# Patient Record
Sex: Male | Born: 1995 | Race: Black or African American | Hispanic: No | Marital: Single | Smoking: Current every day smoker
Health system: Southern US, Community
[De-identification: ages and names within clinical notes are randomized; demographics above are authoritative.]

## PROBLEM LIST (undated history)

## (undated) ENCOUNTER — Emergency Department (HOSPITAL_COMMUNITY): Admission: EM | Payer: Medicaid Other | Source: Home / Self Care

## (undated) DIAGNOSIS — F209 Schizophrenia, unspecified: Secondary | ICD-10-CM

## (undated) HISTORY — PX: WISDOM TOOTH EXTRACTION: SHX21

## (undated) HISTORY — PX: OTHER SURGICAL HISTORY: SHX169

---
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
## 2018-02-11 ENCOUNTER — Emergency Department (HOSPITAL_COMMUNITY): Admission: EM | Admit: 2018-02-11 | Discharge: 2018-02-11 | Discharge status: Against Medical Advice | Payer: Self-pay

## 2018-02-11 ENCOUNTER — Emergency Department (HOSPITAL_COMMUNITY)
Admission: EM | Admit: 2018-02-11 | Discharge: 2018-02-13 | Disposition: A | Discharge status: Other Acute Inpatient Hospital | Payer: Self-pay | Attending: Emergency Medicine | Admitting: Emergency Medicine

## 2018-02-11 ENCOUNTER — Other Ambulatory Visit: Payer: Self-pay

## 2018-02-11 ENCOUNTER — Encounter (HOSPITAL_COMMUNITY): Payer: Self-pay | Admitting: Emergency Medicine

## 2018-02-11 DIAGNOSIS — F29 Unspecified psychosis not due to a substance or known physiological condition: Secondary | ICD-10-CM

## 2018-02-11 LAB — CBC
HEMATOCRIT: 43.7 % (ref 39.0–52.0)
Hemoglobin: 14.3 g/dL (ref 13.0–17.0)
MCH: 26.8 pg (ref 26.0–34.0)
MCHC: 32.7 g/dL (ref 30.0–36.0)
MCV: 82 fL (ref 78.0–100.0)
Platelets: 274 10*3/uL (ref 150–400)
RBC: 5.33 MIL/uL (ref 4.22–5.81)
RDW: 13.2 % (ref 11.5–15.5)
WBC: 5.3 10*3/uL (ref 4.0–10.5)

## 2018-02-11 LAB — RAPID URINE DRUG SCREEN, HOSP PERFORMED
AMPHETAMINES: NOT DETECTED
BARBITURATES: NOT DETECTED
BENZODIAZEPINES: NOT DETECTED
Cocaine: NOT DETECTED
Opiates: NOT DETECTED
Tetrahydrocannabinol: POSITIVE — AB

## 2018-02-11 LAB — COMPREHENSIVE METABOLIC PANEL
ALBUMIN: 4.3 g/dL (ref 3.5–5.0)
ALT: 28 U/L (ref 17–63)
AST: 22 U/L (ref 15–41)
Alkaline Phosphatase: 63 U/L (ref 38–126)
Anion gap: 9 (ref 5–15)
BUN: 15 mg/dL (ref 6–20)
CHLORIDE: 107 mmol/L (ref 101–111)
CO2: 23 mmol/L (ref 22–32)
CREATININE: 2 mg/dL — AB (ref 0.61–1.24)
Calcium: 10 mg/dL (ref 8.9–10.3)
GFR calc non Af Amer: 47 mL/min — ABNORMAL LOW (ref >60)
GFR, EST AFRICAN AMERICAN: 54 mL/min — AB (ref >60)
GLUCOSE: 116 mg/dL — AB (ref 65–99)
Potassium: 3.7 mmol/L (ref 3.5–5.1)
SODIUM: 139 mmol/L (ref 135–145)
Total Bilirubin: 1.9 mg/dL — ABNORMAL HIGH (ref 0.3–1.2)
Total Protein: 8.1 g/dL (ref 6.5–8.1)

## 2018-02-11 LAB — ETHANOL: Alcohol, Ethyl (B): <10 mg/dL (ref <10)

## 2018-02-11 MED ORDER — ONDANSETRON HCL 4 MG PO TABS: 4.0000 mg | ORAL_TABLET | Freq: Three times a day (TID) | ORAL | Status: DC | PRN

## 2018-02-11 MED ORDER — LORAZEPAM 2 MG/ML IJ SOLN
1.0000 mg | INTRAMUSCULAR | Status: AC | PRN
  Administered 2018-02-12: 1 mg via INTRAMUSCULAR
  Filled 2018-02-11: qty 1

## 2018-02-11 MED ORDER — SODIUM CHLORIDE 0.9 % IV BOLUS
1000.0000 mL | Freq: Once | INTRAVENOUS | Status: AC
  Administered 2018-02-12: 1000 mL via INTRAVENOUS

## 2018-02-11 MED ORDER — ALUM & MAG HYDROXIDE-SIMETH 200-200-20 MG/5ML PO SUSP: 30.0000 mL | Freq: Four times a day (QID) | ORAL | Status: DC | PRN

## 2018-02-11 MED ORDER — ACETAMINOPHEN 325 MG PO TABS: 650.0000 mg | ORAL_TABLET | ORAL | Status: DC | PRN

## 2018-02-11 NOTE — ED Provider Notes (Signed)
[facility] EMERGENCY DEPARTMENT Provider Note   CSN: [id number] Arrival date & time: 02/11/18  2137     History   Chief Complaint Chief Complaint  Patient presents with  . Medical Clearance    IVC    HPI Gregory Saunders is a 22 y.o. male with no major medical history presents to the Emergency Department accompanied by his father.  Patient is able to tell me his name but is unable to oriented to time or place.  He is unable to give coherent answers.  When asked why he is here patient begins to ramble.  He states that he has "helped people" but is unable to elaborate.  He states he has been talking to God and feels as if he is being embraced.  He denies alcohol or cigarette usage.  He does report daily marijuana usage.  He denies a change in his drug dealer.  He denies suicidal ideation, homicidal ideation, auditory or visual hallucinations.  5 caveat for psychiatric.   Remainder of the history is provided by father.  Father reports that patient attends [employer].  Father states when he picked his son up from school 2-1/2 weeks ago he was acting normally.  There were no reports of difficulty with this semester at school.  Father reports the last week patient has become evasive, agitated and argumentative.  Father reports the patient is talking to himself and is not making any sense.  He reports that on Monday, the patient took his truck and disappeared for 3 days.  Father reports that patient has been at his girlfriends smoking marijuana.  Father reports that the girlfriend states patient has been talking to himself and acting strangely.  Father denies known psychiatric or medical history.   The history is provided by the patient, medical records and a parent. No language interpreter was used.    History reviewed. No pertinent past medical history.  There are no active problems to display for this patient.   History reviewed. No pertinent surgical  history.      Home Medications    Prior to Admission medications   Not on File    Family History No family history on file.  Social History Social History   Tobacco Use  . Smoking status: Never Smoker  . Smokeless tobacco: Never Used  Substance Use Topics  . Alcohol use: Never    Frequency: Never  . Drug use: Yes    Types: Marijuana     Allergies   Penicillins   Review of Systems Review of Systems  Unable to perform ROS: Psychiatric disorder     Physical Exam Updated Vital Signs BP (!) 152/101 (BP Location: Right Arm)   Pulse 76   Temp 99.1 F (37.3 C) (Oral)   Resp 16   SpO2 100%   Physical Exam  Constitutional: He appears well-developed and well-nourished. No distress.  HENT:  Head: Normocephalic.  Eyes: Conjunctivae are normal. No scleral icterus.  Neck: Normal range of motion.  Cardiovascular: Normal rate and intact distal pulses.  Pulmonary/Chest: Effort normal.  Clear and equal  Abdominal:  Soft and nontender  Musculoskeletal: Normal range of motion. He exhibits no edema.  Neurological: He is alert. GCS eye subscore is 4. GCS verbal subscore is 4. GCS motor subscore is 6.  Patient able to oriented to person alone.  Unable to orient to place, time or event.  Skin: Skin is warm and dry.  Psychiatric: His speech is normal and  behavior is normal. His affect is blunt. He is not actively hallucinating. Cognition and memory are impaired. He expresses no homicidal and no suicidal ideation. He expresses no suicidal plans and no homicidal plans.  Confused answers  Nursing note and vitals reviewed.    ED Treatments / Results  Labs (all labs ordered are listed, but only abnormal results are displayed) Labs Reviewed  COMPREHENSIVE METABOLIC PANEL - Abnormal; Notable for the following components:      Result Value   Glucose, Bld 116 (*)    Creatinine, Ser 2.00 (*)    Total Bilirubin 1.9 (*)    GFR calc non Af Amer 47 (*)    GFR calc Af Amer 54 (*)     All other components within normal limits  RAPID URINE DRUG SCREEN, HOSP PERFORMED - Abnormal; Notable for the following components:   Tetrahydrocannabinol POSITIVE (*)    All other components within normal limits  URINALYSIS, ROUTINE W REFLEX MICROSCOPIC - Abnormal; Notable for the following components:   Color, Urine AMBER (*)    Specific Gravity, Urine 1.036 (*)    Ketones, ur 5 (*)    Protein, ur 30 (*)    All other components within normal limits  ETHANOL  CBC     Procedures Procedures (including critical care time)  Medications Ordered in ED Medications  acetaminophen (TYLENOL) tablet 650 mg (has no administration in time range)  ondansetron (ZOFRAN) tablet 4 mg (has no administration in time range)  alum & mag hydroxide-simeth (MAALOX/MYLANTA) 200-200-20 MG/5ML suspension 30 mL (has no administration in time range)  LORazepam (ATIVAN) injection 1 mg (1 mg Intramuscular Given 02/12/18 0130)  sodium chloride 0.9 % bolus 1,000 mL (0 mLs Intravenous Stopped 02/12/18 0130)  ziprasidone (GEODON) injection 20 mg (20 mg Intramuscular Given 02/12/18 0155)  sterile water (preservative free) injection (1.2 mLs  Given 02/12/18 0155)     Initial Impression / Assessment and Plan / ED Course  I have reviewed the triage vital signs and the nursing notes.  Pertinent labs & imaging results that were available during my care of the patient were reviewed by me and considered in my medical decision making (see chart for details).  Clinical Course as of Feb 12 709  Fri Feb 11, 2018  2316 Elevated.  Unknown baseline  Creatinine(!): 2.00 [HM]  2316 Hypertensive.  No known diagnosis of same.  BP(!): 152/101 [HM]  2316 Elevated.  Abdomen soft and nontender.  No reports of emesis.  Total Bilirubin(!): 1.9 [HM]  2317 No leukocytosis.  WBC: 5.3 [HM]  2345 Discussed with Ala Dach of [facility].  Pt meets inpatient criteria.   [HM]  Sat Feb 12, 2018  0115 Improved  BP(!): 144/96 [HM]  0139 Called to  patient bedside after patient became agitated and aggressive.  He removed his own IV.  Pt given Ativan  without improvement.  Pt with soft restraints placed in my presence and Geodon ordered.     [HM]  0300 Patient is much calmer after Geodon.  Will consider removal of soft restraints.   [HM]    Clinical Course User Index [HM] Arijana Narayan, Boyd Kerbs      Presents with psychiatric complaints.  He is able to orient to person only.  Labs with elevated serum creatinine and increased total bilirubin.  Patient without abdominal pain, nausea or vomiting.  No elevation in AST/ALT.  Doubt cholecystitis, cholangitis.  Patient is hypertensive without history of same.  No elevation in BUN.  No chest pain or  shortness of breath.  Patient is afebrile without psychosis.  No evidence of sirs or sepsis.  Drug screen positive for marijuana.  No alcohol in the patient's system.  Will give fluids.  Patient does meet inpatient criteria for behavioral health.  At this time there are no emergent conditions which would prevent patient from obtaining psychiatric treatment.   Final Clinical Impressions(s) / ED Diagnoses   Final diagnoses:  Psychosis, unspecified psychosis type [facility])    ED Discharge Orders    None       Mardene Sayer Boyd Kerbs 02/12/18 1096    Pricilla Loveless, MD 02/12/18 [phone]

## 2018-02-11 NOTE — ED Triage Notes (Signed)
Brought to ed by parents and [employer].  Per IVC paperwork patient is talking to himself, when asked whey he didn't call his mother states I prayed to God that's how I called her.  Also combative with father trying to take his car keys.  Admits to smoking marijuana everyday last week and this week.  Per [employer] acting peculiar darting eyes back and forth from door as if he is going to run.  Family at the bedside.
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
## 2018-02-12 ENCOUNTER — Other Ambulatory Visit: Payer: Self-pay

## 2018-02-12 LAB — URINALYSIS, ROUTINE W REFLEX MICROSCOPIC
Bacteria, UA: NONE SEEN
Bilirubin Urine: NEGATIVE
Glucose, UA: NEGATIVE mg/dL
HGB URINE DIPSTICK: NEGATIVE
Ketones, ur: 5 mg/dL — AB
LEUKOCYTES UA: NEGATIVE
NITRITE: NEGATIVE
PH: 5 (ref 5.0–8.0)
Protein, ur: 30 mg/dL — AB
SPECIFIC GRAVITY, URINE: 1.036 — AB (ref 1.005–1.030)

## 2018-02-12 MED ORDER — HALOPERIDOL 5 MG PO TABS
10.0000 mg | ORAL_TABLET | Freq: Four times a day (QID) | ORAL | Status: DC | PRN
  Administered 2018-02-12: 10 mg via ORAL
  Administered 2018-02-13: 5 mg via ORAL
  Filled 2018-02-12 (×2): qty 2

## 2018-02-12 MED ORDER — STERILE WATER FOR INJECTION IJ SOLN
INTRAMUSCULAR | Status: AC
  Administered 2018-02-12: 1.2 mL
  Filled 2018-02-12: qty 10

## 2018-02-12 MED ORDER — DIPHENHYDRAMINE HCL 25 MG PO CAPS
25.0000 mg | ORAL_CAPSULE | Freq: Four times a day (QID) | ORAL | Status: DC | PRN
  Administered 2018-02-12 – 2018-02-13 (×3): 25 mg via ORAL
  Filled 2018-02-12 (×3): qty 1

## 2018-02-12 MED ORDER — HALOPERIDOL LACTATE 5 MG/ML IJ SOLN
5.0000 mg | Freq: Once | INTRAMUSCULAR | Status: AC
  Administered 2018-02-12: 5 mg via INTRAMUSCULAR
  Filled 2018-02-12: qty 1

## 2018-02-12 MED ORDER — LORAZEPAM 2 MG/ML IJ SOLN
2.0000 mg | Freq: Four times a day (QID) | INTRAMUSCULAR | Status: DC | PRN
  Administered 2018-02-13: 2 mg via INTRAMUSCULAR
  Filled 2018-02-12: qty 1

## 2018-02-12 MED ORDER — OLANZAPINE 5 MG PO TABS
5.0000 mg | ORAL_TABLET | Freq: Every day | ORAL | Status: DC
  Administered 2018-02-13: 5 mg via ORAL
  Filled 2018-02-12: qty 1

## 2018-02-12 MED ORDER — LORAZEPAM 1 MG PO TABS
2.0000 mg | ORAL_TABLET | Freq: Four times a day (QID) | ORAL | Status: DC | PRN
  Administered 2018-02-12: 2 mg via ORAL
  Administered 2018-02-13: 1 mg via ORAL
  Filled 2018-02-12 (×2): qty 2

## 2018-02-12 MED ORDER — ZIPRASIDONE MESYLATE 20 MG IM SOLR
20.0000 mg | Freq: Once | INTRAMUSCULAR | Status: AC
  Administered 2018-02-12: 20 mg via INTRAMUSCULAR
  Filled 2018-02-12: qty 20

## 2018-02-12 MED ORDER — LORAZEPAM 2 MG/ML IJ SOLN
2.0000 mg | Freq: Once | INTRAMUSCULAR | Status: AC
  Administered 2018-02-12: 2 mg via INTRAMUSCULAR
  Filled 2018-02-12: qty 1

## 2018-02-12 MED ORDER — HALOPERIDOL LACTATE 5 MG/ML IJ SOLN
10.0000 mg | Freq: Four times a day (QID) | INTRAMUSCULAR | Status: DC | PRN
  Administered 2018-02-13: 10 mg via INTRAMUSCULAR
  Filled 2018-02-12: qty 2

## 2018-02-12 MED ORDER — DIPHENHYDRAMINE HCL 50 MG/ML IJ SOLN: 25.0000 mg | Freq: Four times a day (QID) | INTRAMUSCULAR | Status: DC | PRN

## 2018-02-12 NOTE — ED Notes (Signed)
Gregory Saunders (Mother) [phone]

## 2018-02-12 NOTE — ED Notes (Signed)
Pt mother came to visit. Mother informed pt is sleeping at the time and has been sedated. She is allowed to go into the room however is not allowed to wake pt. Mother verbalized understanding. Mother explained the pt is to only call her or the father and staff should dial the number.  In regards to placement, mother endorses placement anywhere in the state. She says " I want my son to be in the right place getting the right treatment. "   Mother cell: [phone] Father cell: [phone]  The mother requests to please keep her informed.

## 2018-02-12 NOTE — ED Notes (Signed)
Called dinner order in to [facility].

## 2018-02-12 NOTE — BH Assessment (Addendum)
Writer reassessed patient this am. He was pleasant but not oriented. He knew his birthday but did not know month or date. He says he slept well and ate breakfast. Pt says, "I got my energy back." He shakes his head no when asked if he is suicidal. He answers " no" when asked if he is homicidal. Pt's replies don't match the writer's questions.  He asks Clinical research associate when can he leave and when will his clothes be returned.  Per Assunta Found, NP pt continues to meet inpatient MH treatment. Rankin NP recommends starting pt asap on Zyprexa Zydis 5 mg qhs. He will also need a EKG to determine baseline.  Writer notified pt's Child psychotherapist. TTS seeking placement.  Evette Cristal, [place] Therapeutic Triage Specialist

## 2018-02-12 NOTE — ED Notes (Signed)
Pt attempting to leave ED - Dr Charm Barges aware - pt refusing to return to room even after much encouragement from Security and Off-Duty [employer]. Orders received for Haldol  IM and Ativan  IM. When pt did return to room, tolerated injections well. Refused for them to be placed anywhere except deltoids. Pt continues to state he wants to leave and attempting to leave room.
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
## 2018-02-12 NOTE — ED Provider Notes (Signed)
Received a call about Gregory Saunders patient in pod F who is on an IVC with psychiatric bed search for new onset delusional behavior.  He had received Geodon at around 9 this morning but he again is becoming agitated and restless.  I have ordered some Haldol and Ativan IM.    Terrilee Files, MD 02/14/18 1044

## 2018-02-12 NOTE — ED Notes (Signed)
Pt continued to attempt to leave. In addition to security and Off-Duty [employer], 2 [employer] Officers assisting w/de-escalating pt. Pt returned to room and talked w/Officer. Pt now lying on bed - resting quietly - eyes closed. Respirations even, unlabored.

## 2018-02-12 NOTE — ED Notes (Signed)
Tamika, NP, [facility] - called w/med recommendations - Haldol  po or IM, Ativan  po or IM, and Benadryl  po or IM every 6 hours prn - agitation. Dr Charm Barges aware and entered orders.

## 2018-02-12 NOTE — ED Notes (Signed)
Pt now awake - continues to state he wants to leave. Encouraged pt to be calm, cooperative. Warm blankets, juice, and sandwich given.
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
## 2018-02-12 NOTE — ED Notes (Signed)
Pt awake - ate few bites of lunch and drank Sprite. Encouraged pt to eat more - declined. Pt refusing to stay in room.

## 2018-02-12 NOTE — ED Notes (Signed)
Pt's mother called inquiring about pt. Voiced understanding of visitation times.

## 2018-02-12 NOTE — ED Notes (Signed)
RN spoke with patient mother to inform of patient placed in restraints and meds given to help control patient's violent behavior; Mother states that patient has had some aggressive behavior at home and feels he can do what he wants; mother states patient behavior is not normal for him and feels he is confused; Mother states patient admits to taking CBC oil and smoking weed this week-Monique,RN

## 2018-02-12 NOTE — ED Notes (Signed)
Patient's family wanted it noted in patient chart that he also admitted to taking CB4 (spice)-Monique,RN

## 2018-02-12 NOTE — ED Notes (Signed)
Re-TTS being performed.  

## 2018-02-12 NOTE — ED Notes (Addendum)
Pt attempted to leave ED - was escorted back to room w/much encouragement from staff, Security, and Off-Duty [employer]. Dr Freida Busman aware - order received for Geodon  IM. Pt sat on bed - tolerated well.

## 2018-02-12 NOTE — Progress Notes (Signed)
Patient meets criteria for inpatient treatment. There are currently no appropriate beds available at [facility] per [place], [facility]. CSW faxed referrals to the following facilities for review.  [place], [address], [place], [address], [address], [place], [address] [place], [place], [place], [place], [address], [place], [place], [place], [place], [place], and [place].   TTS will continue to seek bed placement.     Moss Mc, MSW, LCSW-A, LCAS-A 02/12/2018 12:15 PM

## 2018-02-12 NOTE — ED Notes (Signed)
ED Provider at bedside to assess patient combativness-Monique,RN

## 2018-02-12 NOTE — ED Notes (Signed)
Harle Battiest, pt's mother sts she wanted to check on pt. Informed her pt is asleep.

## 2018-02-12 NOTE — ED Notes (Signed)
Pt ambulated to nurses' desk and looking out door to Pod A. Requested for pt to return to room. Pt continued to stand at desk. Pt given magazines. Pt took them to his room then returned them to nurse.

## 2018-02-12 NOTE — ED Notes (Addendum)
Pt awake now - pt noted to be standing in hallway attempting to tie his torn shirt. Sitter and RN offered pt new shirt. Pt changed. Pt states he remembers everything that occurred last pm - states "I know they were trying to hold me down and I wasn't going to let them". Pt noted to be laughing. 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 States he was brought to ED d/t "they said I was having delusions". Denies experiencing any this am nor AH/VH. Pt noted to be alert, oriented x 4. Pt attempts to stand in hallway - redirected to room. Pt noted to be compliant. Pt sitting on chair in room talking w/Sitter. Pt noted w/excessive speech and appears to be slow to respond.

## 2018-02-13 MED ORDER — OLANZAPINE 5 MG PO TABS: 5.0000 mg | ORAL_TABLET | Freq: Every day | ORAL | Status: DC

## 2018-02-13 MED ORDER — OLANZAPINE 5 MG PO TABS: 7.5000 mg | ORAL_TABLET | Freq: Every day | ORAL | Status: DC

## 2018-02-13 MED ORDER — OLANZAPINE 5 MG PO TABS
5.0000 mg | ORAL_TABLET | Freq: Every day | ORAL | Status: DC
  Administered 2018-02-13: 5 mg via ORAL
  Filled 2018-02-13: qty 1
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
## 2018-02-13 NOTE — ED Notes (Signed)
NO belongings in ED for pt - verified w/mother she took them upon arrival to ED.

## 2018-02-13 NOTE — ED Notes (Addendum)
Pt returned to room after much encouragement. Pt ate breakfast and is now eating sandwich. Pt had hugged this RN. Stating "I just want to give you a hug". Advised pt RN and other staff prefer "bump fists" - pt then bumped fists w/RN. Pt had then approached Sitter in a quicker manner attempting to hug her. Sitter backed up and advised pt to please not touch her. RN asked pt to "bump fists". Pt then did this w/Sitter. Pt then sat in Sitter's chair outside of his room and attempted to type on computer. RN gave pt sandwich - pt then got up and sat on his bed and began eating.

## 2018-02-13 NOTE — ED Notes (Signed)
[employer] present to take pt to [facility] - Pt attempting to take soiled linen bag w/him. Security assisting w/removing from pt.
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
## 2018-02-13 NOTE — ED Notes (Signed)
Attempted to call report to [facility] - [phone] - pt accepted by Dr Otelia Santee. Requested for this RN to call back in 10 minutes.

## 2018-02-13 NOTE — Consult Note (Signed)
  Spoke with Kriste Basque, RN who reported that patient continues to be combative and no improvement in psychosis.  Current medications: Haldol 10 mg (PO/IM) with Ativan 2 mg (PO/IM) and Benadryl 25 mg (PO/IM) Q 6 hr prn agitation Zyprexa 5 mg Q hs Changes made to Zyprexa:  Zyprexa 5 mg Q morning and 7.5 mg Q hs.   Shuvon B. Rankin, NP

## 2018-02-13 NOTE — ED Notes (Signed)
Male Engineer, materials @ bedside, pt constantly attempting to engage her in conversation, she did not speak to pt. Pt decided to give her a hug and officer seemed to be in distress but did not make any attempts to tell him to not touch her.

## 2018-02-13 NOTE — ED Notes (Signed)
Pt Lunch order placed. 

## 2018-02-13 NOTE — Progress Notes (Signed)
Patient has been accepted to [facility]. The reporting number is [id number]. The accepting doctor is Dr. Malvin Johns. They can accept him before 2 PM or after 3:30 PM. CSW has notified patient's nurse Becky. CSW has faxed the IVC paperwork and facesheet to [facility].  Moss Mc MSW, LCSW-A, LCAS-A Clinical Social Worker 02/13/2018 12:36 PM

## 2018-02-13 NOTE — ED Notes (Signed)
Pt in shower. Pt had asked Sitter to remain in shower w/him. Advised he may shower and Sitter will stand by door.

## 2018-02-13 NOTE — Progress Notes (Signed)
Patient meets criteria for inpatient treatment. There are currently no appropriate beds available at [facility] per [place]. CSW faxed referrals to the following facilities for review.  [place], [address], [place], [address], [address], [place], [address], [place], [place], [place], [address], [place], [place], [place], [place], [place], and [place].   TTS will continue to seek bed placement.   Moss Mc MSW, LCSW-A, LCAS-A Clinical Social Worker 02/13/2018 8:02 AM

## 2018-02-13 NOTE — ED Notes (Signed)
Pt awake - ambulated to nurses' desk - refusing to return to room. Offered for pt to eat his breakfast. States he will but refuses to go back to room. Shuvon, NP, [facility], aware - order received for Zyprexa  daily and Zyprexa 7.5mg  qhs. Pt called his mother - stating he wants to leave now. Security and [employer] present. Pt talking w/Security. Pt took Zyprexa po w/o difficulty.

## 2018-02-13 NOTE — ED Notes (Signed)
RN called pt's mother, Martie Lee - [phone] - and advised pt has been accepted to [facility].

## 2018-02-13 NOTE — ED Notes (Signed)
Pt given snack. 

## 2018-08-24 ENCOUNTER — Emergency Department
Admission: EM | Admit: 2018-08-24 | Discharge: 2018-08-24 | Disposition: A | Discharge status: Home / Self Care | Payer: Self-pay | Attending: Emergency Medicine | Admitting: Emergency Medicine

## 2018-08-24 ENCOUNTER — Other Ambulatory Visit: Payer: Self-pay

## 2018-08-24 DIAGNOSIS — Z7689 Persons encountering health services in other specified circumstances: Secondary | ICD-10-CM | POA: Insufficient documentation

## 2018-08-24 MED ORDER — PALIPERIDONE PALMITATE ER 156 MG/ML IM SUSY
156.0000 mg | PREFILLED_SYRINGE | Freq: Once | INTRAMUSCULAR | Status: AC
  Administered 2018-08-24: 156 mg via INTRAMUSCULAR
  Filled 2018-08-24: qty 1

## 2018-08-24 NOTE — ED Provider Notes (Signed)
[facility] Emergency Department Provider Note  Time seen: 10:09 PM  I have reviewed the triage vital signs and the nursing notes.   HISTORY  Chief Complaint Psychiatric Evaluation    HPI Gregory Saunders is a 22 y.o. male with a past medical history of possible schizophrenia presents to the emergency department for psychiatric evaluation.  I spoke to mom first who states that the patient has been living in [place] going to school, has been calling and texting people estranged hours of the night and has not been sleeping very much.  Mom confronted the patient about this and the patient admitted to stopping his medication several weeks ago because he did not like how it made him feel.  Mom picked the patient from [place] and brought him to the emergency department after speaking [facility].  Patient is followed [facility] but they will not be reopened until Monday, and mom felt that he needed his medication quicker than that.  Mom does admit that the patient is very compliant at this time with her request, states in May the patient became extremely erratic had to be IVC.  Currently mom states if the patient could just get his medication she would be happy and will take him home and have him follow-up with [facility] on Monday.  I discussed with the patient he has no SI, no HI, denies any recreational drugs or alcohol use.  Has no medical concerns.  Patient admits to not taking his medication.  States he only takes fish oil because it is natural.  Denies any auditory visual hallucinations.   No past medical history on file.  There are no active problems to display for this patient.   No past surgical history on file.  Prior to Admission medications   Not on File    Allergies  Allergen Reactions  . Penicillins Other (See Comments)    Unknown     No family history on file.  Social History Social History   Tobacco Use  . Smoking status: Never Smoker  . Smokeless  tobacco: Never Used  Substance Use Topics  . Alcohol use: Never    Frequency: Never  . Drug use: Yes    Types: Marijuana    Review of Systems Constitutional: Negative for fever. Cardiovascular: Negative for chest pain. Respiratory: Negative for shortness of breath. Gastrointestinal: Negative for abdominal pain Genitourinary: Negative for urinary compaints Musculoskeletal: Negative for musculoskeletal complaints Skin: Negative for skin complaints  Neurological: Negative for headache All other ROS negative  ____________________________________________   PHYSICAL EXAM:  VITAL SIGNS: ED Triage Vitals  Enc Vitals Group     BP 08/24/18 2132 (!) 160/106     Pulse Rate 08/24/18 2132 (!) 105     Resp 08/24/18 2132 20     Temp 08/24/18 2132 98.6 F (37 C)     Temp Source 08/24/18 2132 Oral     SpO2 08/24/18 2132 100 %     Weight 08/24/18 2133 250 lb (113.4 kg)     Height --      Head Circumference --      Peak Flow --      Pain Score 08/24/18 2132 0     Pain Loc --      Pain Edu? --      Excl. in GC? --     Constitutional: Alert and oriented. Well appearing and in no distress. Eyes: Normal exam ENT   Head: Normocephalic and atraumatic.   Mouth/Throat:  Mucous membranes are moist. Cardiovascular: Normal rate, regular rhythm. No murmur Respiratory: Normal respiratory effort without tachypnea nor retractions. Breath sounds are clear  Gastrointestinal: Soft and nontender. No distention Musculoskeletal: Nontender with normal range of motion in all extremities.  Neurologic:  Normal speech and language. No gross focal neurologic deficits Skin:  Skin is warm, dry and intact.  Psychiatric: Mood and affect are normal.   ____________________________________________  INITIAL IMPRESSION / ASSESSMENT AND PLAN / ED COURSE  Pertinent labs & imaging results that were available during my care of the patient were reviewed by me and considered in my medical decision making (see  chart for details).  Patient presents to the emergency department after not taking medication.  Mom states in May he was diagnosed with a psychotic disorder possible schizophrenia.  Was doing well on in [place] but switched to a oral form of the medication and has since stopped taking the medicine.  Mom is concerned that his symptoms could be returning as he has been off his medication.  Currently he denies any SI or HI, denies hallucinations is calm and cooperative does not appear to meet IVC criteria.  I did discuss with the patient taking his medication following up with [facility].  Patient is agreeable to take the injection of the medicine and will follow up with [facility] on Monday per mom.  Mom is agreeable to take the patient home after he receives his medication.  They are to follow-up with [facility] do not believe further psychiatric evaluation is required at this time.  Mom is agreeable.  Patient willingly took his injection we will discharge from the emergency department in care of his parents.  ____________________________________________   FINAL CLINICAL IMPRESSION(S) / ED DIAGNOSES  Psychiatric evaluation    Minna AntisPaduchowski, Beatrix Breece, MD 08/24/18 2242

## 2018-08-24 NOTE — ED Triage Notes (Signed)
Pt brought in for psychotic behavior states "my mother is a vampire", mother brought him for invega shot.

## 2018-08-24 NOTE — ED Notes (Signed)
Patient discharge home with parents. Patient alert and oriented x 4. Patient denies Si/HI/AVH. Patient signed discharge paperwork and voiced understanding.

## 2018-08-24 NOTE — ED Notes (Signed)
Patient in room 22. Patient denies SI/HI. Patient when first asked about hearing voices patient states no just good voices and then denies hearing them when asked to elaborate. Patient states he knows mom is a vampire and states because she birthed him. Patient states he has not been taking his medication stating it made him to sleepy.

## 2018-10-22 ENCOUNTER — Encounter (HOSPITAL_COMMUNITY): Payer: Self-pay | Admitting: Emergency Medicine

## 2018-10-22 ENCOUNTER — Other Ambulatory Visit: Payer: Self-pay

## 2018-10-22 ENCOUNTER — Emergency Department (HOSPITAL_COMMUNITY)
Admission: EM | Admit: 2018-10-22 | Discharge: 2018-10-27 | Disposition: A | Discharge status: Other Acute Inpatient Hospital | Payer: BC Managed Care – PPO | Attending: Emergency Medicine | Admitting: Emergency Medicine

## 2018-10-22 DIAGNOSIS — F203 Undifferentiated schizophrenia: Secondary | ICD-10-CM | POA: Diagnosis not present

## 2018-10-22 DIAGNOSIS — F17210 Nicotine dependence, cigarettes, uncomplicated: Secondary | ICD-10-CM | POA: Diagnosis not present

## 2018-10-22 DIAGNOSIS — Z046 Encounter for general psychiatric examination, requested by authority: Secondary | ICD-10-CM | POA: Insufficient documentation

## 2018-10-22 DIAGNOSIS — R440 Auditory hallucinations: Secondary | ICD-10-CM | POA: Diagnosis present

## 2018-10-22 DIAGNOSIS — F29 Unspecified psychosis not due to a substance or known physiological condition: Secondary | ICD-10-CM

## 2018-10-22 HISTORY — DX: Schizophrenia, unspecified: F20.9

## 2018-10-22 LAB — RAPID URINE DRUG SCREEN, HOSP PERFORMED
Amphetamines: NOT DETECTED
BENZODIAZEPINES: NOT DETECTED
Barbiturates: NOT DETECTED
Cocaine: NOT DETECTED
Opiates: NOT DETECTED
TETRAHYDROCANNABINOL: POSITIVE — AB

## 2018-10-22 LAB — COMPREHENSIVE METABOLIC PANEL
ALT: 25 U/L (ref 0–44)
AST: 19 U/L (ref 15–41)
Albumin: 4.1 g/dL (ref 3.5–5.0)
Alkaline Phosphatase: 56 U/L (ref 38–126)
Anion gap: 9 (ref 5–15)
BUN: 16 mg/dL (ref 6–20)
CO2: 23 mmol/L (ref 22–32)
Calcium: 9.3 mg/dL (ref 8.9–10.3)
Chloride: 104 mmol/L (ref 98–111)
Creatinine, Ser: 1.21 mg/dL (ref 0.61–1.24)
GFR calc Af Amer: >60 mL/min (ref >60)
GFR calc non Af Amer: >60 mL/min (ref >60)
Glucose, Bld: 102 mg/dL — ABNORMAL HIGH (ref 70–99)
Potassium: 3.4 mmol/L — ABNORMAL LOW (ref 3.5–5.1)
SODIUM: 136 mmol/L (ref 135–145)
Total Bilirubin: 0.9 mg/dL (ref 0.3–1.2)
Total Protein: 8.1 g/dL (ref 6.5–8.1)

## 2018-10-22 LAB — SALICYLATE LEVEL

## 2018-10-22 LAB — CBC
HCT: 43.3 % (ref 39.0–52.0)
Hemoglobin: 13.9 g/dL (ref 13.0–17.0)
MCH: 27.6 pg (ref 26.0–34.0)
MCHC: 32.1 g/dL (ref 30.0–36.0)
MCV: 86.1 fL (ref 80.0–100.0)
Platelets: 239 10*3/uL (ref 150–400)
RBC: 5.03 MIL/uL (ref 4.22–5.81)
RDW: 13.4 % (ref 11.5–15.5)
WBC: 5.4 10*3/uL (ref 4.0–10.5)
nRBC: 0 % (ref 0.0–0.2)

## 2018-10-22 LAB — ETHANOL: Alcohol, Ethyl (B): <10 mg/dL (ref <10)

## 2018-10-22 LAB — ACETAMINOPHEN LEVEL: Acetaminophen (Tylenol), Serum: <10 ug/mL — ABNORMAL LOW (ref 10–30)

## 2018-10-22 MED ORDER — ZIPRASIDONE HCL 20 MG PO CAPS
ORAL_CAPSULE | ORAL | Status: AC
  Administered 2018-10-22: 20 mg
  Filled 2018-10-22: qty 1

## 2018-10-22 MED ORDER — STERILE WATER FOR INJECTION IJ SOLN
INTRAMUSCULAR | Status: AC
  Filled 2018-10-22: qty 10

## 2018-10-22 MED ORDER — ZIPRASIDONE MESYLATE 20 MG IM SOLR
20.0000 mg | Freq: Once | INTRAMUSCULAR | Status: DC
  Filled 2018-10-22: qty 20

## 2018-10-22 NOTE — ED Provider Notes (Signed)
[facility] EMERGENCY DEPARTMENT Provider Note   CSN: [id number] Arrival date & time: 10/22/18  1528     History   Chief Complaint Chief Complaint  Patient presents with  . V70.1  . Psychiatric Evaluation  . Hallucinations    HPI Gregory Saunders is a 23 y.o. male.  He is brought in by please officer on an IVC.  Per the IVC the patient is hearing voices and has threatened to harm his mother and his girlfriend.  The patient himself denies any medical complaints and denies any suicidal or homicidal ideations.  He says he does hear spiritual direction but it does not tell him to do anything harmful.  He states he is a schizophrenic and gets his monthly injections.  He denies any medical complaints.  The history is provided by the patient.  Mental Health Problem  Presenting symptoms: hallucinations   Presenting symptoms: no aggressive behavior, no disorganized speech, no disorganized thought process, no homicidal ideas, no suicidal thoughts, no suicidal threats and no suicide attempt   Patient accompanied by:  Law enforcement Degree of incapacity (severity):  Mild Onset quality:  Unable to specify Timing:  Intermittent Progression:  Unchanged Chronicity:  Chronic Context: medication   Context: not alcohol use, not drug abuse, not noncompliant and not recent medication change   Treatment compliance:  Unable to specify Associated symptoms: no abdominal pain, no chest pain, no headaches and no poor judgment   Risk factors: hx of mental illness     Past Medical History:  Diagnosis Date  . Schizophrenia (HCC)     There are no active problems to display for this patient.   Past Surgical History:  Procedure Laterality Date  . stye removal    . WISDOM TOOTH EXTRACTION          Home Medications    Prior to Admission medications   Not on File    Family History History reviewed. No pertinent family history.  Social History Social History   Tobacco Use  . Smoking  status: Current Every Day Smoker    Packs/day: 0.50  . Smokeless tobacco: Never Used  Substance Use Topics  . Alcohol use: Never    Frequency: Never  . Drug use: Not Currently    Types: Marijuana     Allergies   Amoxicillin and Penicillins   Review of Systems Review of Systems  Constitutional: Negative for fever.  HENT: Negative for sore throat.   Eyes: Negative for visual disturbance.  Respiratory: Negative for shortness of breath.   Cardiovascular: Negative for chest pain.  Gastrointestinal: Negative for abdominal pain.  Genitourinary: Negative for dysuria.  Musculoskeletal: Negative for neck pain.  Skin: Negative for rash.  Neurological: Negative for headaches.  Psychiatric/Behavioral: Positive for hallucinations. Negative for homicidal ideas and suicidal ideas.     Physical Exam Updated Vital Signs BP (!) 162/109 (BP Location: Right Arm)   Pulse 87   Temp 98.2 F (36.8 C) (Temporal)   Resp 20   Ht 6\' 1"  (1.854 m)   Wt 124.7 kg   SpO2 100%   BMI 36.28 kg/m   Physical Exam Vitals signs and nursing note reviewed.  Constitutional:      Appearance: He is well-developed.  HENT:     Head: Normocephalic and atraumatic.  Eyes:     Conjunctiva/sclera: Conjunctivae normal.  Neck:     Musculoskeletal: Neck supple.  Cardiovascular:     Rate and Rhythm: Normal rate and regular rhythm.  Heart sounds: No murmur.  Pulmonary:     Effort: Pulmonary effort is normal. No respiratory distress.     Breath sounds: Normal breath sounds.  Abdominal:     Palpations: Abdomen is soft.     Tenderness: There is no abdominal tenderness.  Musculoskeletal:        General: No tenderness or signs of injury.  Skin:    General: Skin is warm and dry.  Neurological:     General: No focal deficit present.     Mental Status: He is alert and oriented to person, place, and time.     Sensory: No sensory deficit.     Motor: No weakness.  Psychiatric:        Mood and Affect: Mood  normal.        Behavior: Behavior normal.        Judgment: Judgment normal.      ED Treatments / Results  Labs (all labs ordered are listed, but only abnormal results are displayed) Labs Reviewed  COMPREHENSIVE METABOLIC PANEL - Abnormal; Notable for the following components:      Result Value   Potassium 3.4 (*)    Glucose, Bld 102 (*)    All other components within normal limits  ACETAMINOPHEN LEVEL - Abnormal; Notable for the following components:   Acetaminophen (Tylenol), Serum <10 (*)    All other components within normal limits  RAPID URINE DRUG SCREEN, HOSP PERFORMED - Abnormal; Notable for the following components:   Tetrahydrocannabinol POSITIVE (*)    All other components within normal limits  ETHANOL  SALICYLATE LEVEL  CBC    EKG None  Radiology No results found.  Procedures Procedures (including critical care time)  Medications Ordered in ED Medications  ziprasidone (GEODON) injection 20 mg (20 mg Intramuscular Not Given 10/22/18 1847)  sterile water (preservative free) injection (  Not Given 10/22/18 1901)  ziprasidone (GEODON) 20 MG capsule (20 mg  Given 10/22/18 1846)     Initial Impression / Assessment and Plan / ED Course  I have reviewed the triage vital signs and the nursing notes.  Pertinent labs & imaging results that were available during my care of the patient were reviewed by me and considered in my medical decision making (see chart for details).  Clinical Course as of Oct 22 2049  Sat Oct 22, 2018  1700 Have evaluated the patient and I do not find him aggressive or particularly incapacitated by his auditory hallucinations.  I am holding off on signing the first examination until behavioral health does their evaluation.   [MB]  1819 Patient has been evaluated by behavioral health.  They feel he should be kept here until placed.  The patient is not very happy to be here in this and is becoming more agitated.  I finished the first examination  and if ordered some IM Geodon, as he is refusing any oral medication.   [MB]    Clinical Course User Index [MB] Terrilee Files, MD     Final Clinical Impressions(s) / ED Diagnoses   Final diagnoses:  Psychosis, unspecified psychosis type [facility])    ED Discharge Orders    None       Terrilee Files, MD 10/22/18 2051

## 2018-10-22 NOTE — ED Notes (Signed)
Pt still pacing around in room, [employer] in hallway.

## 2018-10-22 NOTE — ED Notes (Signed)
Pt ambulated to restroom with sitter and [employer] at side.

## 2018-10-22 NOTE — ED Notes (Signed)
Pt Is pacing in room and coming out into hall. Redirected to room

## 2018-10-22 NOTE — ED Triage Notes (Signed)
Pt is brought in by [employer] for IVC, per pt denies SI/HI, states his mother is "setting him up". Per IVC papers pt's mother is stating that he is homicidal and having delusions. Pt is calm and cooperative at this time.

## 2018-10-22 NOTE — BH Assessment (Signed)
Tele Assessment Note   Patient Name: Gregory Saunders MRN: [id number] Referring Physician: Meridee Score Location of Patient: [facility] Location of Provider: Behavioral Health TTS Department  AVANISH WIEMKEN is an 23 y.o. male who was brought to [facility] on IVC petitioned by his mother for threatening behavior, altered mental status with psychosis.  Patient states that "there is nothing worng with me."  IVC paperwork states that patient threatened his girlfriend and his mother, but patient denies this.  Patient states that he is hearing spiritual voices and prayers, but they are not hallucinations.  Patient denies any history of SI or substance abuse.  Patient states that he goes to [facility] in [place] for outpatient services and that he has been compliant with his injection and states that he had his last injection 4 weeks ago. Patient states, "You all are trying to kill me by injecting poison in my veins." Patient states that he is currently sleeping 4-6 hours per night and his appetite is good.  Patient denies any access to weapons and he denies any history of self-mutilation or abuse.  TTS spoke to patient's mother, Fremon Ohlman ([phone]) Mother states that patient does well until he smokes marijuana and when he does, he becomes increasingly psychotic  Mother states that patient also missed his Invega Injection on Thursdsay.  She states that patient has been on social media a lot and has been calling for witches and vampires and he states that people are talking to him from [employer] asking for his prayer to get them out.  Mother states that patient had to withdraw from college due to his mental illness.  She states that he was also arrested on a marijuana charge while in school.  Mother tried to address patient yesterday with his need to go to the hospital and patient threatened her for the fist time and told her that if she had him committed that he would kill her.  Patient for some reason also threatened  to go to [place] to kill his girlfriend and mother states that his girlfriend stayed up all night and had a butcher knife because she fears him.  Patient also stated that he would choke someone out if they tried to put him on a psych unit.  Mother states that patient has been chatting with strangers on the internet and bringing them to her house, he has been staying up all night.  Mother states that patient currently has no judgment or insight.  She states that the family is trying to get guardianship over him.  Patient presented as alert and oriented x 3.  Patient was somewhat agitated with potential for labile behavior.  Patient had little insight or judgment when he was assessed.  His psycho-motor activity was normal during the assessment, but his RN called and reported that he was "amping" up and was going to have to be chemically sedated.  Patient's speech was clear and his eye contact was good.  Patient's thoughts were mostly organized, but he was a bit delusional.  His memory was intact.  Patient admitted to hearing voices.  Patient was dressed neatly and appeared to be very clean.    Diagnosis:  F20.3 Schizophrenia Undifferientiated  Past Medical History:  Past Medical History:  Diagnosis Date  . Schizophrenia [facility])     Past Surgical History:  Procedure Laterality Date  . stye removal    . WISDOM TOOTH EXTRACTION      Family History: History reviewed. No pertinent family history.  Social History:  reports that he has been smoking. He has been smoking about 0.50 packs per day. He has never used smokeless tobacco. He reports previous drug use. Drug: Marijuana. He reports that he does not drink alcohol.  Additional Social History:  Alcohol / Drug Use Pain Medications: see MAR Prescriptions: see MAR Over the Counter: see MAR History of alcohol / drug use?: Yes Longest period of sobriety (when/how long): Patient has no significant period of sobriety Negative Consequences of  Use: Financial, Legal, Personal relationships, Work / School Substance #1 Name of Substance 1: marijuana 1 - Age of First Use: not assessed 1 - Amount (size/oz): 1 blunt daily 1 - Frequency: daily 1 - Duration: since onset 1 - Last Use / Amount: patient denies use, but mother states that he has continued to use marijuana  CIWA: CIWA-Ar BP: (!) 162/109 Pulse Rate: 87 COWS:    Allergies:  Allergies  Allergen Reactions  . Amoxicillin     Childhood, possible rash   . Penicillins Other (See Comments)    Unknown     Home Medications: (Not in a hospital admission)   OB/GYN Status:  No LMP for male patient.  General Assessment Data Location of Assessment: [facility] TTS Assessment: In system Is this a Tele or Face-to-Face Assessment?: Tele Assessment Is this an Initial Assessment or a Re-assessment for this encounter?: Initial Assessment Patient Accompanied by:: Other(police, patient is on IVC) Language Other than English: No Living Arrangements: Other (Comment)(lives with parents) What gender do you identify as?: Male Marital status: Single Living Arrangements: Parent Can pt return to current living arrangement?: Yes Admission Status: Involuntary Petitioner: Family member(mother) Is patient capable of signing voluntary admission?: No(patient is psychotic) Referral Source: Self/Family/Friend Insurance type: (self-pay, pending Medicaid)     Crisis Care Plan Living Arrangements: Parent Legal Guardian: Other:(self, but family seeking guardianship) Name of Psychiatrist: [facility] Name of Therapist: [facility]  Education Status Is patient currently in school?: No Is the patient employed, unemployed or receiving disability?: (family attempting to get disability)  Risk to self with the past 6 months Suicidal Ideation: No Has patient been a risk to self within the past 6 months prior to admission? : No Suicidal Intent: No Has patient had any suicidal intent within  the past 6 months prior to admission? : No Is patient at risk for suicide?: No Suicidal Plan?: No Has patient had any suicidal plan within the past 6 months prior to admission? : No Access to Means: No What has been your use of drugs/alcohol within the last 12 months?: daily THC use Previous Attempts/Gestures: No How many times?: 0 Other Self Harm Risks: (none) Triggers for Past Attempts: None known Intentional Self Injurious Behavior: None Family Suicide History: No Recent stressful life event(s): Other (Comment)(had to quit school due to mental illness) Persecutory voices/beliefs?: Yes Depression: No Substance abuse history and/or treatment for substance abuse?: Yes Suicide prevention information given to non-admitted patients: Not applicable  Risk to Others within the past 6 months Homicidal Ideation: Yes-Currently Present Does patient have any lifetime risk of violence toward others beyond the six months prior to admission? : No Thoughts of Harm to Others: Yes-Currently Present Comment - Thoughts of Harm to Others: (patient threatened to kill his mother and girlfriend) Current Homicidal Intent: No Current Homicidal Plan: No Describe Current Homicidal Plan: (none reported) Access to Homicidal Means: No Identified Victim: (mother and girlfriend) History of harm to others?: No Assessment of Violence: None Noted Violent Behavior Description: (  none) Does patient have access to weapons?: No Criminal Charges Pending?: No Does patient have a court date: No Is patient on probation?: No  Psychosis Hallucinations: Auditory Delusions: (religious delusions)  Mental Status Report Appearance/Hygiene: Unremarkable Eye Contact: Good Motor Activity: Unremarkable Speech: Unremarkable Level of Consciousness: Alert Mood: (agitated) Affect: Appropriate to circumstance Anxiety Level: None Thought Processes: Coherent, Relevant Judgement: Impaired Orientation: Person, Place,  Time Obsessive Compulsive Thoughts/Behaviors: Moderate(related to his drug use)  Cognitive Functioning Concentration: Decreased(cannot multi-task) Memory: Recent Intact, Remote Intact Is patient IDD: No Insight: Poor Impulse Control: Poor Appetite: Good Have you had any weight changes? : No Change Sleep: No Change Total Hours of Sleep: 6 Vegetative Symptoms: None  ADLScreening [facility] Assessment Services) Patient's cognitive ability adequate to safely complete daily activities?: Yes Patient able to express need for assistance with ADLs?: Yes Independently performs ADLs?: Yes (appropriate for developmental age)  Prior Inpatient Therapy Prior Inpatient Therapy: Yes Prior Therapy Dates: 01/2018 Prior Therapy Facilty/Provider(s): [facility] Reason for Treatment: psychosis  Prior Outpatient Therapy Prior Outpatient Therapy: Yes Prior Therapy Dates: active Prior Therapy Facilty/Provider(s): [facility] Reason for Treatment: medication management Does patient have an ACCT team?: No Does patient have Intensive In-House Services?  : No Does patient have [facility] services? : No Does patient have P4CC services?: No  ADL Screening (condition at time of admission) Patient's cognitive ability adequate to safely complete daily activities?: Yes Is the patient deaf or have difficulty hearing?: No Does the patient have difficulty seeing, even when wearing glasses/contacts?: No Does the patient have difficulty concentrating, remembering, or making decisions?: No Patient able to express need for assistance with ADLs?: Yes Does the patient have difficulty dressing or bathing?: No Independently performs ADLs?: Yes (appropriate for developmental age) Does the patient have difficulty walking or climbing stairs?: No Weakness of Legs: None Weakness of Arms/Hands: None  Home Assistive Devices/Equipment Home Assistive Devices/Equipment: None  Therapy Consults (therapy consults require a physician  order) PT Evaluation Needed: No OT Evalulation Needed: No SLP Evaluation Needed: No Abuse/Neglect Assessment (Assessment to be complete while patient is alone) Abuse/Neglect Assessment Can Be Completed: Yes Physical Abuse: Denies Verbal Abuse: Denies Sexual Abuse: Denies Exploitation of patient/patient's resources: Denies Self-Neglect: Denies Values / Beliefs Cultural Requests During Hospitalization: None Spiritual Requests During Hospitalization: None Consults Spiritual Care Consult Needed: No Social Work Consult Needed: No Merchant navy officer (For Healthcare) Does Patient Have a Medical Advance Directive?: No Would patient like information on creating a medical advance directive?: No - Patient declined Nutrition Screen- [facility] Adult/[facility] Has the patient recently lost weight without trying?: No Has the patient been eating poorly because of a decreased appetite?: No Malnutrition Screening Tool Score: 0        Disposition: Per Hillery Jacks, NP, patient meets inpatient admission criteria Disposition Initial Assessment Completed for this Encounter: Yes  This service was provided via telemedicine using a 2-way, interactive audio and video technology.  Names of all persons participating in this telemedicine service and their role in this encounter. Name: Gregory Saunders Role: patient  Name: Hillery Jacks Role: TTS  Name:  Role:   Name:  Role:     Daphene Calamity 10/22/2018 6:16 PM

## 2018-10-23 DIAGNOSIS — F203 Undifferentiated schizophrenia: Secondary | ICD-10-CM | POA: Diagnosis not present

## 2018-10-23 MED ORDER — NICOTINE 21 MG/24HR TD PT24
21.0000 mg | MEDICATED_PATCH | Freq: Once | TRANSDERMAL | Status: AC
  Administered 2018-10-23: 21 mg via TRANSDERMAL
  Filled 2018-10-23: qty 1

## 2018-10-23 MED ORDER — POTASSIUM CHLORIDE CRYS ER 20 MEQ PO TBCR
40.0000 meq | EXTENDED_RELEASE_TABLET | Freq: Once | ORAL | Status: AC
  Administered 2018-10-23: 40 meq via ORAL
  Filled 2018-10-23: qty 2

## 2018-10-23 NOTE — BHH Counselor (Signed)
Reassessment- Pt denies current AVH. Pt admits to hearing demons yesterday.  Pt denies SI/HI. Pt denies threatening others.  Pt states he is compliant with medication.  Pt denies using marijuana.  Pt states he does not remember what occurred yesterday.  TTS will continue to seek placement.   Wolfgang Phoenix, [facility] Triage Specialist

## 2018-10-23 NOTE — ED Provider Notes (Signed)
Resting in bed comfortably.  Denies complaint.   Doug Sou, MD 10/23/18 [phone]

## 2018-10-23 NOTE — Progress Notes (Signed)
Patient meets criteria for inpatient treatment. No appropriate or available beds at [facility]. CSW faxed referrals to the following facilities for review:  [facility]  [facility]  [facility]  [facility]  [facility]  [facility]  [facility]  [facility]  [facility]  [facility]  [facility]  [facility]  [facility]  [facility]  [facility]  [facility]  [facility]  [facility]  [facility]  [facility]  [facility]  [facility]  [facility]  [facility]   TTS will continue to seek bed placement.  Vilma Meckel. Algis Greenhouse, MSW, LCSW Clinical Social Work/Disposition Phone: [phone] Fax: [phone]

## 2018-10-23 NOTE — ED Notes (Signed)
Pt pacing in and out of room.  States he wants to leave.

## 2018-10-23 NOTE — ED Notes (Signed)
Pt becoming more agitated he is making threats that he is going to leave at seven. He states that he will be leaving even if he has to kill to leave

## 2018-10-24 DIAGNOSIS — F203 Undifferentiated schizophrenia: Secondary | ICD-10-CM | POA: Diagnosis not present

## 2018-10-24 MED ORDER — STERILE WATER FOR INJECTION IJ SOLN
INTRAMUSCULAR | Status: AC
  Filled 2018-10-24: qty 10

## 2018-10-24 MED ORDER — LORAZEPAM 2 MG/ML IJ SOLN
1.0000 mg | Freq: Once | INTRAMUSCULAR | Status: AC
  Administered 2018-10-24: 1 mg via INTRAMUSCULAR
  Filled 2018-10-24: qty 1

## 2018-10-24 MED ORDER — ZIPRASIDONE MESYLATE 20 MG IM SOLR
20.0000 mg | Freq: Once | INTRAMUSCULAR | Status: AC
  Administered 2018-10-24: 20 mg via INTRAMUSCULAR
  Filled 2018-10-24: qty 20

## 2018-10-24 MED ORDER — DIPHENHYDRAMINE HCL 50 MG/ML IJ SOLN
50.0000 mg | Freq: Once | INTRAMUSCULAR | Status: AC
  Administered 2018-10-24: 50 mg via INTRAMUSCULAR

## 2018-10-24 NOTE — ED Notes (Signed)
Pt breakfast tray placed on bedside table. Pt resting at this time. 

## 2018-10-24 NOTE — ED Notes (Signed)
When deputy attempted to handcuff patient to bed, pt became very violent. Two officers and 1 Engineer, materials were unable to contain patient. Reinforcements called by officers. EDP notified.

## 2018-10-24 NOTE — ED Provider Notes (Signed)
Blood pressure (!) 148/96, pulse (!) 105, temperature 99 F (37.2 C), temperature source Oral, resp. rate 16, height 6\' 1"  (1.854 m), weight 124.7 kg, SpO2 100 %.  In short, Gregory Saunders is a 23 y.o. male with a chief complaint of V70.1; Psychiatric Evaluation; and Hallucinations .  Refer to the original H&P for additional details.   12:20 PM She became acutely agitated and was threatening staff.  He had to be restrained by police.  Will administer Geodon, Ativan, Benadryl and maintain close monitoring.   CRITICAL CARE Performed by: Maia Plan Total critical care time: 35 minutes Critical care time was exclusive of separately billable procedures and treating other patients. Critical care was necessary to treat or prevent imminent or life-threatening deterioration. Critical care was time spent personally by me on the following activities: development of treatment plan with patient and/or surrogate as well as nursing, discussions with consultants, evaluation of patient's response to treatment, examination of patient, obtaining history from patient or surrogate, ordering and performing treatments and interventions, ordering and review of laboratory studies, ordering and review of radiographic studies, pulse oximetry and re-evaluation of patient's condition.  Alona Bene, MD Emergency Medicine    Long, Arlyss Repress, MD 10/24/18 1520

## 2018-10-24 NOTE — ED Notes (Signed)
[employer] at bedside to sit with patient.

## 2018-10-24 NOTE — ED Notes (Signed)
Pt given lunch meal tray. Pt calm and cooperative at this time.

## 2018-10-24 NOTE — ED Notes (Signed)
Pt given dinner meal tray

## 2018-10-24 NOTE — ED Notes (Addendum)
EKG completed per [facility] request. Given to EDP.

## 2018-10-24 NOTE — ED Notes (Signed)
Pt requesting to speak with MD. Dr. Jacqulyn Bath notified and will be in to speak with patient shortly.

## 2018-10-24 NOTE — ED Notes (Signed)
Pt becoming increasingly agitated. Pt will stop anyone walking by asking when a MD will come see him. Explained to patient multiple time that EDP will be around when he is available. Pt states, "I want to be serviced." States that he is not IVC and will leave if and when he wants to. Explained to pt that he cannot leave because he is under IVC. Pt then threatened this RN stating, "if anyone tries to sedate me I will kill them." [employer] deputy and security at bedside. Pt stating to deputy that if anyone touches him he will hurt them.

## 2018-10-25 NOTE — BH Assessment (Signed)
10/25/2018 Patient continues to meet in patient criteria. Pt information sent to the following facilities for review:   [place], [facility], [facility], [facility], [facility], [facility], [facility], [facility], [facility], [facility], [facility], [facility], [facility], [facility], [facility], [facility], [facility], [facility], [facility], [facility], [facility], [facility], [address] [facility].

## 2018-10-25 NOTE — ED Notes (Signed)
Tele-psych at bedside.

## 2018-10-25 NOTE — BHH Counselor (Signed)
TTS Re-assessment: Patient was alert and oriented x 4. Patient continues to experience delusional thought content stating "I'm a werewolf, I'm a vampire, you cannot kill me. I am Jesus Christ in the flesh." Patient reports seeing his previous basketball coaches dog shaking his head in disappointment. Patient repeatedly states he does not want to go to the hospital and wants to go home. Patient is tearful during periods of assessment. Patient continues to meet in patient treatment.

## 2018-10-26 ENCOUNTER — Encounter (HOSPITAL_COMMUNITY): Payer: Self-pay | Admitting: Registered Nurse

## 2018-10-26 DIAGNOSIS — F203 Undifferentiated schizophrenia: Secondary | ICD-10-CM | POA: Diagnosis not present

## 2018-10-26 MED ORDER — OLANZAPINE 5 MG PO TABS
5.0000 mg | ORAL_TABLET | Freq: Every day | ORAL | Status: DC
  Administered 2018-10-26: 5 mg via ORAL
  Filled 2018-10-26: qty 1

## 2018-10-26 NOTE — Consult Note (Signed)
Medication Recommendation:     Gregory Saunders, 23 y.o., male patient presented to [facility] under IVC petition by his mother with complaints of threatening behavior, altered mental status and psychosis.  Patient endorsed to hearing spiritual voices and prayers.  Spoke with Neysa Bonito, RN who informs that patient continues to be psychotic but has not antipsychotic medication.  Patient missed getting his Invega injection last week but unsure of the dosage.          Recommendations: Check to see what dosage of Gean Birchwood patient getting.  Can Start Zyprexa 5 mg Q hs   Disposition: Recommend psychiatric Inpatient admission when medically cleared.   Spoke with Neysa Bonito, RN will inform EDP (provider) of above recommendation and disposition   Shuvon Rankin, NP

## 2018-10-26 NOTE — Clinical Social Work Note (Signed)
Spoke with Gregory Saunders in TTS, who reported patient will be assessed again today to see if he continues to meet criteria for in-patient referral.  I reviewed the chart and interviewed patient.  Chart indicates that patient was [facility]'d to [facility] in 11/19 for much the same presentation.  At that time, he was given [place] sustenna injection and sent home.  Notes from this contact indicate that mother is working on getting guardianship of patient, and that he is still open for services at [facility], where he continues to be scheduled to get monthly Invega injections.  He missed his last scheduled injection last week. Pt was asleep when I went to see him, but was easily awakened and willingly engaged.  Gregory Saunders is shackled to the bed via right leg.  He states that he has been her for 4 days, and also that he is diagnosed with schizophrenia, "which means I have a strong belief in God."  Furthermore, when asked why he is here, he states that he is a witch, a vampire and a werewolf, and he thinks when he was talking to his mother about this, it made her nervous.  He also states his mother is "the supreme witch," and his father is a vampire.  Gregory Saunders admits to smoking cannabis, "but it helps me with anxiety." He states he is OK with getting his Invega injections because "they don't do anything anyway," and states that he only missed his latest scheduled injection "because I am here."  He states that he stays with his aunt, and has the support of a god mother. Gregory Saunders is calm and his mood is neutral.   He has active delusions, and hears voices in the context of his delusions.  He lacks insight and is likely not medication compliant.

## 2018-10-27 MED ORDER — NICOTINE 14 MG/24HR TD PT24
14.0000 mg | MEDICATED_PATCH | Freq: Once | TRANSDERMAL | Status: DC
  Filled 2018-10-27: qty 1

## 2018-10-27 MED ORDER — PALIPERIDONE ER 3 MG PO TB24
3.0000 mg | ORAL_TABLET | Freq: Every day | ORAL | Status: DC
  Filled 2018-10-27 (×2): qty 1

## 2018-10-27 MED ORDER — PALIPERIDONE PALMITATE ER 234 MG/1.5ML IM SUSY: 234.0000 mg | PREFILLED_SYRINGE | Freq: Once | INTRAMUSCULAR | Status: DC

## 2018-10-27 NOTE — ED Notes (Signed)
Pt given lunch tray.

## 2018-10-27 NOTE — ED Notes (Signed)
Pt breakfast tray placed on bedside table. Pt resting at this time. 

## 2018-10-27 NOTE — ED Provider Notes (Signed)
  Physical Exam  BP 137/84 (BP Location: Left Arm)   Pulse 91   Temp 98.6 F (37 C) (Oral)   Resp 16   Ht 6\' 1"  (1.854 m)   Wt 124.7 kg   SpO2 100%   BMI 36.28 kg/m   Physical Exam  ED Course/Procedures   Clinical Course as of Oct 27 1325  Sat Oct 22, 2018  1700 Have evaluated the patient and I do not find him aggressive or particularly incapacitated by his auditory hallucinations.  I am holding off on signing the first examination until behavioral health does their evaluation.   [MB]  1819 Patient has been evaluated by behavioral health.  They feel he should be kept here until placed.  The patient is not very happy to be here in this and is becoming more agitated.  I finished the first examination and if ordered some IM Geodon, as he is refusing any oral medication.   [MB]    Clinical Course User Index [MB] Terrilee Files, MD    Procedures  MDM  Discussed with Shuvon Rankin.  Patient needs Gean Birchwood 234 mg IM since he has missed the last dose and Invega 3 mg daily.  Still pending placement.      Benjiman Core, MD 10/27/18 1327

## 2018-10-27 NOTE — BH Assessment (Signed)
BHH Assessment Progress Note    Patient was seen for re-assessment and continues to state that he was given the special gift by God that he receive requests from spirits for prayer.  He said this occurred because he was born on a Sabbath day.  He states that because he was born in June and he is a Gemini that he was born a IT consultant and states that he can even howl like one.  Patient states: "My family cannot accept that I am a vampire/werewolf.  I never threatened to hurt anyone."  Patient states, "I am a schizophrenic, I don't need to be confined to this room."  Patient denies any current SI/HI.  Continued Inpatient is recommended.

## 2018-10-27 NOTE — ED Notes (Signed)
Pt left with sheriffs dept

## 2018-10-27 NOTE — ED Notes (Signed)
TTS in progress 

## 2018-10-27 NOTE — Progress Notes (Signed)
Pt accepted to [facility] [address] MD is the accepting/attending provider.  Call report to [phone]  [email or web address] notified.   Pt is IVC PLEASE FAX IVC TO [phone] Pt may be transported by Law Enforcement Pt scheduled  to arrive at [facility] as soon as transport can be arranged.  Timmothy Euler. Kaylyn Lim, MSW, LCSWA Disposition Clinical Social Work [phone] (cell) [phone] (office)

## 2018-10-27 NOTE — Progress Notes (Signed)
CSW received call from [facility] indicating that they had called report and were told that the [address] was unaware that the patient was coming.  CSW called [facility] and spoke with Tobi Bastos in Intake and Assessment.  Tobi Bastos changed patient's treatment to [address] from [address].  Carney Bern T. Kaylyn Lim, MSW, LCSWA Disposition Clinical Social Work [phone] (cell) [phone] (office)

## 2018-10-27 NOTE — ED Notes (Signed)
This NT offered this pt to wash off considering he has been for a few days. Pt stated he has been washing up himself & is ok for now.

## 2018-10-27 NOTE — Progress Notes (Signed)
Patient continues to meet inpatient criteria per Assunta Found, NP  Pt. Referral information resent to the following :  [facility]  [facility]  [facility]  [facility]  [facility]  [facility]  [facility]  [facility]  [facility]  [facility]  [facility]  [facility]  [facility]  [facility]     Disposition CSWs will continue to follow for placement.  Timmothy Euler. Kaylyn Lim, MSW, LCSWA Disposition Clinical Social Work [phone] (cell) [phone] (office)

## 2018-10-27 NOTE — ED Notes (Signed)
Report given to [facility] west, [facility] 910 [phone]

## 2018-10-27 NOTE — Consult Note (Signed)
  Medication recommendation:    Patient mother informed that patient was getting Invega Sustenna 234 mg monthly.  Patient missed last dose.   Recommendation:   Give Invega Sustenna 234 mg IM; Start Invega 3 mg daily.  Stop Zyprexa 5 mg Q hs   Disposition:  Continue to seek inpatient psychiatric treatment  Spoke with Dr. Rubin Payor; informed of above medication recommendation and disposition   Shuvon B. Rankin, NP

## 2018-10-27 NOTE — ED Notes (Signed)
TTS consult in process. 

## 2019-04-07 DIAGNOSIS — S86011A Strain of right Achilles tendon, initial encounter: Secondary | ICD-10-CM

## 2019-04-07 HISTORY — DX: Strain of right Achilles tendon, initial encounter: S86.011A

## 2019-04-11 ENCOUNTER — Other Ambulatory Visit: Payer: Self-pay | Admitting: Surgery

## 2019-04-11 DIAGNOSIS — S86011A Strain of right Achilles tendon, initial encounter: Secondary | ICD-10-CM

## 2019-04-11 DIAGNOSIS — M25571 Pain in right ankle and joints of right foot: Secondary | ICD-10-CM

## 2019-04-21 ENCOUNTER — Other Ambulatory Visit: Payer: Self-pay

## 2019-04-21 ENCOUNTER — Ambulatory Visit
Admission: RE | Admit: 2019-04-21 | Discharge: 2019-04-21 | Disposition: A | Discharge status: Home / Self Care | Payer: Medicaid Other | Source: Ambulatory Visit | Attending: Surgery | Admitting: Surgery

## 2019-04-21 DIAGNOSIS — M25571 Pain in right ankle and joints of right foot: Secondary | ICD-10-CM | POA: Insufficient documentation

## 2019-04-21 DIAGNOSIS — S86011A Strain of right Achilles tendon, initial encounter: Secondary | ICD-10-CM | POA: Diagnosis present

## 2020-08-19 IMAGING — MR MRI OF THE RIGHT ANKLE WITHOUT CONTRAST
5 series · 40 of 40 positions shown · non-contrast
Comparison: None.

CLINICAL DATA: Right ankle pain. Swelling at the Achilles tendon.
Injury playing basketball 3 months ago.

EXAM:
MRI OF THE RIGHT ANKLE WITHOUT CONTRAST
TECHNIQUE: Multiplanar, multisequence MR imaging of the ankle was performed. No
intravenous contrast was administered.

[Series 2: PD fat-sat · axial · right · 3.0mm · 0.50mm/px · z∈[-59,+81]mm · 10 of 36 slices shown]
[im 1/36]
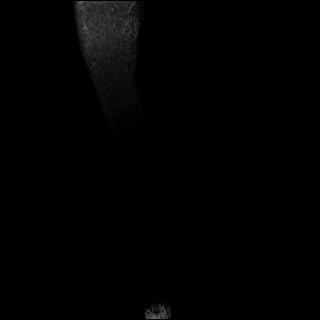
[im 4/36]
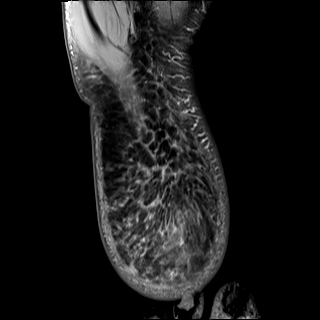
[im 8/36]
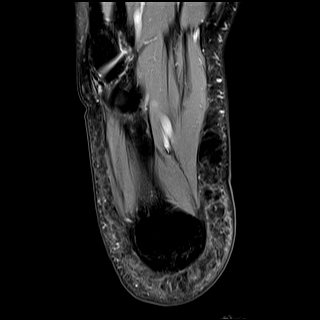
[im 12/36]
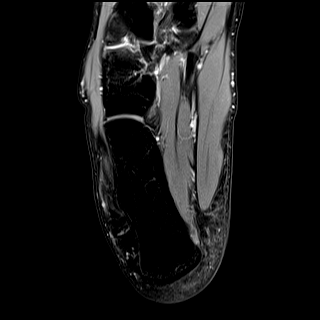
[im 16/36]
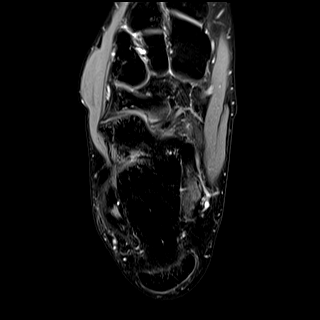
[im 20/36]
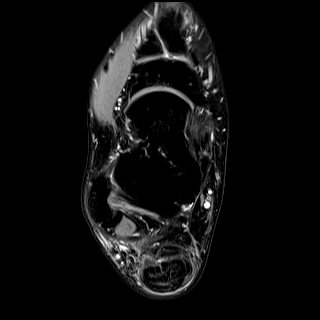
[im 24/36]
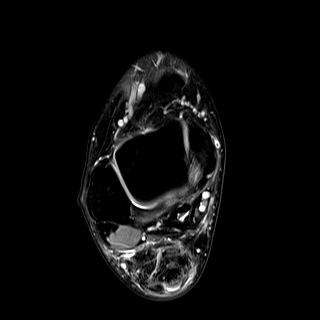
[im 28/36]
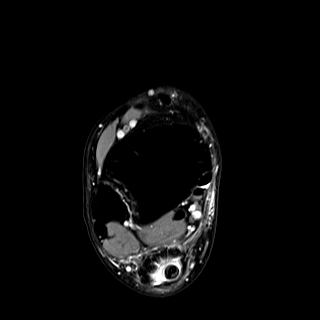
[im 32/36]
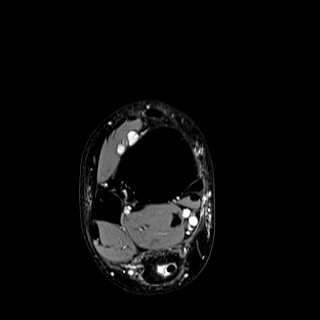
[im 36/36]
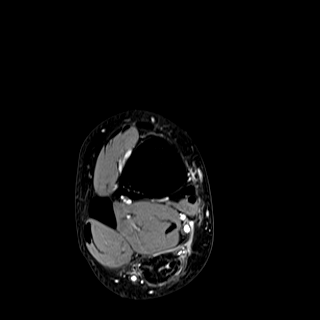

[Series 3: T2 fat-sat · axial · right · 3.0mm · 0.50mm/px · z∈[-59,+81]mm · 10 of 36 slices shown (1 of 2)]
[im 1/36]
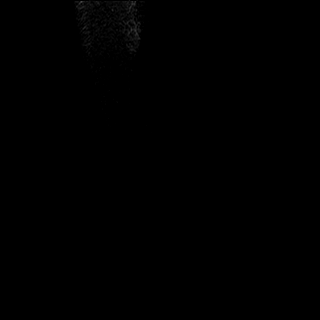
[im 4/36]
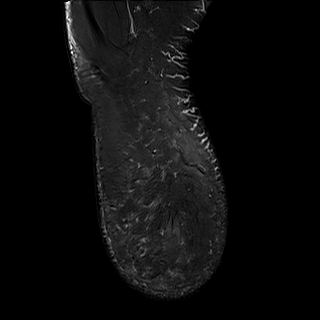
[im 8/36]
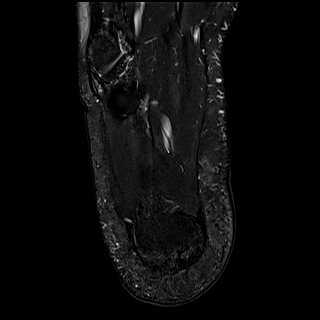
[im 12/36]
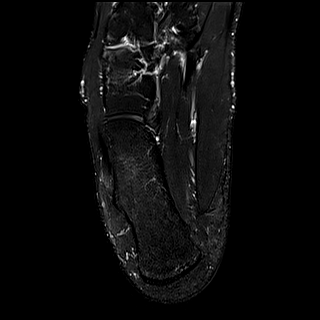
[im 16/36]
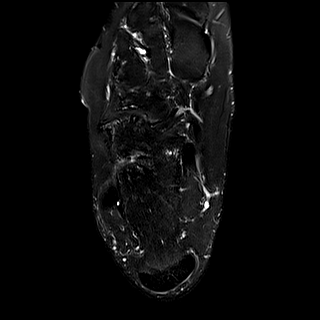
[im 20/36]
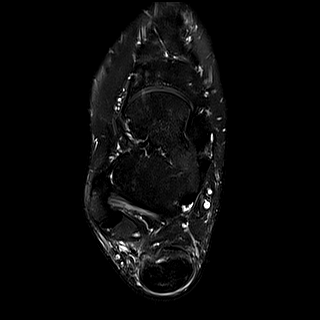
[im 24/36]
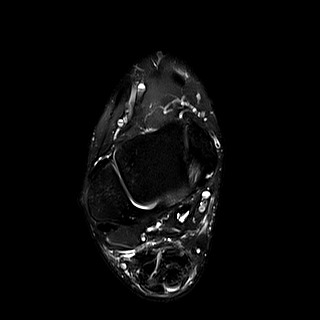
[im 28/36]
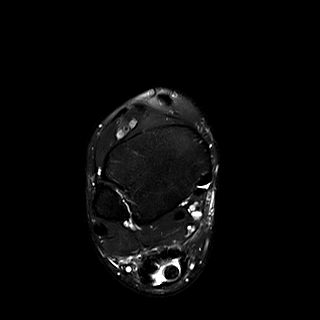
[im 32/36]
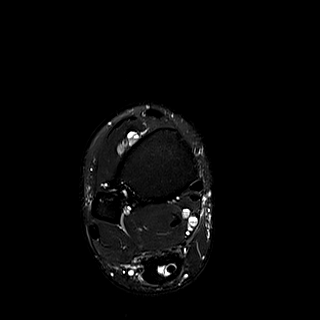
[im 36/36]
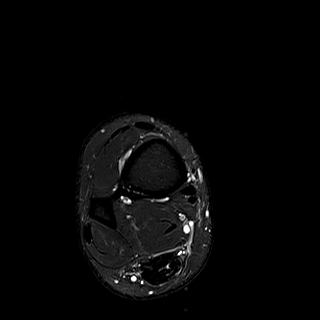

[Series 4: T2 fat-sat · coronal · right · 3.0mm · 0.62mm/px · 10 of 40 slices shown (2 of 2)]
[im 1/40]
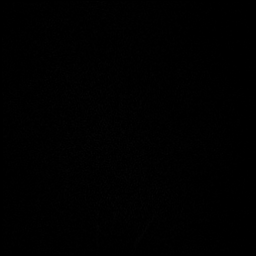
[im 5/40]
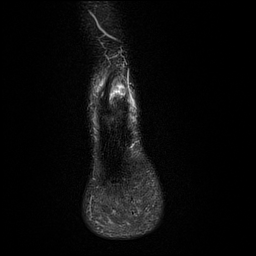
[im 9/40]
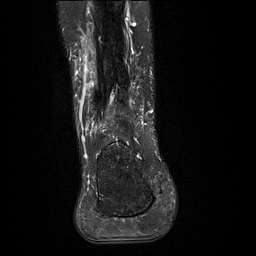
[im 14/40]
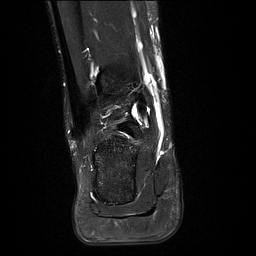
[im 18/40]
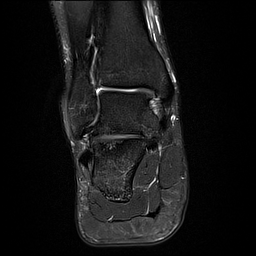
[im 22/40]
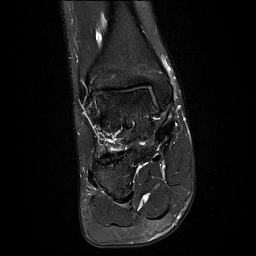
[im 27/40]
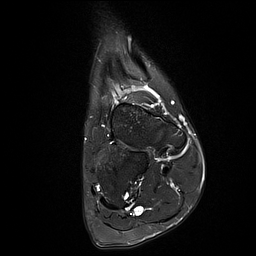
[im 31/40]
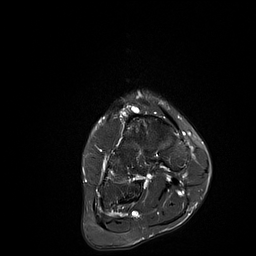
[im 35/40]
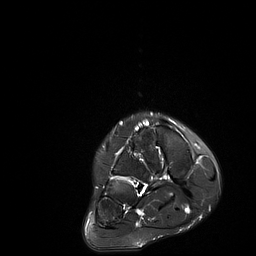
[im 40/40]
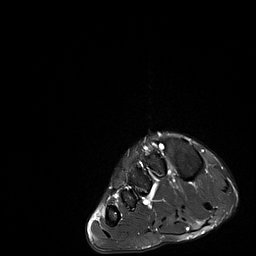

[Series 5: T1 · sagittal · right · 4.0mm · 0.70mm/px · 5 of 21 slices shown]
[im 1/21]
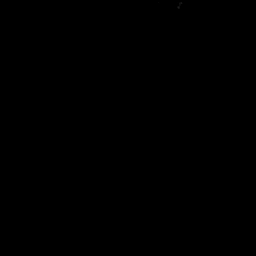
[im 6/21]
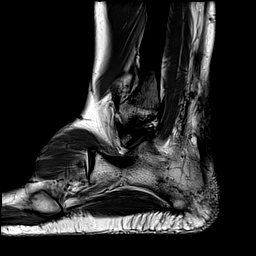
[im 11/21]
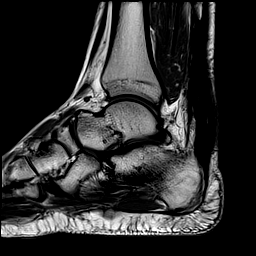
[im 16/21]
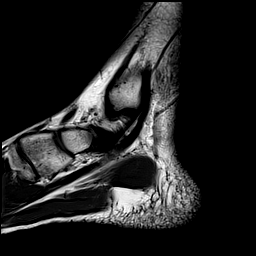
[im 21/21]
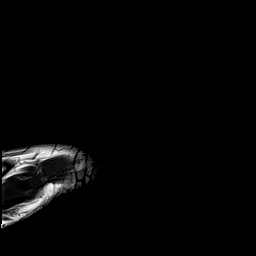

[Series 6: STIR · sagittal · right · 4.0mm · 0.35mm/px · 5 of 21 slices shown]
[im 1/21]
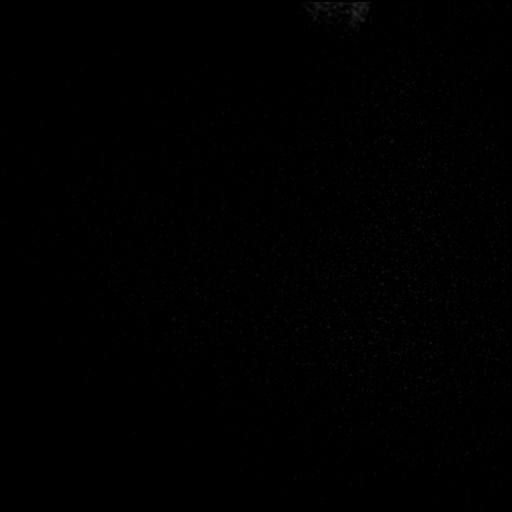
[im 6/21]
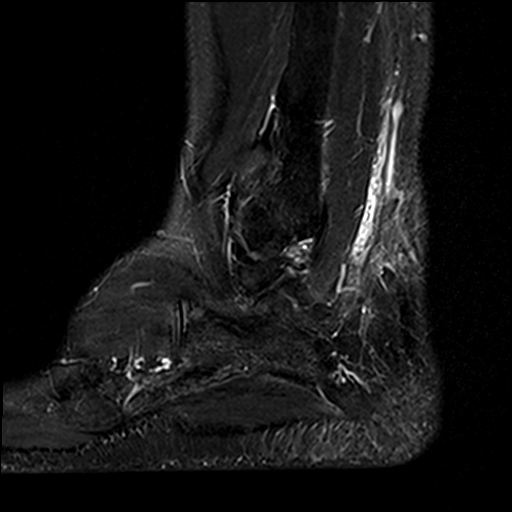
[im 11/21]
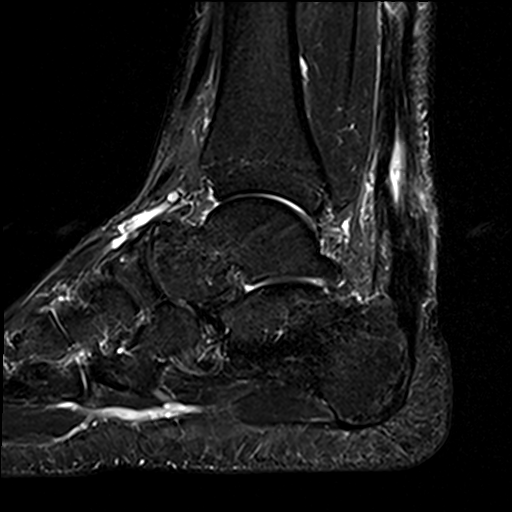
[im 16/21]
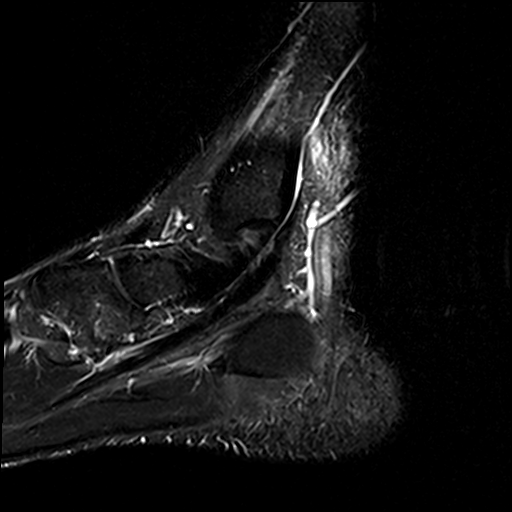
[im 21/21]
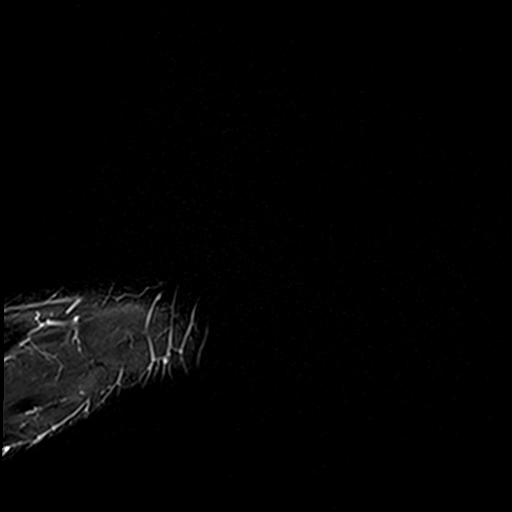

[40 of 40 positions shown; findings below may reference images not displayed]

FINDINGS: TENDONS

Peroneal: Normal.

Posteromedial: Tiny amount of fluid in the tendon sheath of the
posterior tibialis tendon at the level of the medial malleolus.
Small amount of fluid in the flexor hallucis longus tendon posterior
to the ankle. The tendons are intact.

Anterior: Normal.

Achilles: Severe chronic Achilles tendinopathy with an extensive
incomplete intrasubstance tear of the tendon centered 5 cm above the
calcaneal insertion.

Plantar Fascia: Normal.

LIGAMENTS

Lateral: Normal.

Medial: Normal.

CARTILAGE

Ankle Joint: No joint effusion or chondral defect.

Subtalar Joints/Sinus Tarsi: No joint effusion or chondral defect.

Bones: Normal.

Other: None
IMPRESSION: Extensive intrasubstance tear of the severely degenerated and
hypertrophied distal Achilles tendon.

## 2020-10-16 ENCOUNTER — Encounter: Payer: Self-pay | Admitting: Emergency Medicine

## 2020-10-16 ENCOUNTER — Other Ambulatory Visit: Payer: Self-pay

## 2020-10-16 ENCOUNTER — Emergency Department
Admission: EM | Admit: 2020-10-16 | Discharge: 2020-10-17 | Disposition: A | Discharge status: Home / Self Care | Payer: Medicaid Other | Attending: Emergency Medicine | Admitting: Emergency Medicine

## 2020-10-16 DIAGNOSIS — F1290 Cannabis use, unspecified, uncomplicated: Secondary | ICD-10-CM

## 2020-10-16 DIAGNOSIS — F989 Unspecified behavioral and emotional disorders with onset usually occurring in childhood and adolescence: Secondary | ICD-10-CM | POA: Diagnosis not present

## 2020-10-16 DIAGNOSIS — Z20822 Contact with and (suspected) exposure to covid-19: Secondary | ICD-10-CM | POA: Diagnosis not present

## 2020-10-16 DIAGNOSIS — E876 Hypokalemia: Secondary | ICD-10-CM | POA: Insufficient documentation

## 2020-10-16 DIAGNOSIS — F17210 Nicotine dependence, cigarettes, uncomplicated: Secondary | ICD-10-CM | POA: Diagnosis not present

## 2020-10-16 DIAGNOSIS — F250 Schizoaffective disorder, bipolar type: Secondary | ICD-10-CM | POA: Diagnosis not present

## 2020-10-16 DIAGNOSIS — R4689 Other symptoms and signs involving appearance and behavior: Secondary | ICD-10-CM

## 2020-10-16 DIAGNOSIS — R259 Unspecified abnormal involuntary movements: Secondary | ICD-10-CM | POA: Diagnosis present

## 2020-10-16 DIAGNOSIS — F1210 Cannabis abuse, uncomplicated: Secondary | ICD-10-CM | POA: Insufficient documentation

## 2020-10-16 HISTORY — DX: Cannabis use, unspecified, uncomplicated: F12.90

## 2020-10-16 LAB — COMPREHENSIVE METABOLIC PANEL
ALT: 47 U/L — ABNORMAL HIGH (ref 0–44)
AST: 25 U/L (ref 15–41)
Albumin: 4.2 g/dL (ref 3.5–5.0)
Alkaline Phosphatase: 49 U/L (ref 38–126)
Anion gap: 10 (ref 5–15)
BUN: 8 mg/dL (ref 6–20)
CO2: 28 mmol/L (ref 22–32)
Calcium: 9.6 mg/dL (ref 8.9–10.3)
Chloride: 101 mmol/L (ref 98–111)
Creatinine, Ser: 1.35 mg/dL — ABNORMAL HIGH (ref 0.61–1.24)
GFR, Estimated: >60 mL/min (ref >60)
Glucose, Bld: 83 mg/dL (ref 70–99)
Potassium: 3.3 mmol/L — ABNORMAL LOW (ref 3.5–5.1)
Sodium: 139 mmol/L (ref 135–145)
Total Bilirubin: 1.8 mg/dL — ABNORMAL HIGH (ref 0.3–1.2)
Total Protein: 7.9 g/dL (ref 6.5–8.1)

## 2020-10-16 LAB — CBC
HCT: 40.4 % (ref 39.0–52.0)
Hemoglobin: 13.3 g/dL (ref 13.0–17.0)
MCH: 27.1 pg (ref 26.0–34.0)
MCHC: 32.9 g/dL (ref 30.0–36.0)
MCV: 82.4 fL (ref 80.0–100.0)
Platelets: 250 10*3/uL (ref 150–400)
RBC: 4.9 MIL/uL (ref 4.22–5.81)
RDW: 13.6 % (ref 11.5–15.5)
WBC: 5 10*3/uL (ref 4.0–10.5)
nRBC: 0 % (ref 0.0–0.2)

## 2020-10-16 LAB — RESP PANEL BY RT-PCR (FLU A&B, COVID) ARPGX2
Influenza A by PCR: NEGATIVE
Influenza B by PCR: NEGATIVE
SARS Coronavirus 2 by RT PCR: NEGATIVE

## 2020-10-16 LAB — ACETAMINOPHEN LEVEL: Acetaminophen (Tylenol), Serum: <10 ug/mL — ABNORMAL LOW (ref 10–30)

## 2020-10-16 LAB — ETHANOL: Alcohol, Ethyl (B): <10 mg/dL (ref <10)

## 2020-10-16 LAB — SALICYLATE LEVEL: Salicylate Lvl: <7 mg/dL — ABNORMAL LOW (ref 7.0–30.0)

## 2020-10-16 MED ORDER — OLANZAPINE 5 MG PO TBDP: 15.0000 mg | ORAL_TABLET | Freq: Every day | ORAL | Status: DC

## 2020-10-16 MED ORDER — HALOPERIDOL LACTATE 5 MG/ML IJ SOLN
INTRAMUSCULAR | Status: AC
  Filled 2020-10-16: qty 1

## 2020-10-16 MED ORDER — LORAZEPAM 2 MG/ML IJ SOLN
INTRAMUSCULAR | Status: AC
  Filled 2020-10-16: qty 1

## 2020-10-16 MED ORDER — POTASSIUM CHLORIDE CRYS ER 20 MEQ PO TBCR: 40.0000 meq | EXTENDED_RELEASE_TABLET | Freq: Once | ORAL | Status: DC

## 2020-10-16 MED ORDER — DIPHENHYDRAMINE HCL 50 MG/ML IJ SOLN
50.0000 mg | Freq: Once | INTRAMUSCULAR | Status: AC
  Administered 2020-10-16: 50 mg via INTRAMUSCULAR

## 2020-10-16 MED ORDER — LORAZEPAM 2 MG/ML IJ SOLN
2.0000 mg | Freq: Once | INTRAMUSCULAR | Status: AC
  Administered 2020-10-16: 2 mg via INTRAMUSCULAR

## 2020-10-16 MED ORDER — DIPHENHYDRAMINE HCL 50 MG/ML IJ SOLN
INTRAMUSCULAR | Status: AC
  Filled 2020-10-16: qty 1

## 2020-10-16 MED ORDER — HALOPERIDOL LACTATE 5 MG/ML IJ SOLN
5.0000 mg | Freq: Once | INTRAMUSCULAR | Status: AC
  Administered 2020-10-16: 5 mg via INTRAMUSCULAR

## 2020-10-16 NOTE — ED Triage Notes (Signed)
Pt comes into the ED via [facility] department under IVC.  Pt states his mother had him IVC's. Per the mother the patient is not taking his medication.  Pt denies any SI or HI but admits to hearing and seeing hallucinations.  Pt states it as "It is an ability I was born with and there is nothing wrong with it".  Pt able to answer all questions at this time and is sitting calmly with RN and officer.

## 2020-10-16 NOTE — ED Notes (Signed)
Pt declines offered meal tray. Pt still in own clothes, has phone and wallet on him.

## 2020-10-16 NOTE — Consult Note (Signed)
[facility] Face-to-Face Psychiatry Consult   Reason for Consult: Consult for this 25 year old man with a history of mental health problems sent in under IVC filed by family Referring Physician: Katrinka Blazing Patient Identification: Gregory Saunders MRN:  [id number] Principal Diagnosis: Schizoaffective disorder, bipolar type (HCC) Diagnosis:  Principal Problem:   Schizoaffective disorder, bipolar type (HCC) Active Problems:   Cannabis abuse   Total Time spent with patient: 1 hour  Subjective:   Gregory Saunders is a 25 y.o. male patient admitted with "they tricked me".  HPI: Patient seen and chart reviewed.  Family helpfully sent a lot of paperwork with him.  I also spoke with his mother on the telephone.  11 year old man brought in under IVC filed by his family.  Paperwork alleges that the patient is making psychotic delusional statements about being God and the devil.  It also alleges he has been threatening to his family and acting bizarrely.  He is noncompliant with recommended medication.  On interview the patient is irritable but cooperative with the conversation.  He tells me that this is all the fault of his mother who tricked him into being brought to the hospital.  He insists that he is free to get up and walk out anytime.  He admits that he can see and hear spirits and that he has the gift of prophecy but says that he thinks there is nothing wrong with that.  Currently living in his car because he cannot get along with his family.  Family reports that when he is on his medication he is doing much much better and can live with his family and be lucid and get along well but that off medicine he decompensates and becomes hostile angry and threatening.  Mother reports he has made phone calls recently in which she has threatened to kill her for various perceived slights.  She blames a lot of this on using marijuana.  Patient admits that he uses marijuana but he is a little evasive about how much of it.  He  denies any other drug use.  He is not currently getting any psychiatric treatment and it sounds like he has not been compliant with medicines for many months at least.  Past Psychiatric History: Mother reports that he had his first psychotic episode a couple years ago.  He may have been in college at the time.  He has had a few hospitalizations since then for psychosis.  Various medications have been tried including olanzapine and [place].  Mother reports that when on antipsychotics he definitely gets better although sometimes the doses seem over sedating for him.  No history of suicide attempts.  Risk to Self:   Risk to Others:   Prior Inpatient Therapy:   Prior Outpatient Therapy:    Past Medical History:  Past Medical History:  Diagnosis Date  . Schizophrenia [facility])     Past Surgical History:  Procedure Laterality Date  . stye removal    . WISDOM TOOTH EXTRACTION     Family History: History reviewed. No pertinent family history. Family Psychiatric  History: Reportedly there is a history of bipolar disorder in the family Social History:  Social History   Substance and Sexual Activity  Alcohol Use Never     Social History   Substance and Sexual Activity  Drug Use Not Currently  . Types: Marijuana    Social History   Socioeconomic History  . Marital status: Single    Spouse name: Not on file  .  Number of children: Not on file  . Years of education: Not on file  . Highest education level: Not on file  Occupational History  . Not on file  Tobacco Use  . Smoking status: Current Every Day Smoker    Packs/day: 0.50  . Smokeless tobacco: Never Used  Vaping Use  . Vaping Use: Former  Substance and Sexual Activity  . Alcohol use: Never  . Drug use: Not Currently    Types: Marijuana  . Sexual activity: Not on file  Other Topics Concern  . Not on file  Social History Narrative  . Not on file   Social Determinants of Health   Financial Resource Strain: Not on  file  Food Insecurity: Not on file  Transportation Needs: Not on file  Physical Activity: Not on file  Stress: Not on file  Social Connections: Not on file   Additional Social History:    Allergies:   Allergies  Allergen Reactions  . Amoxicillin     Childhood, possible rash   . Penicillins Other (See Comments)    Unknown     Labs:  Results for orders placed or performed during the hospital encounter of 10/16/20 (from the past 48 hour(s))  Comprehensive metabolic panel     Status: Abnormal   Collection Time: 10/16/20  2:52 PM  Result Value Ref Range   Sodium 139 135 - 145 mmol/L   Potassium 3.3 (L) 3.5 - 5.1 mmol/L   Chloride 101 98 - 111 mmol/L   CO2 28 22 - 32 mmol/L   Glucose, Bld 83 70 - 99 mg/dL    Comment: Glucose reference range applies only to samples taken after fasting for at least 8 hours.   BUN 8 6 - 20 mg/dL   Creatinine, Ser 3.71 (H) 0.61 - 1.24 mg/dL   Calcium 9.6 8.9 - 69.6 mg/dL   Total Protein 7.9 6.5 - 8.1 g/dL   Albumin 4.2 3.5 - 5.0 g/dL   AST 25 15 - 41 U/L   ALT 47 (H) 0 - 44 U/L   Alkaline Phosphatase 49 38 - 126 U/L   Total Bilirubin 1.8 (H) 0.3 - 1.2 mg/dL   GFR, Estimated >78 >93 mL/min    Comment: (NOTE) Calculated using the CKD-EPI Creatinine Equation (2021)    Anion gap 10 5 - 15    Comment: Performed at [facility], [address] [place], [place] [address]  Ethanol     Status: None   Collection Time: 10/16/20  2:52 PM  Result Value Ref Range   Alcohol, Ethyl (B) <10 <10 mg/dL    Comment: (NOTE) Lowest detectable limit for serum alcohol is 10 mg/dL.  For medical purposes only. Performed at [facility], [address] [place], [place] [address]   Salicylate level     Status: Abnormal   Collection Time: 10/16/20  2:52 PM  Result Value Ref Range   Salicylate Lvl <7.0 (L) 7.0 - 30.0 mg/dL    Comment: Performed at [facility], [address] [place], [place] [address]  Acetaminophen level      Status: Abnormal   Collection Time: 10/16/20  2:52 PM  Result Value Ref Range   Acetaminophen (Tylenol), Serum <10 (L) 10 - 30 ug/mL    Comment: (NOTE) Therapeutic concentrations vary significantly. A range of 10-30 ug/mL  may be an effective concentration for many patients. However, some  are best treated at concentrations outside of this range. Acetaminophen concentrations >150 ug/mL at 4  hours after ingestion  and >50 ug/mL at 12 hours after ingestion are often associated with  toxic reactions.  Performed at [facility], [address] [place], [place] [address]   cbc     Status: None   Collection Time: 10/16/20  2:52 PM  Result Value Ref Range   WBC 5.0 4.0 - 10.5 K/uL   RBC 4.90 4.22 - 5.81 MIL/uL   Hemoglobin 13.3 13.0 - 17.0 g/dL   HCT 80.9 98.3 - 38.2 %   MCV 82.4 80.0 - 100.0 fL   MCH 27.1 26.0 - 34.0 pg   MCHC 32.9 30.0 - 36.0 g/dL   RDW 50.5 39.7 - 67.3 %   Platelets 250 150 - 400 K/uL   nRBC 0.0 0.0 - 0.2 %    Comment: Performed at [facility], [address] [place], [place] [address]    Current Facility-Administered Medications  Medication Dose Route Frequency Provider Last Rate Last Admin  . OLANZapine zydis (ZYPREXA) disintegrating tablet 15 mg  15 mg Oral QHS Mervin Ramires T, MD      . potassium chloride SA (KLOR-CON) CR tablet 40 mEq  40 mEq Oral Once Gilles Chiquito, MD       Current Outpatient Medications  Medication Sig Dispense Refill  . Omega-3 Fatty Acids (OMEGA-3 FISH OIL PO) Take 1 capsule by mouth daily.    Marland Kitchen OVER THE COUNTER MEDICATION Take 1 tablet by mouth daily. En-Lite (iron supplement)    . Paliperidone Palmitate (INVEGA SUSTENNA IM) Inject into the muscle every 30 (thirty) days.      Musculoskeletal: Strength & Muscle Tone: within normal limits Gait & Station: normal Patient leans: N/A  Psychiatric Specialty Exam: Physical Exam Vitals and nursing note reviewed.  Constitutional:      Appearance: He is  well-developed and well-nourished.  HENT:     Head: Normocephalic and atraumatic.  Eyes:     Conjunctiva/sclera: Conjunctivae normal.     Pupils: Pupils are equal, round, and reactive to light.  Cardiovascular:     Heart sounds: Normal heart sounds.  Pulmonary:     Effort: Pulmonary effort is normal.  Abdominal:     Palpations: Abdomen is soft.  Musculoskeletal:        General: Normal range of motion.     Cervical back: Normal range of motion.  Skin:    General: Skin is warm and dry.  Neurological:     General: No focal deficit present.     Mental Status: He is alert.  Psychiatric:        Attention and Perception: He is inattentive.        Mood and Affect: Affect is angry and inappropriate.        Speech: Speech is rapid and pressured.        Behavior: Behavior is agitated. Behavior is not aggressive.        Thought Content: Thought content is paranoid and delusional. Thought content does not include homicidal or suicidal ideation.        Cognition and Memory: Memory is impaired.        Judgment: Judgment is inappropriate.     Review of Systems  Constitutional: Negative.   HENT: Negative.   Eyes: Negative.   Respiratory: Negative.   Cardiovascular: Negative.   Gastrointestinal: Negative.   Musculoskeletal: Negative.   Skin: Negative.   Neurological: Negative.   Psychiatric/Behavioral: Positive for confusion, dysphoric mood, hallucinations and sleep disturbance. Negative for suicidal ideas.  Blood pressure (!) 148/86, pulse 75, temperature 98.6 F (37 C), temperature source Oral, resp. rate 18, height 6\' 1"  (1.854 m), weight 99.8 kg, SpO2 99 %.Body mass index is 29.03 kg/m.  General Appearance: Casual  Eye Contact:  Good  Speech:  Pressured  Volume:  Increased  Mood:  Angry and Irritable  Affect:  Inappropriate and Labile  Thought Process:  Disorganized  Orientation:  Full (Time, Place, and Person)  Thought Content:  Illogical, Delusions, Hallucinations:  Auditory, Paranoid Ideation, Rumination and Tangential  Suicidal Thoughts:  No  Homicidal Thoughts:  Patient denies it but family reports that he has been threatening to them  Memory:  Immediate;   Fair Recent;   Fair Remote;   Poor  Judgement:  Impaired  Insight:  Shallow  Psychomotor Activity:  Restlessness  Concentration:  Concentration: Poor  Recall:  Poor  Fund of Knowledge:  Fair  Language:  Fair  Akathisia:  No  Handed:  Right  AIMS (if indicated):     Assets:  Desire for Improvement Physical Health Resilience Social Support  ADL's:  Impaired  Cognition:  Impaired,  Mild  Sleep:        Treatment Plan Summary: Medication management and Plan 25 year old man who presents as being hyperactive delusional paranoid with racing thoughts and confusion.  He did not make any specific threats in conversation with me but clearly is paranoid about his family and angry with him.  Family reports that he has been threatening verbally to them.  Patient clearly has a ongoing psychotic disorder which seems like it may fit a schizoaffective pattern.  Would certainly function better on medication although he has poor insight about it.  We will continue to uphold the IVC and recommend inpatient hospitalization.  We have no beds available currently and TTS can work on referring him out to other facilities.  I am ordering olanzapine to start at night in the hopes that he may agree to take some medication.  Full labs still pending.  Disposition: Recommend psychiatric Inpatient admission when medically cleared. Supportive therapy provided about ongoing stressors.  Mordecai RasmussenJohn Judia Arnott, MD 10/16/2020 5:19 PM

## 2020-10-16 NOTE — ED Notes (Signed)
Pt refusing to dress out in scrubs at this time.  Beh quad RN notified.  Pt to be dressed out once he is roomed.

## 2020-10-16 NOTE — ED Notes (Signed)
TTS tele-system removed from room

## 2020-10-16 NOTE — ED Notes (Signed)
TTS tele-system brought into room for consult

## 2020-10-16 NOTE — BH Assessment (Signed)
Comprehensive Clinical Assessment (CCA) Screening, Triage and Referral Note  10/16/2020 DONZEL ROMACK [id number]   Gregory Saunders is an 25 y.o male who presents to [facility] ED involuntarily for treatment. Per triage note, Pt comes into the ED via [facility] department under IVC.  Pt states his mother had him IVC's. Per the mother the patient is not taking his medication.  Pt denies any SI or HI but admits to hearing and seeing hallucinations.  Pt states it as "It is an ability I was born with and there is nothing wrong with it".  Pt able to answer all questions at this time and is sitting calmly with RN and officer.   During TTS assessment pt presents alert, hypomanic, tangential, and oriented x 4, irritable, argumentative but cooperative, and mood-congruent with affect. The pt does not appear to be responding to internal or external stimuli and presents with some delusional thinking around being a prophet and receiving messages about his mother being the devil. Pt verified the information provided to triage RN and angrily states "she does this to me every year around this time and I'm tired of being manipulated by all of them". Pt denies any complaints stating, "I was chilling minding my own business, they tricked me and lied to me now I'm here". Pt reports a MH hx of schizophrenia and denies compliance with medications for over a year. Pt reports feeling nothing is wrong with him and went on a tangent regarding his dislike for his family, mom being the devil, sleeping in his car, feeling manipulated and people trying to kill him but being unable to because he can't die. Pt denies feeling depressed or anxious. Pt anxiously expressed the need to leave and appeared suspicious of staff stating, "I do not trust anyone here, you are being manipulated by them and I do not want to be manipulated".  Pt denies any current SA, OPT or family hx of MH/SA. Pt reports an INPT hx with [facility], [facility], [facility] and [facility] but  expressed treatment to be unhelpful. Pt reports to currently live in his car due to lack of supports and trust in people. Pt denies any current SI/HI/AH/VH but states "I mean I can see and hear things other people can't because it's a gift I was born with and nothing is wrong with it". Pt is currently adamant nothing is wrong and continues to request discharge.   Per Dr. Toni Amend pt meets criteria for INPT  Chief Complaint:  Chief Complaint  Patient presents with  . Mental Health Problem   Visit Diagnosis: Per hx schizoaffective disorder, Bipolar type   Patient Reported Information How did you hear about [place]? Other (Comment)   Referral name: IVC   Referral phone number: No data recorded Whom do you see for routine medical problems? I don't have a doctor   Practice/Facility Name: No data recorded  Practice/Facility Phone Number: No data recorded  Name of Contact: No data recorded  Contact Number: No data recorded  Contact Fax Number: No data recorded  Prescriber Name: No data recorded  Prescriber Address (if known): No data recorded What Is the Reason for Your Visit/Call Today? Pt under IVC  How Long Has This Been Causing You Problems? > than 6 months  Have You Recently Been in Any Inpatient Treatment (Hospital/Detox/Crisis Center/28-Day Program)? Yes   Name/Location of Program/Hospital:[facility], [facility], [facility], [facility]   How Long Were You There? unknown   When Were You Discharged?  (unknown)  Have  You Ever Received Services From [facility] Before? Yes   Who Do You See at [facility]? ED  Have You Recently Had Any Thoughts About Hurting Yourself? No   Are You Planning to Commit Suicide/Harm Yourself At This time?  No  Have you Recently Had Thoughts About Hurting Someone [name]? No   Explanation: No data recorded Have You Used Any Alcohol or Drugs in the Past 24 Hours? No   How Long Ago Did You Use Drugs or Alcohol?  No data recorded  What Did You Use and How Much? No data  recorded What Do You Feel Would Help You the Most Today? Therapy; Medication  Do You Currently Have a Therapist/Psychiatrist? No   Name of Therapist/Psychiatrist: No data recorded  Have You Been Recently Discharged From Any Office Practice or Programs? No   Explanation of Discharge From Practice/Program:  No data recorded    CCA Screening Triage Referral Assessment Type of Contact: Tele-Assessment   Is this Initial or Reassessment? Initial Assessment   Date Telepsych consult ordered in [facility]:  10/16/2020   Time Telepsych consult ordered in [facility]:  1430  Patient Reported Information Reviewed? Yes   Patient Left Without Being Seen? No data recorded  Reason for Not Completing Assessment: No data recorded Collateral Involvement: None provided  Does Patient Have a Court Appointed Legal Guardian? No data recorded  Name and Contact of Legal Guardian:  No data recorded If Minor and Not Living with Parent(s), Who has Custody? n/a  Is CPS involved or ever been involved? Never  Is APS involved or ever been involved? Never  Patient Determined To Be At Risk for Harm To Self or Others Based on Review of Patient Reported Information or Presenting Complaint? No   Method: No data recorded  Availability of Means: No data recorded  Intent: No data recorded  Notification Required: No data recorded  Additional Information for Danger to Others Potential:  No data recorded  Additional Comments for Danger to Others Potential:  No data recorded  Are There Guns or Other Weapons in Your Home?  No data recorded   Types of Guns/Weapons: No data recorded   Are These Weapons Safely Secured?                              No data recorded   Who Could Verify You Are Able To Have These Secured:    No data recorded Do You Have any Outstanding Charges, Pending Court Dates, Parole/Probation? No data recorded Contacted To Inform of Risk of Harm To Self or Others: No data recorded Location of Assessment: [facility]  ED  Does Patient Present under Involuntary Commitment? Yes   IVC Papers Initial File Date: 10/16/2020   [place] of Residence: [place]  Patient Currently Receiving the Following Services: Not Receiving Services   Determination of Need: Emergent (2 hours)   Options For Referral: Inpatient Hospitalization; Medication Management; Outpatient Therapy   Opal Sidles, LCSWA

## 2020-10-16 NOTE — ED Notes (Signed)
IVC  CONSULT  DONE  PENDING  PLACEMENT 
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
## 2020-10-16 NOTE — BH Assessment (Signed)
Referral information for Psychiatric Hospitalization faxed to:   [facility] [phone]- [address])   [address] [phone]),   [facility] [phone] -or- [phone]),   [facility] [phone]),   [facility] [phone] or [phone])   [facility] [phone] or [phone]),   [facility] [phone])
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
## 2020-10-16 NOTE — ED Notes (Signed)
Report received from [place], [place].  Patient reported to be medicated around 630pm this evening. Patient noted to be sleeping, snoring heard from hallway. Rise and fall of chest noted.

## 2020-10-16 NOTE — BH Assessment (Signed)
PATIENT BED AVAILABLE AFTER 9AM on 10/17/20 and PENDING NEGATIVE COVID RESULTS  Patient has been accepted to [facility].  Patient assigned to [facility] Accepting physician is Dr. Estill Cotta.  Call report to [phone].  Representative was Rowena.   ER Staff is aware of it:  [facility] ER Secretary  Dr. Antoine Primas, ER MD  [facility] Patient's Nurse     Address: [address], [place]

## 2020-10-16 NOTE — ED Notes (Addendum)
Pt dressed out into blue scrubs. Personal belongings in two bags, sent to [facility]. Belongings inclue phone, wallet with cards, $20 bill, 10 $1 bills, pants, underwear, socks, boots, belt, shirt, car key.   Money witnessed/counted by Kristian Covey., RN

## 2020-10-17 NOTE — ED Notes (Signed)
Pt given breakfast tray

## 2020-10-17 NOTE — ED Provider Notes (Signed)
Emergency Medicine Observation Re-evaluation Note  Gregory Saunders is a 25 y.o. male, seen on rounds today.  Pt initially presented to the ED for complaints of Mental Health Problem Currently, the patient is calmly resting.  Physical Exam  BP 131/83 (BP Location: Left Arm)   Pulse 81   Temp 97.9 F (36.6 C) (Oral)   Resp 18   Ht 6\' 1"  (1.854 m)   Wt 99.8 kg   SpO2 100%   BMI 29.03 kg/m  Physical Exam General: resting calmly Cardiac: regular rate Lungs: no increased work of breathing Psych: calm  ED Course / MDM  I have reviewed the labs performed to date as well as medications administered while in observation.    Plan  Current plan is for psychiatric disposition. Patient is under full IVC at this time.   , MD 10/17/20 [phone]

## 2020-10-17 NOTE — ED Notes (Signed)
IVC/pending transport to [facility] after 9 AM.

## 2021-06-20 ENCOUNTER — Other Ambulatory Visit: Payer: Self-pay

## 2021-06-20 ENCOUNTER — Emergency Department (HOSPITAL_COMMUNITY)
Admission: EM | Admit: 2021-06-20 | Discharge: 2021-06-21 | Disposition: A | Discharge status: Home / Self Care | Payer: No Typology Code available for payment source | Attending: Emergency Medicine | Admitting: Emergency Medicine

## 2021-06-20 ENCOUNTER — Encounter (HOSPITAL_COMMUNITY): Payer: Self-pay

## 2021-06-20 DIAGNOSIS — F17210 Nicotine dependence, cigarettes, uncomplicated: Secondary | ICD-10-CM | POA: Diagnosis not present

## 2021-06-20 DIAGNOSIS — Y900 Blood alcohol level of less than 20 mg/100 ml: Secondary | ICD-10-CM | POA: Insufficient documentation

## 2021-06-20 DIAGNOSIS — F209 Schizophrenia, unspecified: Secondary | ICD-10-CM | POA: Insufficient documentation

## 2021-06-20 DIAGNOSIS — Z9114 Patient's other noncompliance with medication regimen: Secondary | ICD-10-CM

## 2021-06-20 LAB — COMPREHENSIVE METABOLIC PANEL
ALT: 57 U/L — ABNORMAL HIGH (ref 0–44)
AST: 46 U/L — ABNORMAL HIGH (ref 15–41)
Albumin: 4.3 g/dL (ref 3.5–5.0)
Alkaline Phosphatase: 63 U/L (ref 38–126)
Anion gap: 8 (ref 5–15)
BUN: 18 mg/dL (ref 6–20)
CO2: 26 mmol/L (ref 22–32)
Calcium: 9 mg/dL (ref 8.9–10.3)
Chloride: 105 mmol/L (ref 98–111)
Creatinine, Ser: 1.46 mg/dL — ABNORMAL HIGH (ref 0.61–1.24)
GFR, Estimated: >60 mL/min (ref >60)
Glucose, Bld: 81 mg/dL (ref 70–99)
Potassium: 3.8 mmol/L (ref 3.5–5.1)
Sodium: 139 mmol/L (ref 135–145)
Total Bilirubin: 1 mg/dL (ref 0.3–1.2)
Total Protein: 7.5 g/dL (ref 6.5–8.1)

## 2021-06-20 LAB — CBC
HCT: 42 % (ref 39.0–52.0)
Hemoglobin: 13.9 g/dL (ref 13.0–17.0)
MCH: 28.2 pg (ref 26.0–34.0)
MCHC: 33.1 g/dL (ref 30.0–36.0)
MCV: 85.2 fL (ref 80.0–100.0)
Platelets: 231 10*3/uL (ref 150–400)
RBC: 4.93 MIL/uL (ref 4.22–5.81)
RDW: 13 % (ref 11.5–15.5)
WBC: 4.1 10*3/uL (ref 4.0–10.5)
nRBC: 0 % (ref 0.0–0.2)

## 2021-06-20 LAB — ETHANOL: Alcohol, Ethyl (B): <10 mg/dL (ref <10)

## 2021-06-20 MED ORDER — OLANZAPINE 5 MG PO TABS
10.0000 mg | ORAL_TABLET | Freq: Every day | ORAL | Status: DC
  Administered 2021-06-20: 10 mg via ORAL
  Filled 2021-06-20: qty 2

## 2021-06-20 MED ORDER — OLANZAPINE 10 MG PO TABS: 10.0000 mg | ORAL_TABLET | Freq: Every day | ORAL | 2 refills | Status: DC

## 2021-06-20 NOTE — ED Triage Notes (Signed)
Pt presents to ED, states he has a court date on Tuesday and he is willing to stay here until Monday and he needs a mental health check up. Pt denies SI/HI but states he needs some medications adjustments and he is schizophrenic and it is a good idea for him to stay here.

## 2021-06-20 NOTE — ED Provider Notes (Signed)
[facility] EMERGENCY DEPARTMENT Provider Note   CSN: [id number] Arrival date & time: 06/20/21  1637     History Chief Complaint  Patient presents with   Medical Clearance    Gregory Saunders is a 25 y.o. male.  Pt reports he has schizophrenia and is not taking his medications.  Pt reports he thinks he needs to get back on his medications.  Pt reports he needs respiradone and ambien. Pt denies depression, suicidal thoughts or homicidal thoughts.  Pt reports no one is with him.  The history is provided by the patient. No language interpreter was used.      Past Medical History:  Diagnosis Date   Schizophrenia [facility])     Patient Active Problem List   Diagnosis Date Noted   Schizoaffective disorder, bipolar type (HCC) 10/16/2020   Cannabis abuse 10/16/2020    Past Surgical History:  Procedure Laterality Date   stye removal     WISDOM TOOTH EXTRACTION         No family history on file.  Social History   Tobacco Use   Smoking status: Every Day    Packs/day: 0.50    Types: Cigarettes   Smokeless tobacco: Never  Vaping Use   Vaping Use: Former  Substance Use Topics   Alcohol use: Never   Drug use: Not Currently    Types: Marijuana    Home Medications Prior to Admission medications   Medication Sig Start Date End Date Taking? Authorizing Provider  Omega-3 Fatty Acids (OMEGA-3 FISH OIL PO) Take 1 capsule by mouth daily.    [provider]  OVER THE COUNTER MEDICATION Take 1 tablet by mouth daily. En-Lite (iron supplement)    [provider]  Paliperidone Palmitate (INVEGA SUSTENNA IM) Inject into the muscle every 30 (thirty) days.    [provider]    Allergies    Amoxicillin and Penicillins  Review of Systems   Review of Systems  All other systems reviewed and are negative.  Physical Exam Updated Vital Signs BP 113/65 (BP Location: Right Arm)   Pulse 93   Temp 99.1 F (37.3 C)   Resp 17   Ht 6' 0.25" (1.835 m)   Wt  84.8 kg   SpO2 98%   BMI 25.19 kg/m   Physical Exam Vitals and nursing note reviewed.  Constitutional:      Appearance: He is well-developed.  HENT:     Head: Normocephalic and atraumatic.  Eyes:     Conjunctiva/sclera: Conjunctivae normal.  Cardiovascular:     Rate and Rhythm: Normal rate and regular rhythm.     Heart sounds: No murmur heard. Pulmonary:     Effort: Pulmonary effort is normal. No respiratory distress.     Breath sounds: Normal breath sounds.  Musculoskeletal:        General: Normal range of motion.     Cervical back: Neck supple.  Skin:    General: Skin is warm and dry.  Neurological:     Mental Status: He is alert.    ED Results / Procedures / Treatments   Labs (all labs ordered are listed, but only abnormal results are displayed) Labs Reviewed  COMPREHENSIVE METABOLIC PANEL - Abnormal; Notable for the following components:      Result Value   Creatinine, Ser 1.46 (*)    AST 46 (*)    ALT 57 (*)    All other components within normal limits  ETHANOL  CBC  RAPID URINE DRUG SCREEN, HOSP  PERFORMED    EKG None  Radiology No results found.  Procedures Procedures   Medications Ordered in ED Medications - No data to display  ED Course  I have reviewed the triage vital signs and the nursing notes.  Pertinent labs & imaging results that were available during my care of the patient were reviewed by me and considered in my medical decision making (see chart for details).    MDM Rules/Calculators/A&P                           MDM:  records reviewed  Pt has not had prescriptions for ambien.  Pt has been treated with olanzapine and invega in the past.  I will give pt invega and reasess.  Pt reports he can fill his medications   Final Clinical Impression(s) / ED Diagnoses Final diagnoses:  Schizophrenia, unspecified type (HCC)  Noncompliance with medication regimen    Rx / DC Orders ED Discharge Orders     None     An After Visit Summary  was printed and given to the patient.    Osie Cheeks 06/20/21 Montez Morita, MD 06/23/21 1007

## 2021-06-21 ENCOUNTER — Emergency Department (HOSPITAL_COMMUNITY)
Admission: EM | Admit: 2021-06-21 | Discharge: 2021-06-21 | Disposition: A | Discharge status: Home / Self Care | Payer: No Typology Code available for payment source | Source: Home / Self Care | Attending: Emergency Medicine | Admitting: Emergency Medicine

## 2021-06-21 ENCOUNTER — Other Ambulatory Visit: Payer: Self-pay

## 2021-06-21 ENCOUNTER — Encounter (HOSPITAL_COMMUNITY): Payer: Self-pay | Admitting: Emergency Medicine

## 2021-06-21 DIAGNOSIS — Z008 Encounter for other general examination: Secondary | ICD-10-CM | POA: Insufficient documentation

## 2021-06-21 DIAGNOSIS — F17210 Nicotine dependence, cigarettes, uncomplicated: Secondary | ICD-10-CM | POA: Insufficient documentation

## 2021-06-21 NOTE — ED Provider Notes (Signed)
[facility]-EMERGENCY DEPT [facility] Emergency Department Provider Note MRN:  [id number]  Arrival date & time: 06/21/21     Chief Complaint   Medical clearance History of Present Illness   Gregory Saunders is a 25 y.o. year-old male with a history of schizophrenia presenting to the ED with chief complaint of medical clearance.  Patient here "to be committed".  Has been off of his schizophrenia medications for a while, had them refilled here in the emergency department earlier today.  He was unable to find a ride back home and so now he is checking back in "to be committed".  When asked why, he said he needs to be restarted on his medicines.  Denies chest pain or shortness of breath, no pain, no drug or alcohol use.  Review of Systems  A complete 10 system review of systems was obtained and all systems are negative except as noted in the HPI and PMH.   Patient's Health History    Past Medical History:  Diagnosis Date   Schizophrenia [facility])     Past Surgical History:  Procedure Laterality Date   stye removal     WISDOM TOOTH EXTRACTION      History reviewed. No pertinent family history.  Social History   Socioeconomic History   Marital status: Single    Spouse name: Not on file   Number of children: Not on file   Years of education: Not on file   Highest education level: Not on file  Occupational History   Not on file  Tobacco Use   Smoking status: Every Day    Packs/day: 0.50    Types: Cigarettes   Smokeless tobacco: Never  Vaping Use   Vaping Use: Former  Substance and Sexual Activity   Alcohol use: Never   Drug use: Not Currently    Types: Marijuana   Sexual activity: Not on file  Other Topics Concern   Not on file  Social History Narrative   Not on file   Social Determinants of Health   Financial Resource Strain: Not on file  Food Insecurity: Not on file  Transportation Needs: Not on file  Physical Activity: Not on file  Stress: Not on file  Social  Connections: Not on file  Intimate Partner Violence: Not on file     Physical Exam   Vitals:   06/21/21 0142  BP: 123/74  Pulse: 76  Resp: 18  Temp: 98.2 F (36.8 C)  SpO2: 99%    CONSTITUTIONAL: Well-appearing, NAD NEURO:  Alert and oriented x 3, no focal deficits EYES:  eyes equal and reactive ENT/NECK:  no LAD, no JVD CARDIO: Regular rate, well-perfused, normal S1 and S2 PULM:  CTAB no wheezing or rhonchi GI/GU:  normal bowel sounds, non-distended, non-tender MSK/SPINE:  No gross deformities, no edema SKIN:  no rash, atraumatic PSYCH:  Appropriate speech and behavior  *Additional and/or pertinent findings included in MDM below  Diagnostic and Interventional Summary    EKG Interpretation  Date/Time:    Ventricular Rate:    PR Interval:    QRS Duration:   QT Interval:    QTC Calculation:   R Axis:     Text Interpretation:         Labs Reviewed - No data to display  No orders to display    Medications - No data to display   Procedures  /  Critical Care Procedures  ED Course and Medical Decision Making  I have reviewed the triage vital signs,  the nursing notes, and pertinent available records from the EMR.  Listed above are laboratory and imaging tests that I personally ordered, reviewed, and interpreted and then considered in my medical decision making (see below for details).  Patient seems to be checking regularly emergency department for convenience.  Does not have a ride home.  Has a history of schizophrenia and had a med refill earlier today.  He denied any SI or HI or AVH during that emergency department visit.  During the initial screening for this ED registration, he also denied SI or HI or AVH.  Once we explained that getting restarted on medicines is not a reason for being committed, he is now reporting suicidal ideation, has no details or specific ways he would harm himself, describes it as his mind being crazy.  This is consistent with malingering  for secondary gain, namely housing or a place to stay as he has nowhere else to go.  And he admits this, that he could not find a ride.  He is not in a psychiatric crisis at this time, does not seem to be a risk of harm to self or others, appropriate for discharge.       Elmer Sow. Pilar Plate, MD [facility] Emergency Medicine [facility] [email or web address]  Final Clinical Impressions(s) / ED Diagnoses     ICD-10-CM   1. Encounter for psychological evaluation  Z00.8       ED Discharge Orders     None        Discharge Instructions Discussed with and Provided to Patient:    Discharge Instructions      You were evaluated in the Emergency Department and after careful evaluation, we did not find any emergent condition requiring admission or further testing in the hospital.  Your exam/testing today was overall reassuring.  Recommend filling your prescriptions and following up with a counselor or mental health specialist.  Please return to the Emergency Department if you experience any worsening of your condition.  Thank you for allowing [place] to be a part of your care.        Sabas Sous, MD 06/21/21 [phone]

## 2021-06-21 NOTE — Discharge Instructions (Addendum)
You were evaluated in the Emergency Department and after careful evaluation, we did not find any emergent condition requiring admission or further testing in the hospital.  Your exam/testing today was overall reassuring.  Recommend filling your prescriptions and following up with a counselor or mental health specialist.  Please return to the Emergency Department if you experience any worsening of your condition.  Thank you for allowing [place] to be a part of your care.

## 2021-06-21 NOTE — ED Triage Notes (Signed)
Pt just discharged from ED after evaluation for medical clearance and states he is now suicidal.

## 2022-01-12 ENCOUNTER — Ambulatory Visit (INDEPENDENT_AMBULATORY_CARE_PROVIDER_SITE_OTHER): Payer: Medicaid Other | Admitting: Internal Medicine

## 2022-01-12 VITALS — BP 141/94 | HR 91 | Resp 16 | Ht 74.0 in | Wt 261.0 lb

## 2022-01-12 DIAGNOSIS — G4733 Obstructive sleep apnea (adult) (pediatric): Secondary | ICD-10-CM | POA: Diagnosis not present

## 2022-01-12 DIAGNOSIS — G4710 Hypersomnia, unspecified: Secondary | ICD-10-CM | POA: Insufficient documentation

## 2022-01-12 NOTE — Progress Notes (Signed)
Sleep Medicine  ? ?Office Visit ? ?Patient Name: Gregory Saunders ?DOB: 01/12/1996 ?MRN [id number] ? ? ? ?Chief Complaint: evaluate for sleep apnea ? ?Brief History: ? ?Davine presents with a 2 month history of EDS all day and very loud snoring.  Sleep quality is very poor every night. The patient goes to sleep at 930 pm and wakes up at 6 am.  Patient  says he literally tosses and turns every night, but denies any other symptoms,  Patient has noted no restlessness of his legs at night.  The patient  relates no unusual behavior during the night.  The patient reports a history of psychiatric problems. The Epworth Sleepiness Score is 5 out of 24 .  The patient relates  Cardiovascular risk factors include: none  ? ? ?ROS ? ?General: (-) fever, (-) chills, (-) night sweat ?Nose and Sinuses: (-) nasal stuffiness or itchiness, (-) postnasal drip, (-) nosebleeds, (-) sinus trouble. ?Mouth and Throat: (-) sore throat, (-) hoarseness. ?Neck: (-) swollen glands, (-) enlarged thyroid, (-) neck pain. ?Respiratory: - cough, - shortness of breath, - wheezing. ?Neurologic: - numbness, - tingling. ?Psychiatric: + anxiety, + depression ?Sleep behavior: -sleep paralysis -hypnogogic hallucinations -dream enactment  ?    -vivid dreams -cataplexy -night terrors -sleep walking ? ? ?Current Medication: ?Outpatient Encounter Medications as of 01/12/2022  ?Medication Sig Note  ? hydrOXYzine (VISTARIL) 25 MG capsule 1 capsule at bedtime as needed   ? OLANZapine (ZYPREXA) 15 MG tablet Take by mouth.   ? benztropine (COGENTIN) 1 MG tablet 1 tablet at bedtime.   ? fluticasone (FLONASE ALLERGY RELIEF) 50 MCG/ACT nasal spray 1 spray in each nostril   ? OLANZapine (ZYPREXA) 10 MG tablet Take 1 tablet (10 mg total) by mouth at bedtime.   ? Omega 3 1000 MG CAPS 1 capsule   ? Omega-3 Fatty Acids (OMEGA-3 FISH OIL PO) Take 1 capsule by mouth daily.   ? OVER THE COUNTER MEDICATION Take 1 tablet by mouth daily. En-Lite (iron supplement)   ? risperiDONE  (RISPERDAL) 3 MG tablet 1 tablet   ? [DISCONTINUED] Paliperidone Palmitate (INVEGA SUSTENNA IM) Inject into the muscle every 30 (thirty) days. 10/23/2018: Last Injection 08/31/2018 per patient   ? ?No facility-administered encounter medications on file as of 01/12/2022.  ? ? ?Surgical History: ?Past Surgical History:  ?Procedure Laterality Date  ? stye removal    ? WISDOM TOOTH EXTRACTION    ? ? ?Medical History: ?Past Medical History:  ?Diagnosis Date  ? Schizophrenia (HCC)   ? ? ?Family History: ?Non contributory to the present illness ? ?Social History: ?Social History  ? ?Socioeconomic History  ? Marital status: Single  ?  Spouse name: Not on file  ? Number of children: Not on file  ? Years of education: Not on file  ? Highest education level: Not on file  ?Occupational History  ? Not on file  ?Tobacco Use  ? Smoking status: Every Day  ?  Packs/day: 0.50  ?  Types: Cigarettes  ? Smokeless tobacco: Never  ?Vaping Use  ? Vaping Use: Former  ?Substance and Sexual Activity  ? Alcohol use: Never  ? Drug use: Not Currently  ?  Types: Marijuana  ? Sexual activity: Not on file  ?Other Topics Concern  ? Not on file  ?Social History Narrative  ? Not on file  ? ?Social Determinants of Health  ? ?Financial Resource Strain: Not on file  ?Food Insecurity: Not on file  ?Transportation Needs: Not on  file  ?Physical Activity: Not on file  ?Stress: Not on file  ?Social Connections: Not on file  ?Intimate Partner Violence: Not on file  ? ? ?Vital Signs: ?Blood pressure (!) 141/94, pulse 91, resp. rate 16, height 6\' 2"  (1.88 m), weight 261 lb (118.4 kg), SpO2 98 %. ?Body mass index is 33.51 kg/m?.  ? ?Examination: ?General Appearance: The patient is well-developed, well-nourished, and in no distress. ?Neck Circumference: 44cm ?Skin: Gross inspection of skin unremarkable. ?Head: normocephalic, no gross deformities. ?Eyes: no gross deformities noted. ?ENT: ears appear grossly normal ?Neurologic: Alert and oriented. No involuntary  movements. ? ? ? ?EPWORTH SLEEPINESS SCALE: ? ?Scale:  ?(0)= no chance of dozing; (1)= slight chance of dozing; (2)= moderate chance of dozing; (3)= high chance of dozing ? ?Chance  Situtation ?   ?Sitting and reading: 0 ?  ? Watching TV: 2 ?   ?Sitting Inactive in public: 0 ?   ?As a passenger in car: 1   ?   ?Lying down to rest: 2 ?   ?Sitting and talking: 0 ?   ?Sitting quielty after lunch: 0 ?   ?In a car, stopped in traffic: 0 ? ? ?TOTAL SCORE:   5 out of 24 ? ? ? ?SLEEP STUDIES: ? ?No studies on file ? ? ?LABS: ?No results found for this or any previous visit (from the past 2160 hour(s)). ? ?Radiology: ?No results found. ? ?No results found. ? ?No results found. ? ? ? ?Assessment and Plan: ?Patient Active Problem List  ? Diagnosis Date Noted  ? OSA (obstructive sleep apnea) 01/12/2022  ? Hypersomnia 01/12/2022  ? Schizoaffective disorder, bipolar type (HCC) 10/16/2020  ? Cannabis abuse 10/16/2020  ? ?1. OSA (obstructive sleep apnea) ?PLAN OSA:  ? ?Patient evaluation suggests high risk of sleep disordered breathing due to snoring and significant daytime sleepiness.  Suggest: PSG  to assess the patient's sleep disordered breathing. The patient was also counselled on weight loss to optimize sleep health. ? ? ?2. Hypersomnia ?This may be partially related to his mental illness or to his medications which have been changed. Will consider an MSLT if PSG negative and symptoms persist.   ? ? ?General Counseling: I have discussed the findings of the evaluation and examination with Latron.  I have also discussed any further diagnostic evaluation thatmay be needed or ordered today. Tavis verbalizes understanding of the findings of todays visit. We also reviewed his medications today and discussed drug interactions and side effects including but not limited excessive drowsiness and altered mental states. We also discussed that there is always a risk not just to him but also people around him. he has been encouraged to  call the office with any questions or concerns that should arise related to todays visit. ? ?No orders of the defined types were placed in this encounter. ?  ? ? ? ? ?I have personally obtained a history, evaluated the patient, evaluated pertinent data, formulated the assessment and plan and placed orders. ? ?This patient was seen today by Jackelyn Hoehn, PA-C in collaboration with Dr. Emmaline Kluver.  ?  ?Freda Munro, MD FCCP ?Diplomate ABMS ?Pulmonary and Critical Care Medicine ?Sleep medicine  ?

## 2022-06-08 ENCOUNTER — Other Ambulatory Visit: Payer: Self-pay

## 2022-06-08 ENCOUNTER — Encounter (HOSPITAL_COMMUNITY): Payer: Self-pay

## 2022-06-08 ENCOUNTER — Emergency Department (HOSPITAL_COMMUNITY)
Admission: EM | Admit: 2022-06-08 | Discharge: 2022-06-13 | Disposition: A | Discharge status: Other Acute Inpatient Hospital | Payer: No Typology Code available for payment source | Attending: Emergency Medicine | Admitting: Emergency Medicine

## 2022-06-08 DIAGNOSIS — F250 Schizoaffective disorder, bipolar type: Secondary | ICD-10-CM | POA: Diagnosis present

## 2022-06-08 DIAGNOSIS — R4689 Other symptoms and signs involving appearance and behavior: Secondary | ICD-10-CM

## 2022-06-08 DIAGNOSIS — Y900 Blood alcohol level of less than 20 mg/100 ml: Secondary | ICD-10-CM | POA: Diagnosis not present

## 2022-06-08 DIAGNOSIS — F1210 Cannabis abuse, uncomplicated: Secondary | ICD-10-CM | POA: Diagnosis not present

## 2022-06-08 DIAGNOSIS — F03918 Unspecified dementia, unspecified severity, with other behavioral disturbance: Secondary | ICD-10-CM

## 2022-06-08 DIAGNOSIS — F209 Schizophrenia, unspecified: Secondary | ICD-10-CM | POA: Diagnosis not present

## 2022-06-08 DIAGNOSIS — Z20822 Contact with and (suspected) exposure to covid-19: Secondary | ICD-10-CM | POA: Diagnosis not present

## 2022-06-08 DIAGNOSIS — F17210 Nicotine dependence, cigarettes, uncomplicated: Secondary | ICD-10-CM | POA: Diagnosis not present

## 2022-06-08 DIAGNOSIS — F1290 Cannabis use, unspecified, uncomplicated: Secondary | ICD-10-CM | POA: Diagnosis present

## 2022-06-08 MED ORDER — OLANZAPINE 5 MG PO TABS
10.0000 mg | ORAL_TABLET | Freq: Every day | ORAL | Status: DC
  Administered 2022-06-09 – 2022-06-13 (×6): 10 mg via ORAL
  Filled 2022-06-08 (×6): qty 2

## 2022-06-08 MED ORDER — RISPERIDONE 1 MG PO TABS: 3.0000 mg | ORAL_TABLET | Freq: Every day | ORAL | Status: DC

## 2022-06-08 MED ORDER — HYDROXYZINE HCL 25 MG PO TABS: 25.0000 mg | ORAL_TABLET | Freq: Three times a day (TID) | ORAL | Status: DC | PRN

## 2022-06-08 MED ORDER — HYDROXYZINE PAMOATE 25 MG PO CAPS: 25.0000 mg | ORAL_CAPSULE | Freq: Three times a day (TID) | ORAL | Status: DC | PRN

## 2022-06-08 MED ORDER — RISPERIDONE 1 MG PO TABS
3.0000 mg | ORAL_TABLET | Freq: Every day | ORAL | Status: DC
  Administered 2022-06-08 – 2022-06-11 (×4): 3 mg via ORAL
  Filled 2022-06-08 (×5): qty 3

## 2022-06-08 MED ORDER — BENZTROPINE MESYLATE 1 MG PO TABS
1.0000 mg | ORAL_TABLET | Freq: Every day | ORAL | Status: DC
  Administered 2022-06-09 – 2022-06-13 (×6): 1 mg via ORAL
  Filled 2022-06-08 (×6): qty 1

## 2022-06-08 NOTE — ED Provider Notes (Signed)
[facility] EMERGENCY DEPARTMENT Provider Note   CSN: [id number] Arrival date & time: 06/08/22  2008     History  Chief Complaint  Patient presents with   Medical Clearance    Gregory Saunders is a 26 y.o. male w/ hx of depression, schizophrenia, presenting from home with behavioral outburst.    His foster mother Dawit Tankard by phone reports this evening the patient's anger was getting progressively worse, talking abnormally, and taking his evening medications.  Patient got aggressive up close with his foster father.  His mother reports she got him a nicotine vape recently (pt smokes cigarettes).  HPI     Home Medications Prior to Admission medications   Medication Sig Start Date End Date Taking? Authorizing Provider  benztropine (COGENTIN) 1 MG tablet 1 tablet at bedtime.    [provider]  fluticasone Aleda Grana ALLERGY RELIEF) 50 MCG/ACT nasal spray 1 spray in each nostril    [provider]  hydrOXYzine (VISTARIL) 25 MG capsule 1 capsule at bedtime as needed 08/28/21   [provider]  OLANZapine (ZYPREXA) 10 MG tablet Take 1 tablet (10 mg total) by mouth at bedtime. 06/20/21 06/20/22  Elson Areas, PA-C  OLANZapine (ZYPREXA) 15 MG tablet Take by mouth. 03/30/19   [provider]  Omega 3 1000 MG CAPS 1 capsule    [provider]  Omega-3 Fatty Acids (OMEGA-3 FISH OIL PO) Take 1 capsule by mouth daily.    [provider]  OVER THE COUNTER MEDICATION Take 1 tablet by mouth daily. En-Lite (iron supplement)    [provider]  risperiDONE (RISPERDAL) 3 MG tablet 1 tablet    [provider]      Allergies    Amoxicillin, Peanut (diagnostic), and Penicillins    Review of Systems   Review of Systems  Physical Exam Updated Vital Signs BP (!) 153/91   Pulse 99   Temp 98.6 F (37 C) (Oral)   Resp 19   Ht 6\' 2"  (1.88 m)   Wt 124.7 kg   SpO2 95%   BMI 35.31 kg/m  Physical Exam  ED Results /  Procedures / Treatments   Labs (all labs ordered are listed, but only abnormal results are displayed) Labs Reviewed - No data to display  EKG None  Radiology No results found.  Procedures Procedures    Medications Ordered in ED Medications - No data to display  ED Course/ Medical Decision Making/ A&P                           Medical Decision Making  This patient presents to the Emergency Department with complaint of psychiatric disturbance. This involves an extensive number of treatment options, and is a complaint that carries with it a high risk of complications and morbidity.   Per my phone discussion with his mother--  His mother reports concerned that his mental health may be worsening, as the patient has had erratic behavior, speaking in strange voices, and was incredibly aggressive at home.  She says he has a low fuse and seems to go off on her and her husband for minor issues.  His mother and foster father do not feel comfortable with him returning to the house anymore, so they fear for their safety.  His foster father is physically handicapped.  They understand that the patient is no longer a minor and the alternative option may be homelessness or referral to a shelter,  if he is felt to be medically stable.  The patient does appear to demonstrate insight into his medical condition, and is able to explain to me his prior medications as well as his history of schizophrenia.  We will perform medical clearance on the patient and check blood work.  I think is reasonable to have a behavioral health or TTS evaluation in the morning, particularly to reconcile his medications, to ensure that his mood remains stable after observation for 24 hours in the ED.  At that point if his parents are not willing to take him back home, the patient will be referred to a shelter.  At this time, the patient is NOT under IVC.  There is no ground for IVC at this time - he is not psychotic, no  SI/HI.  His parents understand that if he were to choose to leave, he would be free to do so          Final Clinical Impression(s) / ED Diagnoses Final diagnoses:  None    Rx / DC Orders ED Discharge Orders     None         Terald Sleeper, MD 06/08/22 2311

## 2022-06-08 NOTE — ED Triage Notes (Signed)
Pt brought in by [facility], per ems pts parents were concerned with pts behavior as he has not been taking his medications as he should.   Pt states he has been very stressed lately and hasn't been taking his medication because his parents haven't gotten them filled. Reports he is disabled due to his schizophrenia and his parents control his money. Says he tried to stand up for himself and got into an argument with his parents tonight and that is why they called 911. Denies SI & HI but would like help with his anxiety.   Pt is calm and cooperative in triage.

## 2022-06-09 LAB — COMPREHENSIVE METABOLIC PANEL
ALT: 42 U/L (ref 0–44)
AST: 24 U/L (ref 15–41)
Albumin: 4.2 g/dL (ref 3.5–5.0)
Alkaline Phosphatase: 58 U/L (ref 38–126)
Anion gap: 6 (ref 5–15)
BUN: 18 mg/dL (ref 6–20)
CO2: 23 mmol/L (ref 22–32)
Calcium: 9.7 mg/dL (ref 8.9–10.3)
Chloride: 107 mmol/L (ref 98–111)
Creatinine, Ser: 1.33 mg/dL — ABNORMAL HIGH (ref 0.61–1.24)
GFR, Estimated: >60 mL/min (ref >60)
Glucose, Bld: 105 mg/dL — ABNORMAL HIGH (ref 70–99)
Potassium: 3.8 mmol/L (ref 3.5–5.1)
Sodium: 136 mmol/L (ref 135–145)
Total Bilirubin: 0.6 mg/dL (ref 0.3–1.2)
Total Protein: 8 g/dL (ref 6.5–8.1)

## 2022-06-09 LAB — CBC WITH DIFFERENTIAL/PLATELET
Abs Immature Granulocytes: 0.02 10*3/uL (ref 0.00–0.07)
Basophils Absolute: 0 10*3/uL (ref 0.0–0.1)
Basophils Relative: 1 %
Eosinophils Absolute: 0.1 10*3/uL (ref 0.0–0.5)
Eosinophils Relative: 1 %
HCT: 40.4 % (ref 39.0–52.0)
Hemoglobin: 13.3 g/dL (ref 13.0–17.0)
Immature Granulocytes: 0 %
Lymphocytes Relative: 33 %
Lymphs Abs: 2.1 10*3/uL (ref 0.7–4.0)
MCH: 27.4 pg (ref 26.0–34.0)
MCHC: 32.9 g/dL (ref 30.0–36.0)
MCV: 83.3 fL (ref 80.0–100.0)
Monocytes Absolute: 0.5 10*3/uL (ref 0.1–1.0)
Monocytes Relative: 8 %
Neutro Abs: 3.7 10*3/uL (ref 1.7–7.7)
Neutrophils Relative %: 57 %
Platelets: 253 10*3/uL (ref 150–400)
RBC: 4.85 MIL/uL (ref 4.22–5.81)
RDW: 13.7 % (ref 11.5–15.5)
WBC: 6.5 10*3/uL (ref 4.0–10.5)
nRBC: 0 % (ref 0.0–0.2)

## 2022-06-09 LAB — RAPID URINE DRUG SCREEN, HOSP PERFORMED
Amphetamines: NOT DETECTED
Barbiturates: NOT DETECTED
Benzodiazepines: NOT DETECTED
Cocaine: NOT DETECTED
Opiates: NOT DETECTED
Tetrahydrocannabinol: POSITIVE — AB

## 2022-06-09 LAB — ETHANOL: Alcohol, Ethyl (B): <10 mg/dL (ref <10)

## 2022-06-09 NOTE — ED Notes (Signed)
Pt being TTS at this time  

## 2022-06-09 NOTE — Consult Note (Signed)
Telepsych Consultation   Reason for Consult: Mental health or behavioral disturbance history of schizophrenia with violent outbursts at home, erratic behavior Referring Physician: Dr. Renaye Rakersrifan [facility] of [facility]  Patient Identification: Gregory Saunders Planck MRN:  [id number] Principal Diagnosis: <principal problem not specified> Schizophrenia Diagnosis:  Active Problems:   * No active hospital problems. * Erratic behavior  Total Time spent with patient: 1 hour  Subjective:   Gregory Saunders Resnik is Saunders 26 y.o. male patient.  HPI:  " Gretel AcreJosiah Saunders is Saunders 26 year old male presenting to [facility] due to not taking his medications. Patient denied SI, HI, psychosis and alcohol/drug usage. Patient reported that his parents didn't refill his medications. Patient reported he was having Saunders bad today, "I was already in Saunders bad mood today, I don't have access to my own disability money". Patient reported his main stressors are his parents and their threats. Patient reported he tried to stand up for himself and then got into argument with his parents and that is why they called the police. During assessment patient appeared to be responding to internal stimuli for approx 1 minute, mouthing words, along with exaggerated facial expressions. Patient then joined back in and completed assessment. Patient reported hearing voices and seeing spirits, "its Saunders gift, I have seeing spirits since I was Saunders baby". Patient unable to identify auditory voices. Patient reported he was seeing his grandmother located behind me. Patient reported worsening depressive symptoms regarding current situation. Patient reported inpatient psych treatment 07/2021. Patient denied prior suicide attempts and self-harming behaviors. Patient reported "I am part Creo, I'm Saunders demon so I don't sleep". Patient reported normal appetite. "   Assessment: On assessment today, patient is seen and examined  [facility].  Appears calm and participating in the examination.  Chart reviewed and findings shared with the treatment team and discussed with Dr. Octavia BrucknerMassengil via this note.  Patient alert and oriented x 4 to person, place, time, and situation.  Maintained good eye contact with the provider and speech clear and coherent.  Present with depressed mood and congruent affect.  Patient endorsed above HPI and added that is adopted, with his biological mother diagnosed with schizophrenia and mental retardation.  Thought process coherent with logical thought content.  During encounter patient seemed to look out intently as if responding to internal stimuli.  Abnormal lab of 06/08/2022 reviewed, CMP: Glucose 105, creatinine 1.33(H), increase p.o. fluid intake encourage.  Patient denies suicidal ideation, or homicidal ideation. Reports having visual hallucination by seeing the spirit world, and seeing his deceased grandmother behind this provider and saying "Lord have Mercy."  Reports hearing voices that are undifferentiated.  Patient reports he has been having hallucinations since he was Saunders young child, and claims it to be Saunders gift. Reports sleeping for 4 hours last night, reports having good appetite, reports being safe at home. Patient denies access to firearms, denies self-injurious behavior, denies use of drugs, denies alcohol consumption.  Patient reports smoking 1 pack of cigarette every 2 days and vaping 5000 puffs daily.  Instructions provided on cessation of substance use as it negatively impact overall psychiatric and medical wellbeing.  Patient nodded in agreement. Reports being followed by Saunders therapist and Saunders psychiatrist from [facility], who also manages his medications.   Disposition: Based on my examination of patient, with his erratic behavior, hallucinations, and failure to take his prescribed medications, patient is deemed unstable and  not psych cleared.  He is recommended for inpatient  psychiatric admission when medically stable.  Gregory Saunders, ED treatment team and Gregory Saunders, ED physician made aware of patient disposition.  Past Psychiatric History: Cannabis use, hypersomnia, schizoaffective disorder bipolar type, schizophrenia.  Risk to Self: Yes Risk to Others:  Yes Prior Inpatient Therapy: Yes  Prior Outpatient Therapy: Yes   Past Medical History:  Past Medical History:  Diagnosis Date   Schizophrenia [facility])     Past Surgical History:  Procedure Laterality Date   stye removal     WISDOM TOOTH EXTRACTION     Family History: No family history on file.  Family Psychiatric  History: Patient states biological mom diagnosed with mental retardation and schizophrenia.  Social History:  Social History   Substance and Sexual Activity  Alcohol Use Never     Social History   Substance and Sexual Activity  Drug Use Not Currently   Types: Marijuana    Social History   Socioeconomic History   Marital status: Single    Spouse name: Not on file   Number of children: Not on file   Years of education: Not on file   Highest education level: Not on file  Occupational History   Not on file  Tobacco Use   Smoking status: Every Day    Packs/day: 0.50    Types: Cigarettes   Smokeless tobacco: Never  Vaping Use   Vaping Use: Former  Substance and Sexual Activity   Alcohol use: Never   Drug use: Not Currently    Types: Marijuana   Sexual activity: Not on file  Other Topics Concern   Not on file  Social History Narrative   Not on file   Social Determinants of Health   Financial Resource Strain: Not on file  Food Insecurity: Not on file  Transportation Needs: Not on file  Physical Activity: Not on file  Stress: Not on file  Social Connections: Not on file   Additional Social History:    Allergies:   Allergies  Allergen Reactions   Amoxicillin     Childhood, possible rash    Peanut (Diagnostic) Other (See Comments)   Penicillins Other (See  Comments)    Unknown     Labs:  Results for orders placed or performed during the hospital encounter of 06/08/22 (from the past 48 hour(s))  Comprehensive metabolic panel     Status: Abnormal   Collection Time: 06/08/22 11:47 PM  Result Value Ref Range   Sodium 136 135 - 145 mmol/L   Potassium 3.8 3.5 - 5.1 mmol/L   Chloride 107 98 - 111 mmol/L   CO2 23 22 - 32 mmol/L   Glucose, Bld 105 (H) 70 - 99 mg/dL    Comment: Glucose reference range applies only to samples taken after fasting for at least 8 hours.   BUN 18 6 - 20 mg/dL   Creatinine, Ser 9.50 (H) 0.61 - 1.24 mg/dL   Calcium 9.7 8.9 - 93.2 mg/dL   Total Protein 8.0 6.5 - 8.1 g/dL   Albumin 4.2 3.5 - 5.0 g/dL   AST 24 15 - 41 U/L   ALT 42 0 - 44 U/L   Alkaline Phosphatase 58 38 - 126 U/L   Total Bilirubin 0.6 0.3 - 1.2 mg/dL   GFR, Estimated >67 >12 mL/min    Comment: (NOTE) Calculated using the CKD-EPI Creatinine Equation (2021)    Anion gap 6 5 - 15    Comment: Performed at [employer]  [facility], [address] [place], [place] [address]  Ethanol     Status: None   Collection Time: 06/08/22 11:47 PM  Result Value Ref Range   Alcohol, Ethyl (B) <10 <10 mg/dL    Comment: (NOTE) Lowest detectable limit for serum alcohol is 10 mg/dL.  For medical purposes only. Performed at [facility], [address] [place], [place] [address]   CBC with Diff     Status: None   Collection Time: 06/08/22 11:47 PM  Result Value Ref Range   WBC 6.5 4.0 - 10.5 K/uL   RBC 4.85 4.22 - 5.81 MIL/uL   Hemoglobin 13.3 13.0 - 17.0 g/dL   HCT 95.2 84.1 - 32.4 %   MCV 83.3 80.0 - 100.0 fL   MCH 27.4 26.0 - 34.0 pg   MCHC 32.9 30.0 - 36.0 g/dL   RDW 40.1 02.7 - 25.3 %   Platelets 253 150 - 400 K/uL   nRBC 0.0 0.0 - 0.2 %   Neutrophils Relative % 57 %   Neutro Abs 3.7 1.7 - 7.7 K/uL   Lymphocytes Relative 33 %   Lymphs Abs 2.1 0.7 - 4.0 K/uL   Monocytes Relative 8 %   Monocytes Absolute 0.5 0.1 - 1.0 K/uL   Eosinophils Relative 1 %    Eosinophils Absolute 0.1 0.0 - 0.5 K/uL   Basophils Relative 1 %   Basophils Absolute 0.0 0.0 - 0.1 K/uL   Immature Granulocytes 0 %   Abs Immature Granulocytes 0.02 0.00 - 0.07 K/uL    Comment: Performed at [facility], [address] [place], [place] [address]    Medications:  Current Facility-Administered Medications  Medication Dose Route Frequency Provider Last Rate Last Admin   benztropine (COGENTIN) tablet 1 mg  1 mg Oral QHS Terald Sleeper, MD   1 mg at 06/09/22 3474   hydrOXYzine (ATARAX) tablet 25 mg  25 mg Oral TID PRN Terald Sleeper, MD       OLANZapine (ZYPREXA) tablet 10 mg  10 mg Oral QHS Terald Sleeper, MD   10 mg at 06/09/22 0205   risperiDONE (RISPERDAL) tablet 3 mg  3 mg Oral QHS Terald Sleeper, MD   3 mg at 06/08/22 2301   Current Outpatient Medications  Medication Sig Dispense Refill   atorvastatin (LIPITOR) 20 MG tablet Take 20 mg by mouth daily.     clobetasol ointment (TEMOVATE) 0.05 % Apply 1 Application topically 2 (two) times daily.     montelukast (SINGULAIR) 10 MG tablet Take 10 mg by mouth at bedtime.     OLANZapine (ZYPREXA) 10 MG tablet Take 1 tablet (10 mg total) by mouth at bedtime. 30 tablet 2   risperiDONE (RISPERDAL) 3 MG tablet Take 3 mg by mouth 2 (two) times daily.     benztropine (COGENTIN) 1 MG tablet 1 tablet at bedtime. (Patient not taking: Reported on 06/09/2022)     hydrOXYzine (VISTARIL) 25 MG capsule 1 capsule at bedtime as needed (Patient not taking: Reported on 06/09/2022)      Musculoskeletal: Strength & Muscle Tone: within normal limits Gait & Station: normal Patient leans: N/Saunders  Psychiatric Specialty Exam:  Presentation  General Appearance: Appropriate for Environment; Casual; Fairly Groomed  Eye Contact:Good  Speech:Clear and Coherent; Normal Rate  Speech Volume:No data recorded Handedness:Right  Mood and Affect  Mood:Depressed  Affect:Blunt  Thought Process  Thought Processes:Coherent  Descriptions of  Associations:Intact  Orientation:Full (Time, Place and Person)  Thought Content:Logical  History of  Schizophrenia/Schizoaffective disorder:No data recorded Duration of Psychotic Symptoms:No data recorded Hallucinations:Hallucinations: Auditory; Visual Description of Auditory Hallucinations: Hearing voices differentiated Description of Visual Hallucinations: Seeing the spirit world.  Seeing his grandmother stand behind the provider and saying Mercy  Ideas of Reference:None  Suicidal Thoughts:Suicidal Thoughts: No  Homicidal Thoughts:Homicidal Thoughts: No  Sensorium  Memory:Immediate Fair; Remote Fair  Judgment:Fair  Insight:Fair  Executive Functions  Concentration:Fair  Attention Span:Fair  Recall:Fair  [employer] of Knowledge:Fair  Language:Good  Psychomotor Activity  Psychomotor Activity:Psychomotor Activity: Normal  Assets  Assets:Communication Skills; Housing; Physical Health; Social Support  Sleep  Sleep:Sleep: Fair Number of Hours of Sleep: 4  Physical Exam: Physical Exam Vitals and nursing note reviewed.  Constitutional:      Appearance: Normal appearance.  HENT:     Right Ear: External ear normal.     Left Ear: External ear normal.     Nose: Nose normal.     Mouth/Throat:     Mouth: Mucous membranes are moist.     Pharynx: Oropharynx is clear.  Eyes:     Extraocular Movements: Extraocular movements intact.     Conjunctiva/sclera: Conjunctivae normal.     Pupils: Pupils are equal, round, and reactive to light.  Cardiovascular:     Rate and Rhythm: Normal rate.     Pulses: Normal pulses.     Comments: Blood pressure 148/87, pulse 92.  Nursing staff to recheck vital signs Pulmonary:     Effort: Pulmonary effort is normal.  Abdominal:     Palpations: Abdomen is soft.  Genitourinary:    Comments: Deferred Musculoskeletal:        General: Normal range of motion.     Cervical back: Normal range of motion.  Skin:    General: Skin is warm.   Neurological:     General: No focal deficit present.     Mental Status: He is alert and oriented to person, place, and time.  Psychiatric:        Mood and Affect: Mood normal.        Behavior: Behavior normal.    Review of Systems  Constitutional: Negative.  Negative for chills.  HENT: Negative.  Negative for ear pain, hearing loss and tinnitus.   Eyes: Negative.  Negative for blurred vision and double vision.  Respiratory: Negative.  Negative for cough, sputum production, shortness of breath and wheezing.   Cardiovascular: Negative.  Negative for chest pain, palpitations and orthopnea.       Blood pressure 148/87, pulse 92.  Nursing staff to recheck vital signs  Gastrointestinal: Negative.  Negative for heartburn, nausea and vomiting.  Genitourinary: Negative.  Negative for dysuria, frequency and urgency.  Musculoskeletal: Negative.  Negative for myalgias and neck pain.  Skin: Negative.  Negative for rash.  Neurological: Negative.  Negative for dizziness, tingling and headaches.  Endo/Heme/Allergies: Negative.  Negative for environmental allergies and polydipsia. Does not bruise/bleed easily.       Allergies   Amoxicillin Amoxicillin   Not Specified  10/22/2018 Childhood, possible rash  Deletion Reason:  Peanut (Diagnostic) Peanut (Diagnostic)  Other (See Comments) Not Specified  08/28/2021 Deletion Reason:  Penicillins Penicillins  Other (See Comments) Not Specified Allergy 02/11/2018 Unknown      Blood pressure (!) 148/87, pulse 92, temperature 98.1 F (36.7 C), temperature source Oral, resp. rate 20, height 6\' 2"  (1.88 m), weight 124.7 kg, SpO2 99 %. Body mass index is 35.31 kg/m.  Treatment Plan Summary: Daily contact with patient to assess and evaluate symptoms and  progress in treatment and Medication management  Disposition: Recommend psychiatric Inpatient admission when medically cleared.  This service was provided via telemedicine using Saunders 2-way, interactive audio and  video technology.  Names of all persons participating in this telemedicine service and their role in this encounter. Name: Mackay Hanauer Role: Patient  Name: Alan Mulder, NP Role: [facility]  Name: Dr. Renaye Rakers Role:Gregory [facility] ED physician  Name: Dr. Sherron Flemings Role: [facility] physician    Cecilie Lowers, FNP 06/09/2022 5:49 PM
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
## 2022-06-09 NOTE — ED Provider Notes (Signed)
Emergency Medicine Observation Re-evaluation Note  Gregory Saunders is a 26 y.o. male, seen on rounds today.  Pt initially presented to the ED for complaints of Psychiatric Evaluation Currently, the patient is sleeping soundly.  Physical Exam  BP (!) 153/91   Pulse 99   Temp 98.6 F (37 C) (Oral)   Resp 19   Ht 6\' 2"  (1.88 m)   Wt 124.7 kg   SpO2 95%   BMI 35.31 kg/m  Physical Exam General: Sleeping soundly Cardiac: Well-perfused Lungs: No increased work of breathing Psych: Resting comfortably, calm  ED Course / MDM  EKG:   I have reviewed the labs performed to date as well as medications administered while in observation.  Recent changes in the last 24 hours include patient presented yesterday after a behavioral outburst with his foster father.  Patient demonstrated insight into his schizophrenia and medical condition.  Foster mother and father do not feel comfortable with him returning to their house anymore.  Plan  Current plan is for behavioral health or TTS evaluation this morning.  Patient is is currently not under IVC.    , MD 06/09/22 [phone]

## 2022-06-09 NOTE — ED Notes (Signed)
Informed Gregory Mulder, NP, that pt is available for TTS and to inform me when she would like this RN to put TTS cart in room for pt evaluation

## 2022-06-09 NOTE — Consult Note (Signed)
  Attempted to assess patient x 3 via telepsych, however, no pick-up. Will try again later.  Alan Mulder, NP Psychchiatry

## 2022-06-10 NOTE — Progress Notes (Signed)
CSW spoke with Gregory Saunders, the Intake RN at [facility], who stated that the patient's primary dx is Dementia, which makes him ineligible for placement. The intake RN is suspecting that this maybe a typo and has requested a re-evaluation. CSW agreed to follow up with the RN and NP.    Damita Dunnings, MSW, LCSW-A  9:32 PM 06/10/2022

## 2022-06-10 NOTE — ED Provider Notes (Signed)
Emergency Medicine Observation Re-evaluation Note  Gregory Saunders is a 26 y.o. male, seen on rounds today.  Pt initially presented to the ED for complaints of Psychiatric Evaluation Currently, the patient is sleeping soundly.  Physical Exam  BP (!) 150/98 (BP Location: Right Arm)   Pulse (!) 102   Temp 98.6 F (37 C)   Resp 18   Ht 6\' 2"  (1.88 m)   Wt 124.7 kg   SpO2 100%   BMI 35.31 kg/m  Physical Exam General: Sleeping soundly Cardiac: Well-perfused Lungs: No increased work of breathing Psych: Resting comfortably, calm  ED Course / MDM  EKG:   I have reviewed the labs performed to date as well as medications administered while in observation.  Recent changes in the last 24 hours include patient w/ unremarkable labs, UDS +THC. no acute medical complaints. Patient is medically cleared.   Plan  Current plan is for TTS evaluation.  Patient is is currently not under IVC.    , MD    Loetta Rough, MD 06/10/22 [phone]

## 2022-06-10 NOTE — Progress Notes (Signed)
Patient has been denied by [facility] due to no appropriate beds available. Patient meets BH inpatient criteria per Alan Mulder, NP. Patient has been faxed out to the following facilities:   [facility]  [address] [place], [place] [address] [phone]  [facility]  [address] [place] [address] [phone]  [facility]  [address] [place], [place] [address] [phone]  [facility]  [address] [place] [address] [phone]  [facility]  [address], [place] [address] [phone]  [facility]  [address] [place] [address] [phone]  [facility]  [address], [place] [address] [phone]  [facility]  [address] [place] [address] [phone]  [facility]  [address], [place] [address] [phone]  [facility]  [address] [place] [address] [phone]  [facility]  [address], [place] [address] [phone]  [facility]  [address], [place] [address] [phone]  [facility]  [address] [place] [address] [phone]   Damita Dunnings, MSW, LCSW-A  9:03 PM 06/10/2022
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
## 2022-06-10 NOTE — ED Notes (Signed)
Pt reports pleasant, calm, and cooperative.

## 2022-06-10 NOTE — ED Notes (Addendum)
Pt's belongings, including phone and phone charger, placed in a locker.

## 2022-06-10 NOTE — Progress Notes (Addendum)
Inpatient Behavioral Health Placement  Meets inpatient criteria per Alan Mulder, NP. There are no available beds at [facility] per [facility] Rona Ravens, RN.  Referral was sent to the following facilities;   Destination Service Provider Address Phone Fax  [facility]  [address] [place], [place] [address] [phone]  [facility]  [address] [place] [address] [phone]  [facility]  [address] [place], [place] [address] [phone]  [facility]  [address] [place] [address] [phone]  [facility]  [address] [place] [address] [phone]  [facility]  [address], [place] [address] [phone]  [facility]  [address], [place] [address] [phone]  [facility]  [address], [place] [address] [phone]  [facility]  [address], [place] [address] [phone]  [facility]  [address], [place] [address] [phone]  [facility]  [address], [place] [address] [phone]  [facility]  [address], [place] [address] [phone]  [facility]  [address] [place] [address] [phone]-    Situation ongoing,  CSW will follow up.   Maryjean Ka, MSW, LCSWA 06/10/2022  @ 1:14 PM
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
## 2022-06-10 NOTE — ED Notes (Addendum)
Pt found by NT to still be in street clothes and all belongings at bedside.  Pt provided scrubs to change into.  Tele-sitter at bedside.

## 2022-06-11 DIAGNOSIS — F209 Schizophrenia, unspecified: Secondary | ICD-10-CM

## 2022-06-11 NOTE — ED Provider Notes (Signed)
Per NP Ntuen - Gregory Saunders is not psych cleared. He continues to need inpatient psychiatric admission placement. Clinical social worker continues to pursue appropriate inpatient placement option for patient.    Rondel Baton, MD 06/11/22 2028

## 2022-06-11 NOTE — ED Notes (Signed)
Pt given breakfast tray

## 2022-06-11 NOTE — ED Notes (Addendum)
Per FNP Alan Mulder "Gregory Saunders is not psych cleared.  He continues to need inpatient psychiatric admission placement.  Clinical social worker continues to pursue appropriate inpatient placement option for patient.  Please share with the treatment team and Gregory Saunders, ED physician". EDP Gregory Saunders was added to secure chat message. Gregory Everton Kalief Kattner,RN.

## 2022-06-11 NOTE — ED Notes (Signed)
Pt just finished speaking with TTS
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
## 2022-06-11 NOTE — Progress Notes (Signed)
Inpatient Behavioral Health  Pt meets inpatient criteria per Alan Mulder, FNP.  Referral was sent to the following facilities;  Destination Service Provider Address Phone Fax  [facility]  [address] [place], [place] [address] [phone]  [facility]  [address] [place] [address] [phone]  [facility]  [address] [place], [place] [address] [phone]  [facility]  [address] [place] [address] [phone]  [facility]  [address] [place] [address] [phone]  [facility]  [address], [place] [address] [phone]  [facility]  [address], [place] [address] [phone]  [facility]  [address] [place] [address] [phone]  [facility]  [address] [place] [address] [phone]  [facility]  [address], [place] [address] [phone]  [facility]  [address] [place] [address] [phone]  [facility]  [address], [place] [address] [phone]  [facility]  [address] [place] [address] [phone]     Situation ongoing,  CSW will follow up.   Maryjean Ka, MSW, [facility] 06/11/2022  @ 4:24 PM

## 2022-06-12 NOTE — ED Notes (Signed)
Pt calm and cooperative at this time. Pt ambulated to restroom w/o assistance.
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
## 2022-06-12 NOTE — ED Notes (Signed)
TTS at this time. 

## 2022-06-13 ENCOUNTER — Inpatient Hospital Stay (HOSPITAL_COMMUNITY)
Admission: AD | Admit: 2022-06-13 | Discharge: 2022-07-09 | DRG: 885 | Disposition: A | Discharge status: Home / Self Care | Payer: No Typology Code available for payment source | Source: Intra-hospital | Attending: Emergency Medicine | Admitting: Emergency Medicine

## 2022-06-13 DIAGNOSIS — Z91148 Patient's other noncompliance with medication regimen for other reason: Secondary | ICD-10-CM | POA: Diagnosis not present

## 2022-06-13 DIAGNOSIS — F17210 Nicotine dependence, cigarettes, uncomplicated: Secondary | ICD-10-CM | POA: Diagnosis present

## 2022-06-13 DIAGNOSIS — E7800 Pure hypercholesterolemia, unspecified: Secondary | ICD-10-CM | POA: Diagnosis present

## 2022-06-13 DIAGNOSIS — L209 Atopic dermatitis, unspecified: Secondary | ICD-10-CM | POA: Diagnosis present

## 2022-06-13 DIAGNOSIS — I10 Essential (primary) hypertension: Secondary | ICD-10-CM

## 2022-06-13 DIAGNOSIS — G4700 Insomnia, unspecified: Secondary | ICD-10-CM | POA: Diagnosis present

## 2022-06-13 DIAGNOSIS — F250 Schizoaffective disorder, bipolar type: Principal | ICD-10-CM | POA: Diagnosis present

## 2022-06-13 DIAGNOSIS — F17290 Nicotine dependence, other tobacco product, uncomplicated: Secondary | ICD-10-CM | POA: Diagnosis present

## 2022-06-13 DIAGNOSIS — Z79899 Other long term (current) drug therapy: Secondary | ICD-10-CM | POA: Diagnosis not present

## 2022-06-13 DIAGNOSIS — F1210 Cannabis abuse, uncomplicated: Secondary | ICD-10-CM | POA: Diagnosis present

## 2022-06-13 DIAGNOSIS — R7303 Prediabetes: Secondary | ICD-10-CM | POA: Diagnosis present

## 2022-06-13 DIAGNOSIS — E785 Hyperlipidemia, unspecified: Secondary | ICD-10-CM

## 2022-06-13 DIAGNOSIS — G4733 Obstructive sleep apnea (adult) (pediatric): Secondary | ICD-10-CM | POA: Diagnosis present

## 2022-06-13 DIAGNOSIS — F209 Schizophrenia, unspecified: Secondary | ICD-10-CM

## 2022-06-13 DIAGNOSIS — I4510 Unspecified right bundle-branch block: Secondary | ICD-10-CM | POA: Diagnosis present

## 2022-06-13 DIAGNOSIS — Z20822 Contact with and (suspected) exposure to covid-19: Secondary | ICD-10-CM | POA: Diagnosis present

## 2022-06-13 DIAGNOSIS — F1290 Cannabis use, unspecified, uncomplicated: Secondary | ICD-10-CM | POA: Diagnosis present

## 2022-06-13 DIAGNOSIS — F17200 Nicotine dependence, unspecified, uncomplicated: Secondary | ICD-10-CM | POA: Diagnosis present

## 2022-06-13 LAB — LIPID PANEL
Cholesterol: 186 mg/dL (ref 0–200)
HDL: 34 mg/dL — ABNORMAL LOW (ref >40)
LDL Cholesterol: 98 mg/dL (ref 0–99)
Total CHOL/HDL Ratio: 5.5 RATIO
Triglycerides: 272 mg/dL — ABNORMAL HIGH (ref <150)
VLDL: 54 mg/dL — ABNORMAL HIGH (ref 0–40)

## 2022-06-13 LAB — RESP PANEL BY RT-PCR (FLU A&B, COVID) ARPGX2
Influenza A by PCR: NEGATIVE
Influenza B by PCR: NEGATIVE
SARS Coronavirus 2 by RT PCR: NEGATIVE

## 2022-06-13 MED ORDER — ATORVASTATIN CALCIUM 10 MG PO TABS
20.0000 mg | ORAL_TABLET | Freq: Every day | ORAL | Status: DC
  Administered 2022-06-13: 20 mg via ORAL
  Filled 2022-06-13: qty 2

## 2022-06-13 NOTE — ED Notes (Signed)
Patient's IVC paperwork faxed to the following numbers [phone] and [phone].

## 2022-06-13 NOTE — ED Notes (Signed)
Pt sitting in room, watching TV (college football), as he has been all day. Expresses no needs. Pt is calm, appropriate, and cooperative. Sitter in place.

## 2022-06-13 NOTE — ED Notes (Signed)
Assumed pt care- pt resting in bed with telesitter in place, watching TV. Pt is pleasant and cooperative at this time

## 2022-06-13 NOTE — Progress Notes (Signed)
Inpatient Behavioral Health Placement  Meets inpatient criteria per Inda Merlin, NP.  Referral was sent to the following facilities;   Destination Service Provider Address Phone Fax  [facility]  [place], [place] [address] [place]  [facility]  [address] [place] [address] [phone]  [facility]  [place], [place] [address] [phone]  [facility]  [address] [place] [address] [place]  [facility]  [address] [place] [address] [phone]  [place] [address], [place] [address] [phone]  [facility]  [place] [address] [phone] [place]  [address], [place] [address] [phone] [facility]  [address] [place] [address] [phone] [place]  [address] [place] [address] [phone] [facility]  [address], [place] [address] [phone] [facility]  [address] [place] [address] [phone] [place], [place] [address] [place]  [place]  [address] [place] [address] [phone]    Situation ongoing,  CSW will follow up.   Benjaman Kindler, MSW, LCSWA 06/13/2022  @ 2:31 AM
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
## 2022-06-14 ENCOUNTER — Other Ambulatory Visit: Payer: Self-pay

## 2022-06-14 ENCOUNTER — Encounter (HOSPITAL_COMMUNITY): Payer: Self-pay | Admitting: Urology

## 2022-06-14 DIAGNOSIS — R7989 Other specified abnormal findings of blood chemistry: Secondary | ICD-10-CM | POA: Insufficient documentation

## 2022-06-14 DIAGNOSIS — F250 Schizoaffective disorder, bipolar type: Secondary | ICD-10-CM | POA: Diagnosis not present

## 2022-06-14 DIAGNOSIS — R7303 Prediabetes: Secondary | ICD-10-CM | POA: Diagnosis present

## 2022-06-14 DIAGNOSIS — E039 Hypothyroidism, unspecified: Secondary | ICD-10-CM | POA: Insufficient documentation

## 2022-06-14 DIAGNOSIS — F17200 Nicotine dependence, unspecified, uncomplicated: Secondary | ICD-10-CM

## 2022-06-14 DIAGNOSIS — F209 Schizophrenia, unspecified: Secondary | ICD-10-CM

## 2022-06-14 HISTORY — DX: Nicotine dependence, unspecified, uncomplicated: F17.200

## 2022-06-14 MED ORDER — ACETAMINOPHEN 325 MG PO TABS: 650.0000 mg | ORAL_TABLET | Freq: Four times a day (QID) | ORAL | Status: DC | PRN

## 2022-06-14 MED ORDER — ALUM & MAG HYDROXIDE-SIMETH 200-200-20 MG/5ML PO SUSP: 30.0000 mL | ORAL | Status: DC | PRN

## 2022-06-14 MED ORDER — NICOTINE 14 MG/24HR TD PT24
14.0000 mg | MEDICATED_PATCH | Freq: Every day | TRANSDERMAL | Status: DC
  Administered 2022-06-15 – 2022-06-16 (×2): 14 mg via TRANSDERMAL
  Filled 2022-06-14 (×7): qty 1

## 2022-06-14 MED ORDER — LORAZEPAM 1 MG PO TABS: 1.0000 mg | ORAL_TABLET | Freq: Four times a day (QID) | ORAL | Status: AC | PRN

## 2022-06-14 MED ORDER — ATORVASTATIN CALCIUM 20 MG PO TABS
20.0000 mg | ORAL_TABLET | Freq: Every day | ORAL | Status: DC
  Administered 2022-06-15 – 2022-07-08 (×24): 20 mg via ORAL
  Filled 2022-06-14: qty 2
  Filled 2022-06-14: qty 1
  Filled 2022-06-14 (×2): qty 2
  Filled 2022-06-14: qty 7
  Filled 2022-06-14 (×7): qty 2
  Filled 2022-06-14: qty 7
  Filled 2022-06-14 (×10): qty 2
  Filled 2022-06-14: qty 1
  Filled 2022-06-14 (×9): qty 2

## 2022-06-14 MED ORDER — VITAMIN B-1 100 MG PO TABS
100.0000 mg | ORAL_TABLET | Freq: Every day | ORAL | Status: DC
  Administered 2022-06-15 – 2022-07-09 (×25): 100 mg via ORAL
  Filled 2022-06-14 (×4): qty 1
  Filled 2022-06-14 (×2): qty 7
  Filled 2022-06-14 (×22): qty 1

## 2022-06-14 MED ORDER — HYDROXYZINE HCL 25 MG PO TABS
25.0000 mg | ORAL_TABLET | Freq: Four times a day (QID) | ORAL | Status: AC | PRN
  Administered 2022-06-14 – 2022-06-15 (×2): 25 mg via ORAL
  Filled 2022-06-14 (×2): qty 1

## 2022-06-14 MED ORDER — HYDROXYZINE HCL 25 MG PO TABS
25.0000 mg | ORAL_TABLET | Freq: Four times a day (QID) | ORAL | Status: DC | PRN
  Administered 2022-06-14: 25 mg via ORAL
  Filled 2022-06-14: qty 1

## 2022-06-14 MED ORDER — OLANZAPINE 10 MG PO TBDP: 10.0000 mg | ORAL_TABLET | Freq: Three times a day (TID) | ORAL | Status: DC | PRN

## 2022-06-14 MED ORDER — ADULT MULTIVITAMIN W/MINERALS CH
1.0000 | ORAL_TABLET | Freq: Every day | ORAL | Status: DC
  Administered 2022-06-15 – 2022-07-08 (×24): 1 via ORAL
  Filled 2022-06-14: qty 1
  Filled 2022-06-14: qty 7
  Filled 2022-06-14 (×4): qty 1
  Filled 2022-06-14: qty 7
  Filled 2022-06-14 (×20): qty 1

## 2022-06-14 MED ORDER — ONDANSETRON 4 MG PO TBDP: 4.0000 mg | ORAL_TABLET | Freq: Four times a day (QID) | ORAL | Status: AC | PRN

## 2022-06-14 MED ORDER — CLONIDINE HCL 0.1 MG PO TABS
0.1000 mg | ORAL_TABLET | Freq: Three times a day (TID) | ORAL | Status: DC | PRN
  Administered 2022-06-16: 0.1 mg via ORAL
  Filled 2022-06-14: qty 1

## 2022-06-14 MED ORDER — OLANZAPINE 10 MG PO TABS
20.0000 mg | ORAL_TABLET | Freq: Every day | ORAL | Status: DC
  Administered 2022-06-14 – 2022-06-18 (×5): 20 mg via ORAL
  Filled 2022-06-14 (×8): qty 2

## 2022-06-14 MED ORDER — ZIPRASIDONE MESYLATE 20 MG IM SOLR: 20.0000 mg | INTRAMUSCULAR | Status: DC | PRN

## 2022-06-14 MED ORDER — LOPERAMIDE HCL 2 MG PO CAPS: 2.0000 mg | ORAL_CAPSULE | ORAL | Status: AC | PRN

## 2022-06-14 MED ORDER — BENZTROPINE MESYLATE 1 MG PO TABS
1.0000 mg | ORAL_TABLET | Freq: Every day | ORAL | Status: DC
  Administered 2022-06-14 – 2022-06-15 (×2): 1 mg via ORAL
  Filled 2022-06-14 (×4): qty 1

## 2022-06-14 MED ORDER — MONTELUKAST SODIUM 10 MG PO TABS
10.0000 mg | ORAL_TABLET | Freq: Every day | ORAL | Status: DC
  Administered 2022-06-14 – 2022-07-08 (×25): 10 mg via ORAL
  Filled 2022-06-14 (×27): qty 1
  Filled 2022-06-14: qty 7
  Filled 2022-06-14: qty 1
  Filled 2022-06-14: qty 7

## 2022-06-14 MED ORDER — MAGNESIUM HYDROXIDE 400 MG/5ML PO SUSP: 30.0000 mL | Freq: Every day | ORAL | Status: DC | PRN

## 2022-06-14 MED ORDER — TRAZODONE HCL 50 MG PO TABS
50.0000 mg | ORAL_TABLET | Freq: Every evening | ORAL | Status: DC | PRN
  Administered 2022-06-14 – 2022-07-04 (×14): 50 mg via ORAL
  Filled 2022-06-14 (×14): qty 1

## 2022-06-14 MED ORDER — LORAZEPAM 1 MG PO TABS: 1.0000 mg | ORAL_TABLET | ORAL | Status: DC | PRN

## 2022-06-14 NOTE — [facility] Group Notes (Signed)
Adult Psychoeducational Group Note  Date:  06/14/2022 Time:  7:26 PM  Group Topic/Focus:  Goals Group:   The focus of this group is to help patients establish daily goals to achieve during treatment and discuss how the patient can incorporate goal setting into their daily lives to aide in recovery.  Participation Level:  Active  Participation Quality:  Attentive  Affect:  Appropriate  Cognitive:  Appropriate  Insight: Appropriate  Engagement in Group:  Engaged  Modes of Intervention:  Discussion  Additional Comments:    Patient attended goals group and was attentive the duration of it. Patient's goal was to get a discharge plan.  Ikesha Siller T Ria Comment 06/14/2022, 7:26 PM

## 2022-06-14 NOTE — Group Note (Signed)
LCSW Group Therapy Note  06/14/2022      Type of Therapy and Topic:  Group Therapy: Positive Affirmations   Description:   Group could not be held by social worker, but licensed RN did provide group.    CSW provided a handout for each patient, with the following information:   Positive Affirmation Worksheet  Programming your subconscious by repeating positive statements with focus, intention  and belief is a technique called positive affirmations. This Worksheet will walk you  through the process of creating your own positive affirmations.   Releasing Negative Feelings  It is believed that this process is more effective when incorporating and understanding the negative  feelings, or mental programs that you harbor within your subconscious regarding yourself. This first part  will help you identify your own negative beliefs. When you shine your conscious light on your negative  beliefs and understand that they are merely beliefs and not based on reality, you can then utilize your  positive affirmations to overcome such beliefs and focus the rudder of your own life. Write as many negative beliefs down as you feel apply to your feelings about yourself.  Use the following prompts as a guide: I never. Nobody else. I'm the only one that. I am not. I don't. I don't want. I hate. I can't. Identifying Wants  Now, considering each of the negative feelings that you wrote down about yourself, write  a list of what you really want or deserve being very specific.   Creating Affirmations  From the previous exercise, distill your wants to a list of your desires. Write down a list of  your positive affirmations. Use the following prompts as a guide: I am. I welcome. I deserve. I choose. I believe. I trust. I have. I know. I feel. I create. I LOVE. One of the most powerful affirmations starts with I am.  I am.  Vocalize Affirmations Daily  Now that you have your list of  affirmations repeat them out loud to yourself several times each  day. When you say them, focus on their meanings and visualize your life as though your  affirmations have already become reality.    Therapeutic Modalities:   Activity  Romaine Neville J Grossman-Orr, LCSW .  

## 2022-06-14 NOTE — Group Note (Deleted)
LCSW Group Therapy Note  Group Date: 06/14/2022 Start Time: 1000 End Time: 1001   Type of Therapy and Topic:  Group Therapy: Positive Affirmations  Participation Level:  {[facility] PARTICIPATION LEVEL:22264}   Description of Group:   This group addressed positive affirmation towards self and others.  Patients went around the room and identified two positive things about themselves and two positive things about a peer in the room.  Patients reflected on how it felt to share something positive with others, to identify positive things about themselves, and to hear positive things from others/ Patients were encouraged to have a daily reflection of positive characteristics or circumstances.   Therapeutic Goals: 1. Patients will verbalize two of their positive qualities 2. Patients will demonstrate empathy for others by stating two positive qualities about a peer in the group 3. Patients will verbalize their feelings when voicing positive self affirmations and when voicing positive affirmations of others 4. Patients will discuss the potential positive impact on their wellness/recovery of focusing on positive traits of self and others.  Summary of Patient Progress:  *** actively engaged in the discussion and . S*** was able***or not able to identify positive affirmations about ***self as well as other group members. Patient demonstrated *** insight into the subject matter, was respectful of peers, participated throughout the entire session.  Therapeutic Modalities:   Cognitive Behavioral Therapy Motivational Interviewing    Royalty Fakhouri J Grossman-Orr, LCSWA 06/14/2022  8:49 AM    

## 2022-06-14 NOTE — Progress Notes (Signed)
   06/14/22 0900  Psych Admission Type (Psych Patients Only)  Admission Status Involuntary  Psychosocial Assessment  Patient Complaints Anxiety;Suspiciousness  Eye Contact Staring  Facial Expression Animated  Affect Appropriate to circumstance  Speech Pressured;Tangential  Interaction Assertive  Motor Activity Slow  Appearance/Hygiene In scrubs  Behavior Characteristics Cooperative  Mood Preoccupied  Aggressive Behavior  Targets Family  Type of Behavior Verbal  Effect No apparent injury  Thought Process  Coherency Circumstantial  Content Preoccupation;Delusions  Delusions Paranoid  Perception Hallucinations  Hallucination Auditory;Visual  Judgment Impaired  Confusion Mild  Danger to Self  Current suicidal ideation? Denies  Danger to Others  Danger to Others None reported or observed
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
## 2022-06-14 NOTE — [facility] Group Notes (Signed)
Adult Psychoeducational Group Note Date:  06/14/2022 Time:  1100-1130 Group Topic/Focus: PROGRESSIVE RELAXATION. A group where deep breathing is taught and tensing and relaxation muscle groups is used. Imagery is used as well.  Pts are asked to imagine 3 pillars that hold them up when they are not able to hold themselves up and to share that with the group.  Participation Level:  Pt did not attend the group that was offered due to sleeping  Gregory Saunders A  
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
## 2022-06-14 NOTE — Tx Team (Signed)
Initial Treatment Plan 06/14/2022 3:36 AM Gregory Saunders GMW:[id number]    PATIENT STRESSORS: Marital or family conflict   Medication change or noncompliance   Occupational concerns   Substance abuse     PATIENT STRENGTHS: Active sense of humor  Physical Health  Work skills    PATIENT IDENTIFIED PROBLEMS: Depression  Anxiety  "Get treated"                 DISCHARGE CRITERIA:  Ability to meet basic life and health needs Adequate post-discharge living arrangements Improved stabilization in mood, thinking, and/or behavior Verbal commitment to aftercare and medication compliance  PRELIMINARY DISCHARGE PLAN: Attend aftercare/continuing care group Attend PHP/IOP Outpatient therapy Placement in alternative living arrangements  PATIENT/FAMILY INVOLVEMENT: This treatment plan has been presented to and reviewed with the patient, Gregory Saunders, and/or family member.  The patient and family have been given the opportunity to ask questions and make suggestions.  Wolfgang Phoenix, RN 06/14/2022, 3:36 AM

## 2022-06-14 NOTE — Progress Notes (Signed)
Adult Psychoeducational Group Note  Date:  06/14/2022 Time:  8:32 PM  Group Topic/Focus:  Wrap-Up Group:   The focus of this group is to help patients review their daily goal of treatment and discuss progress on daily workbooks.  Participation Level:  Active  Participation Quality:  Appropriate  Affect:  Appropriate  Cognitive:  Disorganized and Confused  Insight: Limited  Engagement in Group:  Limited  Modes of Intervention:  Discussion  Additional Comments:  Pt stated his goal for today was to focus on his treatment plan. Pt stated he accomplished his goal today. Pt stated he talked with his doctor and social worker about his care today. Pt rated his overall day a 10. Pt stated he really enjoyed interacting with his peers today.  Pt stated he was able to contact his cousin today which improved his overall day. Pt stated he felt better about himself today. Pt stated he was able to attend all meals. Pt stated he took all medications provided today. Pt stated he attend all groups held today. Pt stated his appetite was pretty good today. Pt rated sleep last night was pretty good. Pt stated the goal tonight was to get some rest. Pt stated he had no physical pain tonight. Pt admitted to dealing with visual hallucinations and auditory issues tonight. Pt nurse was updated on the situation.  Pt denies thoughts of harming himself or others. Pt stated he would alert staff if anything changed  [name] 06/14/2022, 8:32 PM
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
## 2022-06-15 ENCOUNTER — Encounter (HOSPITAL_COMMUNITY): Payer: Self-pay

## 2022-06-15 DIAGNOSIS — F250 Schizoaffective disorder, bipolar type: Secondary | ICD-10-CM | POA: Diagnosis not present

## 2022-06-15 MED ORDER — DIPHENHYDRAMINE HCL 50 MG/ML IJ SOLN: 50.0000 mg | Freq: Three times a day (TID) | INTRAMUSCULAR | Status: DC | PRN

## 2022-06-15 MED ORDER — LORAZEPAM 2 MG/ML IJ SOLN: 2.0000 mg | Freq: Four times a day (QID) | INTRAMUSCULAR | Status: DC | PRN

## 2022-06-15 MED ORDER — DOCUSATE SODIUM 100 MG PO CAPS: 100.0000 mg | ORAL_CAPSULE | Freq: Every day | ORAL | Status: DC | PRN

## 2022-06-15 MED ORDER — OLANZAPINE 10 MG IM SOLR: 5.0000 mg | Freq: Two times a day (BID) | INTRAMUSCULAR | Status: DC | PRN

## 2022-06-15 MED ORDER — HALOPERIDOL LACTATE 5 MG/ML IJ SOLN: 5.0000 mg | Freq: Four times a day (QID) | INTRAMUSCULAR | Status: DC | PRN

## 2022-06-15 MED ORDER — OLANZAPINE 5 MG PO TBDP: 5.0000 mg | ORAL_TABLET | Freq: Two times a day (BID) | ORAL | Status: DC | PRN

## 2022-06-15 NOTE — Plan of Care (Signed)
  Problem: Education: Goal: Knowledge of [facility] Education information/materials will improve Outcome: Progressing   Problem: Education: Goal: Emotional status will improve Outcome: Progressing   Problem: Education: Goal: Mental status will improve Outcome: Progressing  Patient compliant with treatment visible in Milieu interacting well with Peers and Staff continue to endorse AVH and denies SI/HI and verbally contracted for safety. Support and encouragement provided,
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
## 2022-06-15 NOTE — Plan of Care (Signed)
  Problem: Coping: Goal: Ability to verbalize frustrations and anger appropriately will improve Outcome: Progressing   Problem: Coping: Goal: Ability to demonstrate self-control will improve Outcome: Progressing   Problem: Safety: Goal: Periods of time without injury will increase Outcome: Progressing   

## 2022-06-15 NOTE — Progress Notes (Signed)
   06/15/22 0837  Psych Admission Type (Psych Patients Only)  Admission Status Involuntary  Psychosocial Assessment  Patient Complaints Suspiciousness  Eye Contact Staring  Facial Expression Flat  Affect Appropriate to circumstance  Speech Soft  Interaction Minimal  Motor Activity Other (Comment) (WNL)  Appearance/Hygiene Unremarkable  Behavior Characteristics Appropriate to situation;Cooperative  Mood Preoccupied  Thought Process  Coherency Circumstantial  Content Preoccupation;Delusions  Delusions Paranoid  Perception Hallucinations  Hallucination Auditory;Visual ("I hear and see the spirit world")  Judgment Impaired  Confusion None  Danger to Self  Current suicidal ideation? Denies  Danger to Others  Danger to Others None reported or observed

## 2022-06-15 NOTE — Progress Notes (Signed)
Recreation Therapy Notes  INPATIENT RECREATION THERAPY ASSESSMENT  Patient Details Name: Gregory Saunders MRN: [id number] DOB: Feb 01, 1996 Today's Date: 06/15/2022       Information Obtained From: Patient  Able to Participate in Assessment/Interview: Yes  Patient Presentation: Alert Gregory Saunders)  Reason for Admission (Per Patient): Other (Comments) (Smashing the phone, arguing)  Patient Stressors: Other (Comment) ("I don't like being here (hospital)")  Coping Skills:   Isolation, Sports, TV, Exercise, Music, Meditate, Deep Breathing, Prayer, Avoidance, Read, Hot Bath/Shower  Leisure Interests (2+):  Exercise - Walking, Individual - Other (Comment) (Relax)  Frequency of Recreation/Participation: Other (Comment) (Daily)  Awareness of Community Resources:  Yes  Community Resources:  Park, Art therapist, Engineer, drilling  Current Use: No  If no, Barriers?: Transportation  Expressed Interest in [place]: No  [employer] of Residence:  Hydrologist  Patient Main Form of Transportation: Musician  Patient Strengths:  Listen  Patient Identified Areas of Improvement:  "none"  Patient Goal for Hospitalization:  "be compliant and listen"  Current SI (including self-harm):  No  Current HI:  No  Current AVH: Yes (hearing the spirit world)  Staff Intervention Plan: Group Attendance, Collaborate with Interdisciplinary Treatment Team  Consent to Intern Participation: N/A   Gregory Saunders, Gregory Saunders, Gregory Saunders A 06/15/2022, 1:27 PM

## 2022-06-15 NOTE — Progress Notes (Signed)
Adult Psychoeducational Group Note  Date:  06/15/2022 Time:  8:44 PM  Group Topic/Focus:  Wrap-Up Group:   The focus of this group is to help patients review their daily goal of treatment and discuss progress on daily workbooks.  Participation Level:  Active  Participation Quality:  Appropriate  Affect:  Appropriate  Cognitive:  Appropriate  Insight: Appropriate  Engagement in Group:  Engaged  Modes of Intervention:  Discussion  Additional Comments:    Pt attended and participated in the [place] group. Pt day.mood 10/10. "My day been okay no problems". Pt denies pain, SI/HI and AVH. Pt has made some progress toward his goal of getting acclimated with treatment and staying compliant with treatment. Pt reports attending the scheduled groups, meals and following program rules. Pt meditates and prays throughout the day to improve coping.  Gregory Saunders 06/15/2022, 8:44 PM
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
## 2022-06-15 NOTE — Group Note (Signed)
LCSW Group Therapy Note  06/15/2022    1:00 - 2:00 PM               Type of Therapy and Topic:  Group Therapy: Understanding the differences between fear and anxiety / Fear Ladder & Examining the Evidence  Participation Level:  Active   Description of Group:   In this group session, patients learned how to define and recognize the similarities differences between fear and anxiety. Patients will explore reactions we have to fear and anxiety. Patients identified a fear or something they feel anxious about. Patients analyzed scenarios and discussed both the positive and negative aspects to them. Patients were asked to identify if it is a fear or anxiety as well.  Patients were asked to provide their own examples. Patients will learn what to do for both fear and anxiety. CSW provided tools such as breaking things up into smaller steps, challenging automatic negative thinking / questions to ask, fear ladder and how to use gradual exposure. Patients will be asked to practice each tool with situations and to complete a fear ladder for their personal gain.   Therapeutic Goals: Patients will learn the difference between fear versus anxiety and the definitions of both. Patients will utilize scenarios and be able to identify if this is an example of a fear or anxiety. Patients will learn tools to handle both their fears and anxieties as well as discuss breaking it into smaller steps and exposure.  Patients will be asked to provide examples of their own and discuss how things have both positive and negative aspects.  Patients were provided questions to ask to challenge automatic negative thinking for anxiety.  Patients were provided a sample form of a fear ladder and asked to complete one for a fear.   Summary of Patient Progress:  Patient was engaged and participated in the group session. Patient shared their understanding of fear and anxiety   . Patient shared one fear and one anxiety. Patient reports  their plan to help with fear/anxiety to be.    Therapeutic Modalities:   Cognitive Behavioral Therapy Motivational Interviewing  Brief Therapy  Zachery Conch, LCSW  06/15/2022 2:44 PM

## 2022-06-15 NOTE — Group Note (Signed)
Recreation Therapy Group Note   Group Topic:Coping Skills  Group Date: 06/15/2022 Start Time: 1000 End Time: 1040 Facilitators: [name], LRT,CTRS Location: [address] Dayroom   Goal Area(s) Addresses:  Patient will identify positive coping skills techniques. Patient will identify benefits of using coping skills post d/c.  Group Description:  Mind Map.  Patients was provided a blank template of a diagram with 32 blank boxes in a tiered system, branching from the center (similar to a bubble chart). LRT directed patients to label the middle of the diagram "Coping Skills".  LRT and patients filled in the second tier with sources that coping skills could be used for (depression, anger, family, anxiety, death, divorce, sickness and finances).  Patients were to then come up with 3 effective coping skills to address each identified area in the remaining boxes stemming from a particular source. Pts were encouraged to share ideas with the group as LRT wrote the responses on the board.   Affect/Mood: N/A   Participation Level: Did not attend    Clinical Observations/Individualized Feedback:     Plan: Continue to engage patient in RT group sessions 2-3x/week.   [name], Glennis Brink 06/15/2022 12:51 PM

## 2022-06-15 NOTE — BH IP Treatment Plan (Signed)
Interdisciplinary Treatment and Diagnostic Plan Update  06/15/2022 Time of Session: 10:10am Gregory Saunders MRN: [id number]  Principal Diagnosis: Schizoaffective disorder, bipolar type ([place])  Secondary Diagnoses: Principal Problem:   Schizoaffective disorder, bipolar type ([place]) Active Problems:   Cannabis use disorder   OSA (obstructive sleep apnea)   Prediabetes   Tobacco use disorder   Current Medications:  Current Facility-Administered Medications  Medication Dose Route Frequency Provider Last Rate Last Admin   acetaminophen (TYLENOL) tablet 650 mg  650 mg Oral Q6H PRN Ajibola, Ene A, NP       alum & mag hydroxide-simeth (MAALOX/MYLANTA) 200-200-20 MG/5ML suspension 30 mL  30 mL Oral Q4H PRN Ajibola, Ene A, NP       atorvastatin (LIPITOR) tablet 20 mg  20 mg Oral QHS Merrily Brittle, DO       benztropine (COGENTIN) tablet 1 mg  1 mg Oral QHS Merrily Brittle, DO   1 mg at 06/14/22 2049   cloNIDine (CATAPRES) tablet 0.1 mg  0.1 mg Oral Q8H PRN Harlow Asa, MD       hydrOXYzine (ATARAX) tablet 25 mg  25 mg Oral Q6H PRN Harlow Asa, MD   25 mg at 06/14/22 2113   loperamide (IMODIUM) capsule 2-4 mg  2-4 mg Oral PRN Harlow Asa, MD       OLANZapine zydis (ZYPREXA) disintegrating tablet 10 mg  10 mg Oral Q8H PRN Merrily Brittle, DO       And   LORazepam (ATIVAN) tablet 1 mg  1 mg Oral PRN Merrily Brittle, DO       And   ziprasidone (GEODON) injection 20 mg  20 mg Intramuscular PRN Merrily Brittle, DO       LORazepam (ATIVAN) tablet 1 mg  1 mg Oral Q6H PRN Nelda Marseille, Amy E, MD       magnesium hydroxide (MILK OF MAGNESIA) suspension 30 mL  30 mL Oral Daily PRN Ajibola, Ene A, NP       montelukast (SINGULAIR) tablet 10 mg  10 mg Oral QHS Merrily Brittle, DO   10 mg at 06/14/22 2050   multivitamin with minerals tablet 1 tablet  1 tablet Oral Daily Harlow Asa, MD   1 tablet at 06/15/22 0824   nicotine (NICODERM CQ - dosed in mg/24 hours) patch 14 mg  14 mg Transdermal Daily  Ajibola, Ene A, NP   14 mg at 06/15/22 0824   OLANZapine (ZYPREXA) tablet 20 mg  20 mg Oral QHS Merrily Brittle, DO   20 mg at 06/14/22 2050   ondansetron (ZOFRAN-ODT) disintegrating tablet 4 mg  4 mg Oral Q6H PRN Harlow Asa, MD       thiamine (Vitamin B-1) tablet 100 mg  100 mg Oral Daily Nelda Marseille, Amy E, MD   100 mg at 06/15/22 0824   traZODone (DESYREL) tablet 50 mg  50 mg Oral QHS PRN Harlow Asa, MD   50 mg at 06/14/22 2049   PTA Medications: Medications Prior to Admission  Medication Sig Dispense Refill Last Dose   OLANZapine (ZYPREXA) 10 MG tablet Take 1 tablet (10 mg total) by mouth at bedtime. 30 tablet 2 Past Month   risperiDONE (RISPERDAL) 3 MG tablet Take 3 mg by mouth 2 (two) times daily.   Past Month    Patient Stressors: Marital or family conflict   Medication change or noncompliance   Occupational concerns   Substance abuse    Patient Strengths: Active sense of humor  Physical Health  Work Psychologist, occupational   Treatment Modalities: Medication Management, Group therapy, Case management,  1 to 1 session with clinician, Psychoeducation, Recreational therapy.   Physician Treatment Plan for Primary Diagnosis: Schizoaffective disorder, bipolar type ([place]) Long Term Goal(s):     Short Term Goals:    Medication Management: Evaluate patient's response, side effects, and tolerance of medication regimen.  Therapeutic Interventions: 1 to 1 sessions, Unit Group sessions and Medication administration.  Evaluation of Outcomes: Not Met  Physician Treatment Plan for Secondary Diagnosis: Principal Problem:   Schizoaffective disorder, bipolar type ([place]) Active Problems:   Cannabis use disorder   OSA (obstructive sleep apnea)   Prediabetes   Tobacco use disorder  Long Term Goal(s):     Short Term Goals:       Medication Management: Evaluate patient's response, side effects, and tolerance of medication regimen.  Therapeutic Interventions: 1 to 1 sessions, Unit Group  sessions and Medication administration.  Evaluation of Outcomes: Not Met   RN Treatment Plan for Primary Diagnosis: Schizoaffective disorder, bipolar type ([place]) Long Term Goal(s): Knowledge of disease and therapeutic regimen to maintain health will improve  Short Term Goals: Ability to remain free from injury will improve, Ability to verbalize frustration and anger appropriately will improve, Ability to demonstrate self-control, Ability to participate in decision making will improve, Ability to verbalize feelings will improve, Ability to disclose and discuss suicidal ideas, Ability to identify and develop effective coping behaviors will improve, and Compliance with prescribed medications will improve  Medication Management: RN will administer medications as ordered by provider, will assess and evaluate patient's response and provide education to patient for prescribed medication. RN will report any adverse and/or side effects to prescribing provider.  Therapeutic Interventions: 1 on 1 counseling sessions, Psychoeducation, Medication administration, Evaluate responses to treatment, Monitor vital signs and CBGs as ordered, Perform/monitor CIWA, COWS, AIMS and Fall Risk screenings as ordered, Perform wound care treatments as ordered.  Evaluation of Outcomes: Not Met   LCSW Treatment Plan for Primary Diagnosis: Schizoaffective disorder, bipolar type ([place]) Long Term Goal(s): Safe transition to appropriate next level of care at discharge, Engage patient in therapeutic group addressing interpersonal concerns.  Short Term Goals: Engage patient in aftercare planning with referrals and resources, Increase social support, Increase ability to appropriately verbalize feelings, Increase emotional regulation, Facilitate acceptance of mental health diagnosis and concerns, Facilitate patient progression through stages of change regarding substance use diagnoses and concerns, Identify triggers associated with  mental health/substance abuse issues, and Increase skills for wellness and recovery  Therapeutic Interventions: Assess for all discharge needs, 1 to 1 time with Social worker, Explore available resources and support systems, Assess for adequacy in community support network, Educate family and significant other(s) on suicide prevention, Complete Psychosocial Assessment, Interpersonal group therapy.  Evaluation of Outcomes: Not Met   Progress in Treatment: Attending groups: Yes. Participating in groups: Yes. Taking medication as prescribed: Yes. Toleration medication: Yes. Family/Significant other contact made: No, will contact:  Junious Ragone [phone]  Patient understands diagnosis: No. Discussing patient identified problems/goals with staff: No. Medical problems stabilized or resolved: Yes. Denies suicidal/homicidal ideation: Yes. Issues/concerns per patient self-inventory: No.  New problem(s) identified: No, Describe:  none reported   New Short Term/Long Term Goal(s):   medication stabilization, elimination of SI thoughts, development of comprehensive mental wellness plan.    Patient Goals:  Pt states, "I want to refocus and relax"  Discharge Plan or Barriers: Patient recently admitted. CSW will continue to follow and assess for appropriate referrals and  possible discharge planning.    Reason for Continuation of Hospitalization: Anxiety Delusions  Depression Hallucinations Medication stabilization  Estimated Length of Stay: 3-7 days  Last 3 [place] Suicide Severity Risk Score: Flowsheet Row Admission (Current) from 06/13/2022 in [place] [facility] ED from 06/08/2022 in [place] ED from 06/21/2021 in [place] No Risk No Risk No Risk       Last PHQ 2/9 Scores:     No data to display          Scribe for Treatment Team: Zachery Conch, LCSW 06/15/2022 11:05  AM
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
## 2022-06-15 NOTE — Progress Notes (Signed)
   06/15/22 2200  Psych Admission Type (Psych Patients Only)  Admission Status Involuntary  Psychosocial Assessment  Patient Complaints Depression;Suspiciousness  Eye Contact Staring  Facial Expression Animated  Affect Appropriate to circumstance  Speech Pressured;Tangential  Interaction Assertive  Motor Activity Slow  Appearance/Hygiene In scrubs  Behavior Characteristics Appropriate to situation;Cooperative  Mood Preoccupied  Aggressive Behavior  Targets Family  Type of Behavior Verbal  Effect No apparent injury  Thought Process  Coherency Circumstantial  Content Preoccupation;Delusions  Delusions Paranoid  Perception Hallucinations  Hallucination Visual;Auditory  Judgment Impaired  Confusion None  Danger to Self  Current suicidal ideation? Denies (Denies)  Danger to Others  Danger to Others None reported or observed   Patient compliant with medication endorses anxiety and A/VH denies SI/HI and verbally contracted for safety.   Support and encouragement provided.

## 2022-06-16 DIAGNOSIS — F250 Schizoaffective disorder, bipolar type: Secondary | ICD-10-CM | POA: Diagnosis not present

## 2022-06-16 MED ORDER — AMLODIPINE BESYLATE 5 MG PO TABS
5.0000 mg | ORAL_TABLET | Freq: Every day | ORAL | Status: DC
  Administered 2022-06-16 – 2022-06-19 (×4): 5 mg via ORAL
  Filled 2022-06-16 (×6): qty 1
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
## 2022-06-16 NOTE — Progress Notes (Signed)
Adult Psychoeducational Group Note  Date:  06/16/2022 Time:  11:21 PM  Group Topic/Focus:  Wrap-Up Group:   The focus of this group is to help patients review their daily goal of treatment and discuss progress on daily workbooks.  Participation Level:  Did Not Attend  Participation Quality:   Did Not Attend  Affect:   Did Not Attend  Cognitive:   Did Not Attend  Insight: None  Engagement in Group:   Did Not Attend  Modes of Intervention:   Did Not Attend  Additional Comments:  Pt was encouraged to attend wrap up group but did not attend.  [name] 06/16/2022, 11:21 PM

## 2022-06-16 NOTE — BHH Counselor (Addendum)
B[facility]/BMU LCSW Progress Note   06/16/2022    2:15 PM  Gregory Saunders   [id number]   Type of Contact and Topic:  Payee   Patient expressed that his mother is his payee.  Provider requested for CSW to confirm and to see if for discharge if patient can access funds for hotel due to not being able to return home.  CSW reached out to mother with patient permission.  No answer. CSW is awaiting call back to determine disposition options.   Addendum: Patient mother called back and confirmed that she is the payee for patient.  Mother reports that patient only has 3 weeks of money left for a hotel and would not be a sustainable solution.  Mother reports that she is currently working on getting him a boarding house as it would be more sustainable.     Signed:  Riki Altes,  MSW, LCSW, LCAS 06/16/2022 2:15 PM

## 2022-06-16 NOTE — Progress Notes (Signed)
   06/16/22 2200  Psych Admission Type (Psych Patients Only)  Admission Status Involuntary  Psychosocial Assessment  Patient Complaints Anxiety;Suspiciousness  Eye Contact Staring  Facial Expression Anxious  Affect Appropriate to circumstance  Speech Logical/coherent  Interaction Assertive  Motor Activity Slow  Appearance/Hygiene In scrubs  Behavior Characteristics Appropriate to situation  Mood Preoccupied  Aggressive Behavior  Effect No apparent injury  Thought Process  Coherency Circumstantial  Content Preoccupation  Delusions Paranoid  Perception Hallucinations  Hallucination Auditory;Visual  Judgment Impaired  Confusion None  Danger to Self  Current suicidal ideation? Denies  Danger to Others  Danger to Others None reported or observed

## 2022-06-16 NOTE — Progress Notes (Signed)
Orthostatic vitals done later this evening and recorded in flow sheet as pt was irritable with multiple BP checks earlier this shift "It's been like this since I was young. It's not coming down after y'all checks it multiple times. BP remains elevated at 162/96 and he's asymptomatic. PRN Clonidine 0.1 mg given as ordered at 1833. Will notify oncoming shift. Pt remains medications compliant with mouth checks. Support, encouragement and reassurance offered to pt. He tolerates all meals well and fluids well. Safety maintained.

## 2022-06-16 NOTE — Group Note (Signed)
Recreation Therapy Group Note   Group Topic:Problem Solving  Group Date: 06/16/2022 Start Time: 1000 End Time: 1040 Facilitators: [name], LRT,CTRS Location: [address]   Goal Area(s) Addresses:  Patient will effectively work in a team with other group members. Patient will verbalize importance of using appropriate problem solving techniques.  Patient will identify positive change associated with effective problem solving skills.   Group Description:  Brain Teasers. LRT and patients discussed the importance of problem solving and critical thinking.  Patients discussed how problem solving plays a part in everyday life.  Patients were then given two worksheets of brain teasers.  Patients were to work together to come up with the solution for each problem.  LRT and group went over the answers at completion of the activity.   Affect/Mood: N/A   Participation Level: Did not attend    Clinical Observations/Individualized Feedback:     Plan: Continue to engage patient in RT group sessions 2-3x/week.   [name], LRT,CTRS 06/16/2022 10:58 AM

## 2022-06-17 DIAGNOSIS — F250 Schizoaffective disorder, bipolar type: Secondary | ICD-10-CM | POA: Diagnosis not present

## 2022-06-17 NOTE — Group Note (Signed)
Recreation Therapy Group Note   Group Topic:Self-Esteem  Group Date: 06/17/2022 Start Time: 1005 End Time: 1100 Facilitators: [name], LRT,CTRS Location: [address]   Goal Area(s) Addresses:  Patient will successfully identify positive attributes about themselves.  Patient will identify healthy ways to increase self-esteem. Patient will acknowledge benefit(s) of improved self-esteem.    Group Description: Haematologist.  Patients were asked to define self esteem and what it means to them.  Patients were also asked to identify an instance when their self esteem was tested.  Patients were then given a print out of a mirror.  Patients were to envision looking at themselves in the mirror.  Patients were to then write or draw the positive traits they see in themselves in the mirror.  Patients were given markers to complete the assignment.  LRT also took requests from patients to play positive, uplifting music while they worked.   Affect/Mood: N/A   Participation Level: Did not attend    Clinical Observations/Individualized Feedback:      Plan: Continue to engage patient in RT group sessions 2-3x/week.   [name], LRT,CTRS 06/17/2022 11:17 AM
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
## 2022-06-17 NOTE — BHH Group Notes (Signed)
Adult Psychoeducational Group Note  Date:  06/17/2022 Time:  9:56 AM  Group Topic/Focus:  Goals Group:   The focus of this group is to help patients establish daily goals to achieve during treatment and discuss how the patient can incorporate goal setting into their daily lives to aide in recovery.  Participation Level:  Did Not Attend  Dub Mikes 06/17/2022, 9:56 AM

## 2022-06-17 NOTE — Plan of Care (Signed)
  Problem: Education: Goal: Mental status will improve Outcome: Progressing   

## 2022-06-17 NOTE — Group Note (Signed)
[facility] LCSW Group Therapy Note   Group Date: 06/17/2022 Start Time: 1330 End Time: 1400   Type of Therapy and Topic: Group Therapy: Avoiding Self-Sabotaging and Enabling Behaviors  Participation Level: Did Not Attend  Description of Group:  In this group, patients will learn how to identify obstacles, self-sabotaging and enabling behaviors, as well as: what are they, why do we do them and what needs these behaviors meet. Discuss unhealthy relationships and how to have positive healthy boundaries with those that sabotage and enable. Explore aspects of self-sabotage and enabling in yourself and how to limit these self-destructive behaviors in everyday life.   Therapeutic Goals: 1. Patient will identify one obstacle that relates to self-sabotage and enabling behaviors 2. Patient will identify one personal self-sabotaging or enabling behavior they did prior to admission 3. Patient will state a plan to change the above identified behavior 4. Patient will demonstrate ability to communicate their needs through discussion and/or role play.    Summary of Patient Progress: Patient did not attend group despite encouraged participation.    Therapeutic Modalities:  Cognitive Behavioral Therapy Person-Centered Therapy Motivational Interviewing    Durenda Hurt, [place]

## 2022-06-17 NOTE — Progress Notes (Signed)
D- Patient alert and oriented. Patient affect/mood is isolative to his room. Denies SI, HI, AVH, and pain. Patient stated that he is a vampire and opened his mouth to show me his fangs.  A- Scheduled medications administered to patient, per MD orders. Support and encouragement provided.  Routine safety checks conducted every 15 minutes.  Patient informed to notify staff with problems or concerns. R- No adverse drug reactions noted. Patient contracts for safety at this time. Patient compliant with medications and treatment plan. Patient receptive, calm, and cooperative. Patient interacts well with others on the unit.  Patient remains safe at this time.

## 2022-06-18 DIAGNOSIS — F250 Schizoaffective disorder, bipolar type: Secondary | ICD-10-CM | POA: Diagnosis not present

## 2022-06-18 NOTE — Progress Notes (Signed)
   06/18/22 2005  Psych Admission Type (Psych Patients Only)  Admission Status Involuntary  Psychosocial Assessment  Patient Complaints None  Eye Contact Fair  Facial Expression Flat  Affect Appropriate to circumstance  Speech Logical/coherent  Interaction Avoidant  Motor Activity Other (Comment) (wnl)  Appearance/Hygiene Unremarkable  Behavior Characteristics Cooperative  Mood Suspicious;Pleasant  Thought Process  Coherency Circumstantial  Content Preoccupation  Delusions None reported or observed  Perception Hallucinations  Hallucination Visual (seeing spirits; says he has seen them since he was young)  Judgment Impaired  Confusion None  Danger to Self  Current suicidal ideation? Denies  Danger to Others  Danger to Others None reported or observed   Progress note   D: Pt seen in his room. Pt denies SI, HI, AH. Pt endorses VH as seeing spirits. "I've been seeing them since I was little." Pt seem guarded.  Pt rates pain  0/10. Pt rates anxiety  0/10 and depression  0/10. Pt refuses to go to groups. "I went the first day but they are uninteresting to me so I don't go." Pt denies any other issues at this time.  A: Pt provided support and encouragement. Pt given scheduled medication as prescribed. PRNs as appropriate. Pt took medication without issue. Mouth checked for cheeking. Q15 min checks for safety.   R: Pt safe on the unit. Will continue to monitor.

## 2022-06-18 NOTE — BHH Counselor (Signed)
BHH/BMU LCSW Progress Note   06/18/2022    11:16 AM  Gregory Saunders   [id number]   Type of Contact and Topic:  housing   CSW met with patient and provided shelter and homeless resources.  Patient discussed preference for boarding house and understands that his mother is looking for a boarding house with him.     Signed:  Riki Altes, MSW, LCSW, LCAS 06/18/2022 11:16 AM

## 2022-06-18 NOTE — Progress Notes (Signed)
Pt presents with blunted affect, irritable mood, agitated and argumentative this shift on interactions. Refused mouth checks "Why do I have to show you that I take my medicine for, I'm not a kid, I'm not doing that. That's stupid".  Observed to be guarded, isolative to room this shift. BP remains elevated but slightly improved this shift. Support, encouragement and reassurance provided to pt. Safety checks maintained at Q 15 minutes intervals with verbal outburst X 1. Pt tolerates all PO intake well.

## 2022-06-18 NOTE — Progress Notes (Signed)
Psychoeducational Group Note  Date:  06/18/2022 Time:  2020  Group Topic/Focus:  Wrap-Up Group:   The focus of this group is to help patients review their daily goal of treatment and discuss progress on daily workbooks.  Participation Level: Did Not Attend  Participation Quality:  Not Applicable  Affect:  Not Applicable  Cognitive:  Not Applicable  Insight:  Not Applicable  Engagement in Group: Not Applicable  Additional Comments:  The patient did not attend group this evening.   Archie Balboa S 06/18/2022, 8:20 PM

## 2022-06-18 NOTE — Progress Notes (Signed)

## 2022-06-18 NOTE — Progress Notes (Signed)
[facility] MD Progress Note  06/18/2022 7:20 AM Gregory Saunders  MRN:  [id number] Principal Problem: Schizoaffective disorder, bipolar type (HCC) Diagnosis: Principal Problem:   Schizoaffective disorder, bipolar type (HCC) Active Problems:   Cannabis use disorder   OSA (obstructive sleep apnea)   Prediabetes   Tobacco use disorder   Reason for Admission: Worsening psychosis   Subjective:  Patient was seen and assessed at bedside.  Patient continues to ruminate about going to court.  Patient states that he will be going to prison because he missed court on Wednesday despite multiple conversations that this court date has not been set in place.  Did discuss that he may go to prison for assault on his dad but patient continues to ruminate about missing court date.  Patient has made no attempts to confirm nor deny the court date insisting that "I have papers telling me I had court and I am going to jail because of missing court for the assault".  Patient appears much more irritable but is not physically aggressive nor verbally aggressive.  Patient is redirectable despite ruminations.  But denies any acute complaints.  Patient denies SI/HI today.  He continues to report AH and VH related to the spirit world.  Reports no somatic complaints from medications.  Denies mood or anxiety symptoms. Continues to have fixed delusion of being born as a vampire and is also a voodoo doll.  Patient continues to be amenable to discharging to homeless shelter at time of discharge but mother is also attempting to get patient a boarding house.   Discussed that we would need to get lab draws prior to discharge patient seemed to understand this but unclear whether patient will continue to refuse it.  Objective:  Chart Review Past 24 hours of patient's chart was reviewed.  Patient is compliant with scheduled meds. Required Agitation PRNs: none Per RN notes, no documented behavioral issues and is  attending  group. Patient slept, 7 hours  Total Time Spent in Direct Patient Care:  I personally spent 30 minutes on the unit in direct patient care. The direct patient care time included face-to-face time with the patient, reviewing the patient's chart, communicating with other professionals, and coordinating care. Greater than 50% of this time was spent in counseling or coordinating care with the patient regarding goals of hospitalization, psycho-education, and discharge planning needs.   Past Psychiatric History: see H&P  Past Medical History:  Past Medical History:  Diagnosis Date   Cannabis use disorder 10/16/2020   Schizophrenia (HCC)    Strain of right Achilles tendon 04/07/2019   Tobacco use disorder 06/14/2022    Past Surgical History:  Procedure Laterality Date   stye removal     WISDOM TOOTH EXTRACTION     Family History: History reviewed. No pertinent family history. Family Psychiatric  History: see H&P Social History:  Social History   Substance and Sexual Activity  Alcohol Use Never     Social History   Substance and Sexual Activity  Drug Use Not Currently   Types: Marijuana    Social History   Socioeconomic History   Marital status: Single    Spouse name: Not on file   Number of children: Not on file   Years of education: Not on file   Highest education level: Not on file  Occupational History   Not on file  Tobacco Use   Smoking status: Every Day    Packs/day: 0.50    Types: Cigarettes   Smokeless tobacco:  Never  Vaping Use   Vaping Use: Every day   Substances: CBD  Substance and Sexual Activity   Alcohol use: Never   Drug use: Not Currently    Types: Marijuana   Sexual activity: Not on file  Other Topics Concern   Not on file  Social History Narrative   Not on file   Social Determinants of Health   Financial Resource Strain: Not on file  Food Insecurity: Unknown (06/14/2022)   Hunger Vital Sign    Worried About Running Out of Food in the Last  Year: Patient refused    Ran Out of Food in the Last Year: Patient refused  Transportation Needs: Unknown (06/14/2022)   PRAPARE - Administrator, Civil ServiceTransportation    Lack of Transportation (Medical): Patient refused    Lack of Transportation (Non-Medical): Patient refused  Physical Activity: Not on file  Stress: Not on file  Social Connections: Not on file    Current Medications: Current Facility-Administered Medications  Medication Dose Route Frequency Provider Last Rate Last Admin   acetaminophen (TYLENOL) tablet 650 mg  650 mg Oral Q6H PRN Ajibola, Ene A, NP       alum & mag hydroxide-simeth (MAALOX/MYLANTA) 200-200-20 MG/5ML suspension 30 mL  30 mL Oral Q4H PRN Ajibola, Ene A, NP       amLODipine (NORVASC) tablet 5 mg  5 mg Oral Daily Park PopeJi, Percy Comp, MD   5 mg at 06/17/22 0941   atorvastatin (LIPITOR) tablet 20 mg  20 mg Oral QHS Princess BruinsNguyen, Julie, DO   20 mg at 06/17/22 2013   cloNIDine (CATAPRES) tablet 0.1 mg  0.1 mg Oral Q8H PRN Bartholomew CrewsSingleton, Amy E, MD   0.1 mg at 06/16/22 1833   docusate sodium (COLACE) capsule 100 mg  100 mg Oral Daily PRN Comer LocketSingleton, Amy E, MD       magnesium hydroxide (MILK OF MAGNESIA) suspension 30 mL  30 mL Oral Daily PRN Ajibola, Ene A, NP       montelukast (SINGULAIR) tablet 10 mg  10 mg Oral QHS Princess BruinsNguyen, Julie, DO   10 mg at 06/17/22 2013   multivitamin with minerals tablet 1 tablet  1 tablet Oral Daily Bartholomew CrewsSingleton, Amy E, MD   1 tablet at 06/17/22 0939   nicotine (NICODERM CQ - dosed in mg/24 hours) patch 14 mg  14 mg Transdermal Daily Ajibola, Ene A, NP   14 mg at 06/16/22 0844   OLANZapine zydis (ZYPREXA) disintegrating tablet 5 mg  5 mg Oral Q12H PRN Park PopeJi, Jasmene Goswami, MD       Or   OLANZapine (ZYPREXA) injection 5 mg  5 mg Intramuscular Q12H PRN Park PopeJi, Rainn Bullinger, MD       OLANZapine (ZYPREXA) tablet 20 mg  20 mg Oral QHS Princess BruinsNguyen, Julie, DO   20 mg at 06/17/22 2013   thiamine (Vitamin B-1) tablet 100 mg  100 mg Oral Daily Mason JimSingleton, Amy E, MD   100 mg at 06/17/22 0940   traZODone (DESYREL)  tablet 50 mg  50 mg Oral QHS PRN Comer LocketSingleton, Amy E, MD   50 mg at 06/16/22 2100    Lab Results:  No results found for this or any previous visit (from the past 24 hour(s)).  Blood Alcohol level:  Lab Results  Component Value Date   ETH <10 06/08/2022   ETH <10 06/20/2021    Metabolic Disorder Labs: No results found for: "HGBA1C", "MPG" No results found for: "PROLACTIN" Lab Results  Component Value Date   CHOL 186 06/13/2022   TRIG  272 (H) 06/13/2022   HDL 34 (L) 06/13/2022   CHOLHDL 5.5 06/13/2022   VLDL 54 (H) 06/13/2022   LDLCALC 98 06/13/2022    Physical Findings: CIWA:  CIWA-Ar Total: 0  Musculoskeletal: Strength & Muscle Tone: within normal limits Gait & Station: normal  Psychiatric Specialty Exam:  Presentation  General Appearance: Appropriate for Environment; Casual; Fairly Groomed   Eye Contact:Fair   Speech:Clear and Coherent; Normal Rate   Speech Volume:Normal   Handedness:Right    Mood and Affect  Mood:- irritable and frustrated   Affect:congruent, constricted    Thought Process  Thought Processes:Disorganized   Descriptions of Associations:Tangential   Orientation:Oriented to month, year, and city, not situation   Thought Content:Paranoid and grandiose delusions; illogical belief he is a voodoo doll and vampire; reports AVH; denies ideas of reference; has belief in telekinesis; denies SI or HI   History of Schizophrenia/Schizoaffective disorder:Yes   Duration of Psychotic Symptoms:Greater than six months   Hallucinations:AVH - seeing dead people and AH from spirit world   Suicidal Thoughts:denies  Homicidal Thoughts:denies   Sensorium  Memory: Limited secondary to psychosis   Judgment:Impaired   Insight:None    Executive Functions  Concentration:Fair   Attention Span:Fair  Recall:not formally tested   [employer] of Knowledge:Limited    Language:Fair    Psychomotor Activity  Psychomotor Activity:  resting in bed - uncooperative for AIMS testing; no akathesias or tremors noted   Assets  Assets:Communication Skills; Desire for Improvement  Physical Exam: Review of Systems  Respiratory:  Negative for shortness of breath.   Cardiovascular:  Negative for chest pain.  Gastrointestinal:  Negative for abdominal pain, constipation, diarrhea, heartburn, nausea and vomiting.  Neurological:  Negative for headaches.   Blood pressure 125/86, pulse (!) 118, temperature (!) 97.5 F (36.4 C), temperature source Oral, resp. rate 18, height 6\' 2"  (1.88 m), weight 130.2 kg, SpO2 98 %. Body mass index is 36.85 kg/m.   ASSESSMENT AND PLAN Gregory Saunders is a 26 y.o., male with PMH significant for schizoaffective disorder-bipolar type, cannabis use disorder, tobacco use disorder, who presented to [facility] (06/08/2022) via parents for assaulting foster parents and bizarre behaviors, then admitted to [facility] (06/13/2022) Involuntary treatment of acute psychosis in the setting of medication nonadherence and THC use. This is hospitalization day 5.  PLAN Safety and Monitoring: Involuntary admission to inpatient psychiatric unit for safety, stabilization and treatment Daily contact with patient to assess and evaluate symptoms and progress in treatment Patient's case to be discussed in multi-disciplinary team meeting Observation Level : q15 minute checks Vital signs: q12 hours Precautions: suicide, elopement, and assault  2. Psychiatric Diagnoses and Treatment:  # Schizoaffective, bipolar type Initial first episode psychosis in 2019, while he was in college after [facility] use. Patient presented agitated, psychotic, with symptoms of AVH that are noncommanding, ideas of reference, superpowers, disorganized thought and speech. Some sxs of mania with agitation, flight of ideas, insomnia, mood lability. This improved with zyprexa.  Home Rx included Zyprexa 10 mg daily and Risperdal 3 mg  daily. At [place] was discontinued, and Zyprexa 10 mg continued. Continue Zyprexa 20 mg qHS - attempting to maximize one atypical antipsychotic if tolerated for monotherapy - may need further dose titration or transition to different agent if he does not continue to clear Metabolic profile and EKG monitoring obtained while on an atypical antipsychotic (BMI 36.83 Lipid Panel nonfasting: LDL 98, cholesterol 186, triglycerides 272, HDL 34 (06/13/2022), HbgA1c: ordered). QTc: 450 (06/14/2022))  Agitation protocol ordered PRN           3. Medical Issues Being Addressed:  #Elevated creatinine Creatinine 1.33 (appears chronic compared to labs 11-12 months ago when creatinine was 1.35-1.46) Rechecking BMP when he will cooperate for labs Encourage p.o. fluids   #Prediabetes A1c 5.8 per care everywhere  Obtain A1c while on atypical antipsychotic when he will cooperate for labs   #Hypercholesterolemia Continue home atorvastatin 20 mg qHS   # Incomplete right bundle branch block # OSA QRS duration 100, could be normal variant. Does have h/o OSA, would benefit from CPAP. Saw sleep clinic in June 2023.  Follow-up with PCP Would benefit from resuming sleep clinic f/u for CPAP   #Tobacco Use Disorder #Cannabis use disorder # r/o ETOH use disorder             -- On CIWA protocol with PRN Ativan for scores >10, MVI and thiamine oral replacement (recent scores 0,0) -- Nicotine patch 21mg /24 hours ordered             -- Smoking cessation encouraged and illicit drug use discouraged   # Seasonal allergies # Atopic dermatitis Continued home singulare   #Hypertension, stable 125/86 Will continue Clonidine PRN for SBP >160 or DBP >110 and monitor Continue amlodipine 5 mg daily and will increase to 10 mg in a few days if BP continues to be elevated   4. Discharge Planning:  -- Social work and case management to assist with discharge planning and identification of hospital follow-up needs prior to  discharge             -- Estimated LOS: 7-10 days             -- Discharge Concerns: Need to establish a safety plan; Medication compliance and effectiveness             -- Discharge Goals: Return home with outpatient referrals for mental health follow-up including medication management/psychotherapy Patient unable to return home to parents - mother looking into boarding house options Patient has been served 50b since [facility] admission   03-16-1982, MD 06/18/2022, 7:20 AM

## 2022-06-18 NOTE — Group Note (Signed)
Recreation Therapy Group Note   Group Topic:Communication  Group Date: 06/18/2022 Start Time: 2355 End Time: 1035 Facilitators: [name], LRT,CTRS Location: [address] Dayroom    Goal Area(s) Addresses:  Patient will effectively listen to complete activity.  Patient will identify communication skills used to make activity successful.  Patient will identify how skills used during activity can be used to reach post d/c goals.    Group Description: Geometric Drawings.  Three volunteers from the peer group will be shown an abstract picture with a particular arrangement of geometrical shapes.  Each round, one 'speaker' will describe the pattern, as accurately as possible without revealing the image to the group.  The remaining group members will listen and draw the picture to reflect how it is described to them. Patients with the role of 'listener' cannot ask clarifying questions but, may request that the speaker repeat a direction. Once the drawings are complete, the presenter will show the rest of the group the picture and compare how close each person came to drawing the picture. LRT will facilitate a post-activity discussion regarding effective communication and the importance of planning, listening, and asking for clarification in daily interactions with others.   Affect/Mood: N/A   Participation Level: Did not attend    Clinical Observations/Individualized Feedback:     Plan: Continue to engage patient in RT group sessions 2-3x/week.   [name], LRT,CTRS 06/18/2022 12:04 PM

## 2022-06-19 ENCOUNTER — Encounter (HOSPITAL_COMMUNITY): Payer: Self-pay

## 2022-06-19 DIAGNOSIS — F250 Schizoaffective disorder, bipolar type: Secondary | ICD-10-CM | POA: Diagnosis not present

## 2022-06-19 MED ORDER — HALOPERIDOL 5 MG PO TABS
5.0000 mg | ORAL_TABLET | Freq: Two times a day (BID) | ORAL | Status: DC
  Administered 2022-06-20 – 2022-06-21 (×3): 5 mg via ORAL
  Filled 2022-06-19 (×6): qty 1

## 2022-06-19 MED ORDER — BENZTROPINE MESYLATE 1 MG PO TABS: 1.0000 mg | ORAL_TABLET | Freq: Two times a day (BID) | ORAL | Status: DC | PRN

## 2022-06-19 MED ORDER — AMLODIPINE BESYLATE 10 MG PO TABS
10.0000 mg | ORAL_TABLET | Freq: Every day | ORAL | Status: DC
  Administered 2022-06-20 – 2022-07-09 (×20): 10 mg via ORAL
  Filled 2022-06-19 (×3): qty 1
  Filled 2022-06-19: qty 7
  Filled 2022-06-19 (×14): qty 1
  Filled 2022-06-19: qty 7
  Filled 2022-06-19 (×3): qty 1

## 2022-06-19 MED ORDER — HALOPERIDOL 5 MG PO TABS: 5.0000 mg | ORAL_TABLET | Freq: Three times a day (TID) | ORAL | Status: DC | PRN

## 2022-06-19 MED ORDER — DIPHENHYDRAMINE HCL 50 MG/ML IJ SOLN: 50.0000 mg | Freq: Four times a day (QID) | INTRAMUSCULAR | Status: DC | PRN

## 2022-06-19 MED ORDER — LORAZEPAM 2 MG/ML IJ SOLN: 2.0000 mg | Freq: Four times a day (QID) | INTRAMUSCULAR | Status: DC | PRN

## 2022-06-19 MED ORDER — LORAZEPAM 1 MG PO TABS: 1.0000 mg | ORAL_TABLET | Freq: Three times a day (TID) | ORAL | Status: DC | PRN

## 2022-06-19 MED ORDER — HALOPERIDOL LACTATE 5 MG/ML IJ SOLN: 5.0000 mg | Freq: Four times a day (QID) | INTRAMUSCULAR | Status: DC | PRN

## 2022-06-19 MED ORDER — OLANZAPINE 10 MG PO TABS
20.0000 mg | ORAL_TABLET | Freq: Every day | ORAL | Status: AC
  Administered 2022-06-19: 20 mg via ORAL
  Filled 2022-06-19: qty 2

## 2022-06-19 NOTE — [facility] Group Notes (Signed)
Spirituality group facilitated by Chaplain Katy Gregg Holster, BCC.   Group Description: Group focused on topic of hope. Patients participated in facilitated discussion around topic, connecting with one another around experiences and definitions for hope. Group members engaged with visual explorer photos, reflecting on what hope looks like for them today. Group engaged in discussion around how their definitions of hope are present today in hospital.   Modalities: Psycho-social ed, Adlerian, Narrative, MI   Patient Progress: Did not attend.  

## 2022-06-19 NOTE — Progress Notes (Signed)
Adult Psychoeducational Group Note  Date:  06/19/2022 Time:  10:23 PM  Group Topic/Focus:  Wrap-Up Group:   The focus of this group is to help patients review their daily goal of treatment and discuss progress on daily workbooks.  Participation Level:  Did Not Attend  Participation Quality:   Did Not Attend  Affect:   Did Not Attend  Cognitive:   Did Not Attend  Insight: None  Engagement in Group:   Did Not Attend  Modes of Intervention:   Did Not Attend  Additional Comments:  Pt was encouraged to attend wrap up group but did not attend.  [name] 06/19/2022, 10:23 PM

## 2022-06-19 NOTE — Plan of Care (Signed)
Spoke with mother to talk about housing and present state of psychosis.  I did talk to mother about what I have observed regarding patient and she reports that this is very similar to his baseline.  Reports that after starting to use marijuana in 2019, patient has had steady declining cognition and worsening of psychosis.  Mother stated that initially his psychotic symptoms would clear after discontinuation of THC use but patient will constantly relapse on THC leading to longer durations of psychosis.  Mother has not spoken with patient for some time now so she is not aware of whether patient is currently at his baseline or not.  Mother says that patient has been very evasive regarding his psychosis and is able to hide it well but she has seen him multiple times responding to internal stimuli as well as being very reserved and quiet when he was living with mom.  Mother is very worried and afraid of being around patient as he has been very physically aggressive which was new and concerning to her as he rarely has done this in the past.   Mother does note that patient's father side has known family history of schizophrenia.    Regarding housing, mother is having difficulty with options for patient.  I will discuss with him tomorrow regarding going to a boardinghouse versus homeless shelter. Mother wants [facility] to call patient to help with housing and resources.   France Ravens, MD PGY2

## 2022-06-19 NOTE — Progress Notes (Signed)
   06/19/22 2118  Psych Admission Type (Psych Patients Only)  Admission Status Involuntary  Psychosocial Assessment  Patient Complaints None  Eye Contact Fair  Facial Expression Fixed smile  Affect Appropriate to circumstance  Speech Logical/coherent  Interaction Minimal  Motor Activity Other (Comment) (wnl)  Appearance/Hygiene Unremarkable;In scrubs  Behavior Characteristics Cooperative  Mood Preoccupied;Suspicious;Pleasant  Thought Process  Coherency Circumstantial  Content Preoccupation  Delusions None reported or observed  Perception Hallucinations  Hallucination Auditory;Visual (sees and hears and speaks to spirits)  Judgment Impaired  Confusion None  Danger to Self  Current suicidal ideation? Denies  Danger to Others  Danger to Others None reported or observed   Progress note   D: Pt seen in his room sitting on the bed. Pt denies SI, HI. Endorses AVH - seeing and hearing spirits. "I talk to them." Pt rates pain  0/10. Pt rates anxiety  0/10 and depression  0/10. Pt has not attended groups today. Pt endorses fair sleep and good appetite. Pt isolates to his room but will come out to get fluids and medication. Still refusing lab work. No other concerns noted at this time.   A: Pt provided support and encouragement. Pt given scheduled medication as prescribed. PRNs as appropriate. Q15 min checks for safety.   R: Pt safe on the unit. Will continue to monitor.

## 2022-06-19 NOTE — Group Note (Signed)
Recreation Therapy Group Note   Group Topic:Team Building  Group Date: 06/19/2022 Start Time: 1010 End Time: 1040 Facilitators: [name], LRT,CTRS Location: [address] Dayroom   Goal Area(s) Addresses:  Patient will effectively work with peer towards shared goal.  Patient will identify skills used to make activity successful.  Patient will identify how skills used during activity can be applied to reach post d/c goals.   Group Description: [employer]. In teams of 5-6, patients were given 12 craft pipe cleaners. Using the materials provided, patients were instructed to compete again the opposing team(s) to build the tallest free-standing structure from floor level. The activity was timed; difficulty increased by Probation officer as Pharmacist, hospital continued.  Systematically resources were removed with additional directions for example, placing one arm behind their back, working in silence, and shape stipulations. LRT facilitated post-activity discussion reviewing team processes and necessary communication skills involved in completion. Patients were encouraged to reflect how the skills utilized, or not utilized, in this activity can be incorporated to positively impact support systems post discharge.   Affect/Mood: N/A   Participation Level: Did not attend    Clinical Observations/Individualized Feedback:     Plan: Continue to engage patient in RT group sessions 2-3x/week.   [name], LRT,CTRS 06/19/2022 1:12 PM

## 2022-06-19 NOTE — Group Note (Signed)
LCSW Group Therapy Note   Group Date: 06/19/2022 Start Time: 1300 End Time: 1400   Type of Therapy and Topic:  Group Therapy: Boundaries  Participation Level:  Did Not Attend  Description of Group: This group will address the use of boundaries in their personal lives. Patients will explore why boundaries are important, the difference between healthy and unhealthy boundaries, and negative and postive outcomes of different boundaries and will look at how boundaries can be crossed.  Patients will be encouraged to identify current boundaries in their own lives and identify what kind of boundary is being set. Facilitators will guide patients in utilizing problem-solving interventions to address and correct types boundaries being used and to address when no boundary is being used. Understanding and applying boundaries will be explored and addressed for obtaining and maintaining a balanced life. Patients will be encouraged to explore ways to assertively make their boundaries and needs known to significant others in their lives, using other group members and facilitator for role play, support, and feedback.  Therapeutic Goals:  1.  Patient will identify areas in their life where setting clear boundaries could be  used to improve their life.  2.  Patient will identify signs/triggers that a boundary is not being respected. 3.  Patient will identify two ways to set boundaries in order to achieve balance in  their lives: 4.  Patient will demonstrate ability to communicate their needs and set boundaries  through discussion and/or role plays  Summary of Patient Progress:  Did not attend  Therapeutic Modalities:   Cognitive [place], LCSW 06/19/2022  2:52 PM

## 2022-06-19 NOTE — Progress Notes (Signed)
[facility] MD Progress Note  06/19/2022 7:37 AM Gregory Saunders  MRN:  [id number] Principal Problem: Schizoaffective disorder, bipolar type ([place]) Diagnosis: Principal Problem:   Schizoaffective disorder, bipolar type ([place]) Active Problems:   Cannabis use disorder   OSA (obstructive sleep apnea)   Prediabetes   Tobacco use disorder   Reason for Admission: Worsening psychosis   Subjective:  Patient was seen and assessed at bedside.  Patient less ruminative regarding going to court.  Patient denies any major concerns with medications.  Discussed yesterday's incident per nursing report that patient had an episode of agitation.  Patient reported some degree of frustration but it appears to be behavioral. Patient continues to be withdrawn but less preoccupied.  Patient seems to be less physically aggressive than initially on admission.  Patient is redirectable despite ruminations.  But denies any acute complaints.  Patient denies SI/HI today.  He continues to report AH and VH related to the spirit world.  Reports no somatic complaints from medications.  Denies mood or anxiety symptoms. Continues to have fixed delusion of being born as a vampire and is also a voodoo doll.  Patient continues to be amenable to discharging to homeless shelter at time of discharge but expresses preference to going to a boardinghouse.  Discussed that we would need to get lab draws prior to discharge patient seemed to understand this but unclear whether patient will continue to refuse it.  Objective:  Chart Review Past 24 hours of patient's chart was reviewed.  Patient is compliant with scheduled meds. Required Agitation PRNs: none Per RN notes, 1 episode of behavioral problems but was redirectable and did not require chemical sedation. Patient slept, 7 hours  Total Time Spent in Direct Patient Care:  I personally spent 30 minutes on the unit in direct patient care. The direct patient care time included face-to-face  time with the patient, reviewing the patient's chart, communicating with other professionals, and coordinating care. Greater than 50% of this time was spent in counseling or coordinating care with the patient regarding goals of hospitalization, psycho-education, and discharge planning needs.   Past Psychiatric History: see H&P  Past Medical History:  Past Medical History:  Diagnosis Date   Cannabis use disorder 10/16/2020   Schizophrenia ([place])    Strain of right Achilles tendon 04/07/2019   Tobacco use disorder 06/14/2022    Past Surgical History:  Procedure Laterality Date   stye removal     WISDOM TOOTH EXTRACTION     Family History: History reviewed. No pertinent family history. Family Psychiatric  History: see H&P Social History:  Social History   Substance and Sexual Activity  Alcohol Use Never     Social History   Substance and Sexual Activity  Drug Use Not Currently   Types: Marijuana    Social History   Socioeconomic History   Marital status: Single    Spouse name: Not on file   Number of children: Not on file   Years of education: Not on file   Highest education level: Not on file  Occupational History   Not on file  Tobacco Use   Smoking status: Every Day    Packs/day: 0.50    Types: Cigarettes   Smokeless tobacco: Never  Vaping Use   Vaping Use: Every day   Substances: CBD  Substance and Sexual Activity   Alcohol use: Never   Drug use: Not Currently    Types: Marijuana   Sexual activity: Not on file  Other Topics Concern  Not on file  Social History Narrative   Not on file   Social Determinants of Health   Financial Resource Strain: Not on file  Food Insecurity: Unknown (06/14/2022)   Hunger Vital Sign    Worried About Running Out of Food in the Last Year: Patient refused    [place] in the Last Year: Patient refused  Transportation Needs: Unknown (06/14/2022)   [place] - Hydrologist (Medical): Patient  refused    Lack of Transportation (Non-Medical): Patient refused  Physical Activity: Not on file  Stress: Not on file  Social Connections: Not on file    Current Medications: Current Facility-Administered Medications  Medication Dose Route Frequency Provider Last Rate Last Admin   acetaminophen (TYLENOL) tablet 650 mg  650 mg Oral Q6H PRN Ajibola, Ene A, NP       alum & mag hydroxide-simeth (MAALOX/MYLANTA) 200-200-20 MG/5ML suspension 30 mL  30 mL Oral Q4H PRN Ajibola, Ene A, NP       amLODipine (NORVASC) tablet 5 mg  5 mg Oral Daily France Ravens, MD   5 mg at 06/18/22 0831   atorvastatin (LIPITOR) tablet 20 mg  20 mg Oral QHS Merrily Brittle, DO   20 mg at 06/18/22 2042   docusate sodium (COLACE) capsule 100 mg  100 mg Oral Daily PRN Harlow Asa, MD       magnesium hydroxide (MILK OF MAGNESIA) suspension 30 mL  30 mL Oral Daily PRN Ajibola, Ene A, NP       montelukast (SINGULAIR) tablet 10 mg  10 mg Oral QHS Merrily Brittle, DO   10 mg at 06/18/22 2042   multivitamin with minerals tablet 1 tablet  1 tablet Oral Daily Viann Fish E, MD   1 tablet at 06/18/22 0831   nicotine (NICODERM CQ - dosed in mg/24 hours) patch 14 mg  14 mg Transdermal Daily Ajibola, Ene A, NP   14 mg at 06/16/22 0844   OLANZapine zydis (ZYPREXA) disintegrating tablet 5 mg  5 mg Oral Q12H PRN France Ravens, MD       Or   OLANZapine (ZYPREXA) injection 5 mg  5 mg Intramuscular Q12H PRN France Ravens, MD       OLANZapine (ZYPREXA) tablet 20 mg  20 mg Oral QHS Merrily Brittle, DO   20 mg at 06/18/22 2042   thiamine (Vitamin B-1) tablet 100 mg  100 mg Oral Daily Nelda Marseille, Amy E, MD   100 mg at 06/18/22 0831   traZODone (DESYREL) tablet 50 mg  50 mg Oral QHS PRN Harlow Asa, MD   50 mg at 06/16/22 2100    Lab Results:  No results found for this or any previous visit (from the past 24 hour(s)).  Blood Alcohol level:  Lab Results  Component Value Date   ETH <10 06/08/2022   ETH <10 [id number]    Metabolic Disorder  Labs: No results found for: "HGBA1C", "MPG" No results found for: "PROLACTIN" Lab Results  Component Value Date   CHOL 186 06/13/2022   TRIG 272 (H) 06/13/2022   HDL 34 (L) 06/13/2022   CHOLHDL 5.5 06/13/2022   VLDL 54 (H) 06/13/2022   LDLCALC 98 06/13/2022    Physical Findings: CIWA:  CIWA-Ar Total: 0  Musculoskeletal: Strength & Muscle Tone: within normal limits Gait & Station: normal  Psychiatric Specialty Exam:  Presentation  General Appearance: Appropriate for Environment; Casual; Fairly Groomed   Eye Contact:Fair   Speech:Clear and Coherent;  Normal Rate   Speech Volume:Normal   Handedness:Right    Mood and Affect  Mood:- irritable and frustrated   Affect:congruent, constricted    Thought Process  Thought Processes:Disorganized   Descriptions of Associations:Tangential   Orientation:Oriented to month, year, and city, not situation   Thought Content:Paranoid and grandiose delusions; illogical belief he is a voodoo doll and vampire; reports AVH; denies ideas of reference; has belief in telekinesis; denies SI or HI   History of Schizophrenia/Schizoaffective disorder:Yes   Duration of Psychotic Symptoms:Greater than six months   Hallucinations:AVH - seeing dead people and AH from spirit world   Suicidal Thoughts:denies  Homicidal Thoughts:denies   Sensorium  Memory: Limited secondary to psychosis   Judgment:Impaired   Insight:None    Executive Functions  Concentration:Fair   Attention Span:Fair  Recall:not formally tested   [employer] of Knowledge:Limited    Language:Fair    Psychomotor Activity  Psychomotor Activity: resting in bed - uncooperative for AIMS testing; no akathesias or tremors noted   Assets  Assets:Communication Skills; Desire for Improvement  Physical Exam: Review of Systems  Respiratory:  Negative for shortness of breath.   Cardiovascular:  Negative for chest pain.  Gastrointestinal:  Negative  for abdominal pain, constipation, diarrhea, heartburn, nausea and vomiting.  Neurological:  Negative for headaches.   Blood pressure (!) 142/78, pulse 100, temperature 97.8 F (36.6 C), resp. rate 16, height 6\' 2"  (1.88 m), weight 130.2 kg, SpO2 98 %. Body mass index is 36.85 kg/m.   ASSESSMENT AND PLAN Gregory Saunders is a 25 y.o., male with PMH significant for schizoaffective disorder-bipolar type, cannabis use disorder, tobacco use disorder, who presented to [facility] (06/08/2022) via parents for assaulting foster parents and bizarre behaviors, then admitted to [facility] (06/13/2022) Involuntary treatment of acute psychosis in the setting of medication nonadherence and THC use. This is hospitalization day 6.  PLAN Safety and Monitoring: Involuntary admission to inpatient psychiatric unit for safety, stabilization and treatment Daily contact with patient to assess and evaluate symptoms and progress in treatment Patient's case to be discussed in multi-disciplinary team meeting Observation Level : q15 minute checks Vital signs: q12 hours Precautions: suicide, elopement, and assault  2. Psychiatric Diagnoses and Treatment:  # Schizoaffective, bipolar type Initial first episode psychosis in 2019, while he was in college after [facility] use. Patient presented agitated, psychotic, with symptoms of AVH that are noncommanding, ideas of reference, superpowers, disorganized thought and speech. Some sxs of mania with agitation, flight of ideas, insomnia, mood lability. This improved with zyprexa.  Home Rx included Zyprexa 10 mg daily and Risperdal 3 mg daily. At [place] was discontinued, and Zyprexa 10 mg continued. Continue Zyprexa 20 mg qHS - attempting to maximize one atypical antipsychotic if tolerated for monotherapy - may need further dose titration or transition to different agent if he does not continue to clear Metabolic profile and EKG monitoring obtained while on an  atypical antipsychotic (BMI 36.83 Lipid Panel nonfasting: LDL 98, cholesterol 186, triglycerides 272, HDL 34 (06/13/2022), HbgA1c: ordered). QTc: 450 (06/14/2022)) Agitation protocol ordered PRN           3. Medical Issues Being Addressed:  #Elevated creatinine Creatinine 1.33 (appears chronic compared to labs 11-12 months ago when creatinine was 1.35-1.46) Rechecking BMP when he will cooperate for labs Encourage p.o. fluids   #Prediabetes A1c 5.8 per care everywhere  Obtain A1c while on atypical antipsychotic when he will cooperate for labs   #Hypercholesterolemia Continue home atorvastatin 20 mg  qHS   # Incomplete right bundle branch block # OSA QRS duration 100, could be normal variant. Does have h/o OSA, would benefit from CPAP. Saw sleep clinic in June 2023.  Follow-up with PCP Would benefit from resuming sleep clinic f/u for CPAP   #Tobacco Use Disorder #Cannabis use disorder # r/o ETOH use disorder             -- On CIWA protocol with PRN Ativan for scores >10, MVI and thiamine oral replacement (recent scores 0,0) -- Nicotine patch 21mg /24 hours ordered             -- Smoking cessation encouraged and illicit drug use discouraged   # Seasonal allergies # Atopic dermatitis Continued home singulare   #Hypertension, stable 125/86 Will continue Clonidine PRN for SBP >160 or DBP >110 and monitor Increase Amlodipine to 10 mg as blood pressure continues to be elevated   4. Discharge Planning:  -- Social work and case management to assist with discharge planning and identification of hospital follow-up needs prior to discharge             -- Estimated LOS: 7-10 days             -- Discharge Concerns: Need to establish a safety plan; Medication compliance and effectiveness             -- Discharge Goals: Return home with outpatient referrals for mental health follow-up including medication management/psychotherapy Patient unable to return home to parents - mother looking into  boarding house options Patient has been served 50b since [place] admission   France Ravens, MD 06/19/2022, 7:37 AM

## 2022-06-19 NOTE — Progress Notes (Signed)
   06/19/22 0558  Sleep  Number of Hours 7.5

## 2022-06-19 NOTE — B[facility] Counselor (Signed)
[facility] LCSW Progress Note   06/19/2022    12:38 PM  SURYA SCHROETER   [phone]   Type of Contact and Topic:  collateral with mother  CSW received a voicemail from mother regarding any updates about discharge and patient current progress.  CSW messaged physicians who discussed calling mother today.  CSW requested for additional information regarding mothers progress on getting a boarding room.  Physicians agreed to ask mother about information.     Signed:  Riki Altes, MSW, LCSW, LCAS 06/19/2022 12:38 PM

## 2022-06-19 NOTE — Progress Notes (Signed)
Pt refused to have lab work drawn this morning.

## 2022-06-19 NOTE — Progress Notes (Addendum)
Pt continues to be guarded, withdrawned & isolative to room this shift. Affect remains blunted with intense eye contact, irritable mood. Denies SI, HI, AH and pain. Reports +VH of spirits "I still see the spirits, my family spirits in [place]. I see your grandmother behind you now". Rates his anxiety, depression and hopelessness all 0/10. Compliant with medications when offered "I've been talking to you for a while, so you know I swallowed my meds". Support, encouragement and reassurance provided to pt. Safety checks maintained at Q 15 minutes intervals without outburst thus far. Pt tolerates meals and fluids well.  Denies concerns at this time.

## 2022-06-19 NOTE — BH IP Treatment Plan (Signed)
Interdisciplinary Treatment and Diagnostic Plan Update  06/19/2022 Time of Session: 0830 Gregory Saunders MRN: [id number]  Principal Diagnosis: Schizoaffective disorder, bipolar type [facility])  Secondary Diagnoses: Principal Problem:   Schizoaffective disorder, bipolar type ([facility]) Active Problems:   Cannabis use disorder   OSA (obstructive sleep apnea)   Prediabetes   Tobacco use disorder   Current Medications:  Current Facility-Administered Medications  Medication Dose Route Frequency Provider Last Rate Last Admin   acetaminophen (TYLENOL) tablet 650 mg  650 mg Oral Q6H PRN Ajibola, Ene A, NP       alum & mag hydroxide-simeth (MAALOX/MYLANTA) 200-200-20 MG/5ML suspension 30 mL  30 mL Oral Q4H PRN Ajibola, Ene A, NP       [START ON 06/20/2022] amLODipine (NORVASC) tablet 10 mg  10 mg Oral Daily Park Pope, MD       atorvastatin (LIPITOR) tablet 20 mg  20 mg Oral QHS Princess Bruins, DO   20 mg at 06/18/22 2042   docusate sodium (COLACE) capsule 100 mg  100 mg Oral Daily PRN Comer Locket, MD       magnesium hydroxide (MILK OF MAGNESIA) suspension 30 mL  30 mL Oral Daily PRN Ajibola, Ene A, NP       montelukast (SINGULAIR) tablet 10 mg  10 mg Oral QHS Princess Bruins, DO   10 mg at 06/18/22 2042   multivitamin with minerals tablet 1 tablet  1 tablet Oral Daily Comer Locket, MD   1 tablet at 06/19/22 0821   OLANZapine zydis (ZYPREXA) disintegrating tablet 5 mg  5 mg Oral Q12H PRN Park Pope, MD       Or   OLANZapine [facility]) injection 5 mg  5 mg Intramuscular Q12H PRN Park Pope, MD       OLANZapine (ZYPREXA) tablet 20 mg  20 mg Oral QHS Princess Bruins, DO   20 mg at 06/18/22 2042   thiamine (Vitamin B-1) tablet 100 mg  100 mg Oral Daily Bartholomew Crews E, MD   100 mg at 06/19/22 9622   traZODone (DESYREL) tablet 50 mg  50 mg Oral QHS PRN Comer Locket, MD   50 mg at 06/16/22 2100   PTA Medications: Medications Prior to Admission  Medication Sig Dispense Refill Last Dose    OLANZapine (ZYPREXA) 10 MG tablet Take 1 tablet (10 mg total) by mouth at bedtime. 30 tablet 2 Past Month   risperiDONE (RISPERDAL) 3 MG tablet Take 3 mg by mouth 2 (two) times daily.   Past Month    Patient Stressors: Marital or family conflict   Medication change or noncompliance   Occupational concerns   Substance abuse    Patient Strengths: Active sense of humor  Physical Health  Work skills   Treatment Modalities: Medication Management, Group therapy, Case management,  1 to 1 session with clinician, Psychoeducation, Recreational therapy.   Physician Treatment Plan for Primary Diagnosis: Schizoaffective disorder, bipolar type (HCC) Long Term Goal(s):     Short Term Goals:    Medication Management: Evaluate patient's response, side effects, and tolerance of medication regimen.  Therapeutic Interventions: 1 to 1 sessions, Unit Group sessions and Medication administration.  Evaluation of Outcomes: Progressing  Physician Treatment Plan for Secondary Diagnosis: Principal Problem:   Schizoaffective disorder, bipolar type (HCC) Active Problems:   Cannabis use disorder   OSA (obstructive sleep apnea)   Prediabetes   Tobacco use disorder  Long Term Goal(s):     Short Term Goals:  Medication Management: Evaluate patient's response, side effects, and tolerance of medication regimen.  Therapeutic Interventions: 1 to 1 sessions, Unit Group sessions and Medication administration.  Evaluation of Outcomes: Progressing   RN Treatment Plan for Primary Diagnosis: Schizoaffective disorder, bipolar type ([place]) Long Term Goal(s): Knowledge of disease and therapeutic regimen to maintain health will improve  Short Term Goals: Ability to remain free from injury will improve, Ability to verbalize frustration and anger appropriately will improve, Ability to demonstrate self-control, Ability to participate in decision making will improve, Ability to verbalize feelings will improve,  Ability to disclose and discuss suicidal ideas, Ability to identify and develop effective coping behaviors will improve, and Compliance with prescribed medications will improve  Medication Management: RN will administer medications as ordered by provider, will assess and evaluate patient's response and provide education to patient for prescribed medication. RN will report any adverse and/or side effects to prescribing provider.  Therapeutic Interventions: 1 on 1 counseling sessions, Psychoeducation, Medication administration, Evaluate responses to treatment, Monitor vital signs and CBGs as ordered, Perform/monitor CIWA, COWS, AIMS and Fall Risk screenings as ordered, Perform wound care treatments as ordered.  Evaluation of Outcomes: Progressing   LCSW Treatment Plan for Primary Diagnosis: Schizoaffective disorder, bipolar type ([place]) Long Term Goal(s): Safe transition to appropriate next level of care at discharge, Engage patient in therapeutic group addressing interpersonal concerns.  Short Term Goals: Engage patient in aftercare planning with referrals and resources, Increase social support, Increase ability to appropriately verbalize feelings, Increase emotional regulation, Facilitate acceptance of mental health diagnosis and concerns, Facilitate patient progression through stages of change regarding substance use diagnoses and concerns, Identify triggers associated with mental health/substance abuse issues, and Increase skills for wellness and recovery  Therapeutic Interventions: Assess for all discharge needs, 1 to 1 time with Social worker, Explore available resources and support systems, Assess for adequacy in community support network, Educate family and significant other(s) on suicide prevention, Complete Psychosocial Assessment, Interpersonal group therapy.  Evaluation of Outcomes: Progressing   Progress in Treatment: Attending groups: No. Participating in groups: No. Taking medication  as prescribed: Yes. Toleration medication: Yes. Family/Significant other contact made: Yes, individual(s) contacted:  Gregory Saunders, [phone] Patient understands diagnosis: Yes. Discussing patient identified problems/goals with staff: Yes. Medical problems stabilized or resolved: Yes. Denies suicidal/homicidal ideation: No. Issues/concerns per patient self-inventory: Yes. Other: none  New problem(s) identified: No, Describe:  none  New Short Term/Long Term Goal(s): Patient to work towards detox, elimination of symptoms of psychosis, medication management for mood stabilization; elimination of SI thoughts; development of comprehensive mental wellness/sobriety plan.  Patient Goals:  No additional goals identified at this time. Patient to continue to work towards original goals identified in initial treatment team meeting. CSW will remain available to patient should they voice additional treatment goals.   Discharge Plan or Barriers: No psychosocial barriers identified at this time, patient to return to place of residence when appropriate for discharge.   Reason for Continuation of Hospitalization: Other; describe psychosis  Estimated Length of Stay: 1-7 days    Scribe for Treatment Team: Larose Kells 06/19/2022 2:58 PM

## 2022-06-20 DIAGNOSIS — F250 Schizoaffective disorder, bipolar type: Secondary | ICD-10-CM | POA: Diagnosis not present

## 2022-06-20 NOTE — Plan of Care (Signed)
  Problem: Coping: Goal: Ability to verbalize frustrations and anger appropriately will improve Outcome: Progressing   Problem: Coping: Goal: Ability to demonstrate self-control will improve Outcome: Progressing   Problem: Safety: Goal: Periods of time without injury will increase Outcome: Progressing   

## 2022-06-20 NOTE — Progress Notes (Addendum)
[facility] MD Progress Note  06/20/2022 9:42 AM Gregory Saunders  MRN:  [id number] Principal Problem: Schizoaffective disorder, bipolar type (HCC) Diagnosis: Principal Problem:   Schizoaffective disorder, bipolar type (HCC) Active Problems:   Cannabis use disorder   OSA (obstructive sleep apnea)   Prediabetes   Tobacco use disorder   Reason for Admission: Worsening psychosis   Subjective:  Patient was seen and assessed at bedside.  Patient yesterday appeared to be very ruminative regarding court.  Patient does not seem to be as labile as he was yesterday.  However, per nursing, patient has been responding to internal stimuli about the "spiritual world" and keeping to himself in his room which is concerning for continued psychosis.  Patient has no concerns and does continue to prefer if he was to be excepted to a boardinghouse.  Patient has no concerns about medication adjustments.  Reports no somatic complaints from medications.  Denies mood or anxiety symptoms. Continues to have fixed delusion of being born as a vampire and is also a voodoo doll.  Patient continues to be amenable to discharging to homeless shelter at time of discharge but expresses preference to going to a boardinghouse.  Discussed that we would need to get lab draws prior to discharge patient seemed to understand this but unclear whether patient will continue to refuse it.  Objective:  Chart Review Past 24 hours of patient's chart was reviewed.  Patient is compliant with scheduled meds. Required Agitation PRNs: none Per RN notes, 1 episode of behavioral problems but was redirectable and did not require chemical sedation. Patient slept, 7 hours  Total Time Spent in Direct Patient Care:  I personally spent 30 minutes on the unit in direct patient care. The direct patient care time included face-to-face time with the patient, reviewing the patient's chart, communicating with other professionals, and coordinating care.  Greater than 50% of this time was spent in counseling or coordinating care with the patient regarding goals of hospitalization, psycho-education, and discharge planning needs.   Past Psychiatric History: see H&P  Past Medical History:  Past Medical History:  Diagnosis Date   Cannabis use disorder 10/16/2020   Schizophrenia (HCC)    Strain of right Achilles tendon 04/07/2019   Tobacco use disorder 06/14/2022    Past Surgical History:  Procedure Laterality Date   stye removal     WISDOM TOOTH EXTRACTION     Family History: History reviewed. No pertinent family history. Family Psychiatric  History: see H&P Social History:  Social History   Substance and Sexual Activity  Alcohol Use Never     Social History   Substance and Sexual Activity  Drug Use Not Currently   Types: Marijuana    Social History   Socioeconomic History   Marital status: Single    Spouse name: Not on file   Number of children: Not on file   Years of education: Not on file   Highest education level: Not on file  Occupational History   Not on file  Tobacco Use   Smoking status: Every Day    Packs/day: 0.50    Types: Cigarettes   Smokeless tobacco: Never  Vaping Use   Vaping Use: Every day   Substances: CBD  Substance and Sexual Activity   Alcohol use: Never   Drug use: Not Currently    Types: Marijuana   Sexual activity: Not on file  Other Topics Concern   Not on file  Social History Narrative   Not on file  Social Determinants of Health   Financial Resource Strain: Not on file  Food Insecurity: Unknown (06/14/2022)   Hunger Vital Sign    Worried About Running Out of Food in the Last Year: Patient refused    [place] in the Last Year: Patient refused  Transportation Needs: Unknown (06/14/2022)   [place] - Hydrologist (Medical): Patient refused    Lack of Transportation (Non-Medical): Patient refused  Physical Activity: Not on file  Stress: Not on  file  Social Connections: Not on file    Current Medications: Current Facility-Administered Medications  Medication Dose Route Frequency Provider Last Rate Last Admin   acetaminophen (TYLENOL) tablet 650 mg  650 mg Oral Q6H PRN Ajibola, Ene A, NP       alum & mag hydroxide-simeth (MAALOX/MYLANTA) 200-200-20 MG/5ML suspension 30 mL  30 mL Oral Q4H PRN Ajibola, Ene A, NP       amLODipine (NORVASC) tablet 10 mg  10 mg Oral Daily France Ravens, MD   10 mg at 06/20/22 0745   atorvastatin (LIPITOR) tablet 20 mg  20 mg Oral QHS Merrily Brittle, DO   20 mg at 06/19/22 2037   benztropine (COGENTIN) tablet 1 mg  1 mg Oral BID PRN France Ravens, MD       haloperidol lactate (HALDOL) injection 5 mg  5 mg Intramuscular Q6H PRN France Ravens, MD       And   LORazepam (ATIVAN) injection 2 mg  2 mg Intramuscular Q6H PRN France Ravens, MD       And   diphenhydrAMINE (BENADRYL) injection 50 mg  50 mg Intravenous Q6H PRN France Ravens, MD       docusate sodium (COLACE) capsule 100 mg  100 mg Oral Daily PRN Nelda Marseille, Amy E, MD       haloperidol (HALDOL) tablet 5 mg  5 mg Oral BID France Ravens, MD   5 mg at 06/20/22 0746   haloperidol (HALDOL) tablet 5 mg  5 mg Oral Q8H PRN France Ravens, MD       And   LORazepam (ATIVAN) tablet 1 mg  1 mg Oral Q8H PRN France Ravens, MD       magnesium hydroxide (MILK OF MAGNESIA) suspension 30 mL  30 mL Oral Daily PRN Ajibola, Ene A, NP       montelukast (SINGULAIR) tablet 10 mg  10 mg Oral QHS Merrily Brittle, DO   10 mg at 06/19/22 2037   multivitamin with minerals tablet 1 tablet  1 tablet Oral Daily Harlow Asa, MD   1 tablet at 06/20/22 0746   thiamine (Vitamin B-1) tablet 100 mg  100 mg Oral Daily Viann Fish E, MD   100 mg at 06/20/22 0746   traZODone (DESYREL) tablet 50 mg  50 mg Oral QHS PRN Harlow Asa, MD   50 mg at 06/16/22 2100    Lab Results:  No results found for this or any previous visit (from the past 24 hour(s)).  Blood Alcohol level:  Lab Results  Component  Value Date   ETH <10 06/08/2022   ETH <10 16/06/9603    Metabolic Disorder Labs: No results found for: "HGBA1C", "MPG" No results found for: "PROLACTIN" Lab Results  Component Value Date   CHOL 186 06/13/2022   TRIG 272 (H) 06/13/2022   HDL 34 (L) 06/13/2022   CHOLHDL 5.5 06/13/2022   VLDL 54 (H) 06/13/2022   [place] 98 06/13/2022    Physical Findings:  CIWA:  CIWA-Ar Total: 0  Musculoskeletal: Strength & Muscle Tone: within normal limits Gait & Station: normal  Psychiatric Specialty Exam:  Presentation  General Appearance: Appropriate for Environment; Casual; Fairly Groomed   Eye Contact:Fair   Speech:Clear and Coherent; Normal Rate   Speech Volume:Normal   Handedness:Right    Mood and Affect  Mood: "fine"   Affect:flat    Thought Process  Thought Processes:Disorganized   Descriptions of Associations:Tangential   Orientation:Oriented to month, year, and city, not situation   Thought Content:Paranoid and grandiose delusions; illogical belief he is a voodoo doll and vampire; reports AVH; denies ideas of reference; has belief in telekinesis; denies SI or HI   History of Schizophrenia/Schizoaffective disorder:Yes   Duration of Psychotic Symptoms:Greater than six months   Hallucinations:AVH - seeing dead people and AH from spirit world   Suicidal Thoughts:denies  Homicidal Thoughts:denies   Sensorium  Memory: Limited secondary to psychosis   Judgment:Impaired   Insight:None    Executive Functions  Concentration:Fair   Attention Span:Fair  Recall:not formally tested   [employer] of Knowledge:Limited    Language:Fair    Psychomotor Activity  Psychomotor Activity: resting in bed - uncooperative for AIMS testing; no akathesias or tremors noted   Assets  Assets:Communication Skills; Desire for Improvement  Physical Exam: Review of Systems  Respiratory:  Negative for shortness of breath.   Cardiovascular:  Negative for  chest pain.  Gastrointestinal:  Negative for abdominal pain, constipation, diarrhea, heartburn, nausea and vomiting.  Neurological:  Negative for headaches.   Blood pressure (!) 146/108, pulse 89, temperature 97.7 F (36.5 C), temperature source Oral, resp. rate 16, height 6\' 2"  (1.88 m), weight 130.2 kg, SpO2 100 %. Body mass index is 36.85 kg/m.   ASSESSMENT AND PLAN Gregory Saunders is a 26 y.o., male with PMH significant for schizoaffective disorder-bipolar type, cannabis use disorder, tobacco use disorder, who presented to [facility] (06/08/2022) via parents for assaulting foster parents and bizarre behaviors, then admitted to [facility] (06/13/2022) Involuntary treatment of acute psychosis in the setting of medication nonadherence and THC use. This is hospitalization day 7.  PLAN Safety and Monitoring: Involuntary admission to inpatient psychiatric unit for safety, stabilization and treatment Daily contact with patient to assess and evaluate symptoms and progress in treatment Patient's case to be discussed in multi-disciplinary team meeting Observation Level : q15 minute checks Vital signs: q12 hours Precautions: suicide, elopement, and assault  2. Psychiatric Diagnoses and Treatment:  # Schizoaffective, bipolar type Patient continues to be psychotic, paranoid, responding to internal stimuli.  Plan to switch from Zyprexa to haloperidol with possibility of starting LAI prior to discharge.  Boardinghouse is still current plan upon discharge. DISCONTINUE ZYPREXA due to lack of efficacy START Haloperidol 5 mg bid for psychotic symptoms Titrate to 10 mg bid on 06/22/22 Cogentin 1 mg bid prn for EPS Metabolic profile and EKG monitoring obtained while on an atypical antipsychotic (BMI 36.83 Lipid Panel nonfasting: LDL 98, cholesterol 186, triglycerides 272, HDL 34 (06/13/2022), HbgA1c: ordered). QTc: 450 (06/14/2022)) Agitation protocol ordered PRN           3. Medical  Issues Being Addressed:  #Elevated creatinine Creatinine 1.33 (appears chronic compared to labs 11-12 months ago when creatinine was 1.35-1.46) Rechecking BMP when he will cooperate for labs Encourage p.o. fluids   #Prediabetes A1c 5.8 per care everywhere  Obtain A1c while on atypical antipsychotic when he will cooperate for labs   #Hypercholesterolemia Continue home atorvastatin 20 mg qHS   #  Incomplete right bundle branch block # OSA QRS duration 100, could be normal variant. Does have h/o OSA, would benefit from CPAP. Saw sleep clinic in June 2023.  Follow-up with PCP Would benefit from resuming sleep clinic f/u for CPAP   #Tobacco Use Disorder #Cannabis use disorder # r/o ETOH use disorder             -- On CIWA protocol with PRN Ativan for scores >10, MVI and thiamine oral replacement (recent scores 0,0) -- Nicotine patch 21mg /24 hours ordered             -- Smoking cessation encouraged and illicit drug use discouraged   # Seasonal allergies # Atopic dermatitis Continued home singulare   #Hypertension, stable 125/86 Will continue Clonidine PRN for SBP >160 or DBP >110 and monitor Continue Amlodipine 10 mg as blood pressure continues to be elevated   4. Discharge Planning:  -- Social work and case management to assist with discharge planning and identification of hospital follow-up needs prior to discharge             -- Estimated LOS: 7-10 days             -- Discharge Concerns: Need to establish a safety plan; Medication compliance and effectiveness             -- Discharge Goals: Return home with outpatient referrals for mental health follow-up including medication management/psychotherapy Patient unable to return home to parents - mother looking into boarding house options Patient has been served 50b since [place] admission   France Ravens, MD 06/20/2022, 9:42 AM

## 2022-06-20 NOTE — Progress Notes (Signed)
   06/20/22 0545  Sleep  Number of Hours 7

## 2022-06-20 NOTE — Group Note (Signed)
  [facility] LCSW Group Therapy Note  Date/Time:  06/20/2022   Type of Therapy and Topic:  Group Therapy:  Feelings About Hospitalization  Participation Level:  Did Not Attend   Description of Group This process group involved patients discussing their feelings related to being hospitalized, as well as the benefits they see to being in the hospital.  These feelings and benefits were itemized.  The group then brainstormed specific ways in which they could seek those same benefits when they discharge and return home.  Therapeutic Goals Patient will identify and describe positive and negative feelings related to hospitalization Patient will verbalize benefits of hospitalization to themselves personally Patients will brainstorm together ways they can obtain similar benefits in the outpatient setting, identify barriers to wellness and possible solutions  Summary of Patient Progress:  The patient did not attend group. Therapeutic Modalities Cognitive Behavioral Therapy Motivational [place], [place] 06/20/2022, 11:52 AM

## 2022-06-20 NOTE — Progress Notes (Signed)
Adult Psychoeducational Group Note  Date:  06/20/2022 Time:  10:05 PM  Group Topic/Focus:  Wrap-Up Group:   The focus of this group is to help patients review their daily goal of treatment and discuss progress on daily workbooks.  Participation Level:  Did Not Attend  Participation Quality:   Did Not Attend  Affect:   Did Not Attend  Cognitive:   Did Not Attend  Insight: None  Engagement in Group:   Did Not Attend  Modes of Intervention:   Did Not Attend  Additional Comments:  Pt was encouraged to attend wrap up group but did not attend.  [name] 06/20/2022, 10:05 PM

## 2022-06-20 NOTE — Progress Notes (Addendum)
   06/20/22 2154  Psych Admission Type (Psych Patients Only)  Admission Status Involuntary  Psychosocial Assessment  Patient Complaints None  Eye Contact Fair  Facial Expression Flat  Affect Appropriate to circumstance  Speech Logical/coherent  Interaction Minimal  Motor Activity Slow  Appearance/Hygiene In scrubs  Behavior Characteristics Cooperative;Calm  Mood Preoccupied;Pleasant  Thought Process  Coherency Circumstantial  Content Preoccupation  Delusions None reported or observed  Perception Hallucinations  Hallucination Auditory;Visual  Judgment Impaired  Confusion None  Danger to Self  Current suicidal ideation? Denies  Danger to Others  Danger to Others None reported or observed   Progress note   D: Pt seen at med window. Pt denies SI, HI. Endorses hearing, seeing and speaking to the spirits. Pt rates pain  0/10. Pt rates anxiety  0/10 and depression  0/10. Pt has been isolative today. Has not attended groups. Takes medications without incident. Started haldol today. Denies any adverse effects at this time. Pt has flat affect and is suspicious. No behavior issues. No other complaints noted.   A: Pt provided support and encouragement. Pt given scheduled medication as prescribed. PRNs as appropriate. Q15 min checks for safety.   R: Pt safe on the unit. Will continue to monitor.

## 2022-06-20 NOTE — Progress Notes (Signed)
   06/20/22 0916  Psych Admission Type (Psych Patients Only)  Admission Status Involuntary  Psychosocial Assessment  Patient Complaints None  Eye Contact Fair  Facial Expression Flat  Affect Appropriate to circumstance  Speech Logical/coherent  Interaction Minimal  Motor Activity Other (Comment) (WNL)  Appearance/Hygiene Unremarkable  Behavior Characteristics Cooperative;Calm  Mood Preoccupied;Pleasant  Thought Process  Coherency Circumstantial  Content Preoccupation  Delusions None reported or observed  Perception Hallucinations  Hallucination Auditory;Visual  Judgment Impaired  Confusion None  Danger to Self  Current suicidal ideation? Denies  Danger to Others  Danger to Others None reported or observed

## 2022-06-21 DIAGNOSIS — F250 Schizoaffective disorder, bipolar type: Secondary | ICD-10-CM | POA: Diagnosis not present

## 2022-06-21 MED ORDER — HALOPERIDOL 5 MG PO TABS
10.0000 mg | ORAL_TABLET | Freq: Two times a day (BID) | ORAL | Status: DC
  Administered 2022-06-21 – 2022-06-25 (×8): 10 mg via ORAL
  Filled 2022-06-21 (×10): qty 2

## 2022-06-21 NOTE — Group Note (Signed)
LCSW Group Therapy   Due to there being only one CSW across both adult and C/A unit today and unit needs, group was unable to be held for 500 hall on 06/21/2022. Patients were offered to meet with CSW as needed.  Gregory Saunders LCSWA  11:32 AM 

## 2022-06-21 NOTE — Progress Notes (Signed)
Date: 06/21/2022 Time: 6:30PM  Group Topic/Focus:  The focus of this group was to "break the ice" by throwing a ball around with different questions about the patient. The patient was engaged and identified different areas of strengths and weaknesses in themselves. They also identified coping skills.   Participation level: Did not attend  Participation quality: Did not attend  Affect: Did not attend  Cognitive: Did not attend  Insight: Did not attend  Engagement in group: Did not attend  Modes of Intervention: Did not attend  Adlee Paar  RN 06/21/22 6:30PM 

## 2022-06-21 NOTE — Progress Notes (Signed)
Date:  06/21/2022  Time:  9:00AM   Group Topic/Focus:   Orientation:   The focus of this group is to educate the patient on the purpose and policies of crisis stabilization and provide a format to answer questions about their admission.  The group details unit policies and expectations of patients while admitted.       Participation Level:  N/A   Participation Quality:  N/A   Affect: N/A   Cognitive:  N/A   Insight: N/A   Engagement in Group:  Did not participate   Modes of Intervention:  Did not participate   Additional Comments:      Luanna Salk RN 06/21/2022, 9:30 AM

## 2022-06-21 NOTE — Progress Notes (Signed)
[facility] MD Progress Note  06/21/2022 10:36 AM Gregory Saunders  MRN:  [id number] Principal Problem: Schizoaffective disorder, bipolar type ([place]) Diagnosis: Principal Problem:   Schizoaffective disorder, bipolar type ([place]) Active Problems:   Cannabis use disorder   OSA (obstructive sleep apnea)   Prediabetes   Tobacco use disorder   Reason for Admission: Worsening psychosis   Subjective:  Patient was seen and assessed at bedside.  Patient continues to be flat and withdrawn.  Occasionally was not responding to internal stimuli during assessment.  Patient calm and cooperative throughout assessment.  Patient still endorsing AVH related to "spiritual world" and keeping to himself in his room and was heard responding to internal stimuli.  Patient has no concerns and does continue to prefer if he was to be accepted to a boardinghouse.  Patient has no concerns about medication adjustments.  Reports no somatic complaints from medications.  Denies mood or anxiety symptoms. Continues to have fixed delusion of being born as a vampire and is also a voodoo doll.  Patient continues to be amenable to discharging to homeless shelter at time of discharge but expresses preference to going to a boardinghouse.  Discussed that we would need to get lab draws prior to discharge patient seemed to understand this but unclear whether patient will continue to refuse it.  Objective:  Chart Review Past 24 hours of patient's chart was reviewed.  Patient is compliant with scheduled meds. Required Agitation PRNs: none Per RN notes, cooperative but internally preoccupied Patient slept, 7 hours  Total Time Spent in Direct Patient Care:  I personally spent 30 minutes on the unit in direct patient care. The direct patient care time included face-to-face time with the patient, reviewing the patient's chart, communicating with other professionals, and coordinating care. Greater than 50% of this time was spent in counseling  or coordinating care with the patient regarding goals of hospitalization, psycho-education, and discharge planning needs.   Past Psychiatric History: see H&P  Past Medical History:  Past Medical History:  Diagnosis Date   Cannabis use disorder 10/16/2020   Schizophrenia ([place])    Strain of right Achilles tendon 04/07/2019   Tobacco use disorder 06/14/2022    Past Surgical History:  Procedure Laterality Date   stye removal     WISDOM TOOTH EXTRACTION     Family History: History reviewed. No pertinent family history. Family Psychiatric  History: see H&P Social History:  Social History   Substance and Sexual Activity  Alcohol Use Never     Social History   Substance and Sexual Activity  Drug Use Not Currently   Types: Marijuana    Social History   Socioeconomic History   Marital status: Single    Spouse name: Not on file   Number of children: Not on file   Years of education: Not on file   Highest education level: Not on file  Occupational History   Not on file  Tobacco Use   Smoking status: Every Day    Packs/day: 0.50    Types: Cigarettes   Smokeless tobacco: Never  Vaping Use   Vaping Use: Every day   Substances: CBD  Substance and Sexual Activity   Alcohol use: Never   Drug use: Not Currently    Types: Marijuana   Sexual activity: Not on file  Other Topics Concern   Not on file  Social History Narrative   Not on file   Social Determinants of Health   Financial Resource Strain: Not on file  Food Insecurity: Unknown (06/14/2022)   Hunger Vital Sign    Worried About Running Out of Food in the Last Year: Patient refused    Ran Out of Food in the Last Year: Patient refused  Transportation Needs: Unknown (06/14/2022)   PRAPARE - Administrator, Civil Service (Medical): Patient refused    Lack of Transportation (Non-Medical): Patient refused  Physical Activity: Not on file  Stress: Not on file  Social Connections: Not on file    Current  Medications: Current Facility-Administered Medications  Medication Dose Route Frequency Provider Last Rate Last Admin   acetaminophen (TYLENOL) tablet 650 mg  650 mg Oral Q6H PRN Ajibola, Ene A, NP       alum & mag hydroxide-simeth (MAALOX/MYLANTA) 200-200-20 MG/5ML suspension 30 mL  30 mL Oral Q4H PRN Ajibola, Ene A, NP       amLODipine (NORVASC) tablet 10 mg  10 mg Oral Daily Park Pope, MD   10 mg at 06/21/22 4097   atorvastatin (LIPITOR) tablet 20 mg  20 mg Oral QHS Princess Bruins, DO   20 mg at 06/20/22 2051   benztropine (COGENTIN) tablet 1 mg  1 mg Oral BID PRN Park Pope, MD       haloperidol lactate (HALDOL) injection 5 mg  5 mg Intramuscular Q6H PRN Park Pope, MD       And   LORazepam (ATIVAN) injection 2 mg  2 mg Intramuscular Q6H PRN Park Pope, MD       And   diphenhydrAMINE (BENADRYL) injection 50 mg  50 mg Intravenous Q6H PRN Park Pope, MD       docusate sodium (COLACE) capsule 100 mg  100 mg Oral Daily PRN Mason Jim, Amy E, MD       haloperidol (HALDOL) tablet 5 mg  5 mg Oral BID Park Pope, MD   5 mg at 06/21/22 3532   haloperidol (HALDOL) tablet 5 mg  5 mg Oral Q8H PRN Park Pope, MD       And   LORazepam (ATIVAN) tablet 1 mg  1 mg Oral Q8H PRN Park Pope, MD       magnesium hydroxide (MILK OF MAGNESIA) suspension 30 mL  30 mL Oral Daily PRN Ajibola, Ene A, NP       montelukast (SINGULAIR) tablet 10 mg  10 mg Oral QHS Princess Bruins, DO   10 mg at 06/20/22 2051   multivitamin with minerals tablet 1 tablet  1 tablet Oral Daily Comer Locket, MD   1 tablet at 06/21/22 9924   thiamine (Vitamin B-1) tablet 100 mg  100 mg Oral Daily Bartholomew Crews E, MD   100 mg at 06/21/22 2683   traZODone (DESYREL) tablet 50 mg  50 mg Oral QHS PRN Comer Locket, MD   50 mg at 06/16/22 2100    Lab Results:  No results found for this or any previous visit (from the past 24 hour(s)).  Blood Alcohol level:  Lab Results  Component Value Date   ETH <10 06/08/2022   ETH <10 06/20/2021     Metabolic Disorder Labs: No results found for: "HGBA1C", "MPG" No results found for: "PROLACTIN" Lab Results  Component Value Date   CHOL 186 06/13/2022   TRIG 272 (H) 06/13/2022   HDL 34 (L) 06/13/2022   CHOLHDL 5.5 06/13/2022   VLDL 54 (H) 06/13/2022   LDLCALC 98 06/13/2022    Physical Findings: CIWA:  CIWA-Ar Total: 0  Musculoskeletal: Strength & Muscle Tone: within normal  limits Gait & Station: normal  Psychiatric Specialty Exam:  Presentation  General Appearance: Appropriate for Environment; Casual; Fairly Groomed   Eye Contact:Fair   Speech:Clear and Coherent; Normal Rate   Speech Volume:Normal   Handedness:Right    Mood and Affect  Mood: "fine"   Affect:flat    Thought Process  Thought Processes:Disorganized   Descriptions of Associations:Tangential   Orientation:Oriented to month, year, and city, not situation   Thought Content:Paranoid and grandiose delusions; illogical belief he is a voodoo doll and vampire; reports AVH; denies ideas of reference; has belief in telekinesis; denies SI or HI   History of Schizophrenia/Schizoaffective disorder:Yes   Duration of Psychotic Symptoms:Greater than six months   Hallucinations:AVH - seeing dead people and AH from spirit world   Suicidal Thoughts:denies  Homicidal Thoughts:denies   Sensorium  Memory: Limited secondary to psychosis   Judgment:Impaired   Insight:None    Executive Functions  Concentration:Fair   Attention Span:Fair  Recall:not formally tested   [employer] of Knowledge:Limited    Language:Fair    Psychomotor Activity  Psychomotor Activity: resting in bed - uncooperative for AIMS testing; no akathesias or tremors noted   Assets  Assets:Communication Skills; Desire for Improvement  Physical Exam: Review of Systems  Respiratory:  Negative for shortness of breath.   Cardiovascular:  Negative for chest pain.  Gastrointestinal:  Negative for  abdominal pain, constipation, diarrhea, heartburn, nausea and vomiting.  Neurological:  Negative for headaches.   Blood pressure 137/84, pulse 92, temperature 98 F (36.7 C), temperature source Oral, resp. rate 16, height 6\' 2"  (1.88 m), weight 130.2 kg, SpO2 98 %. Body mass index is 36.85 kg/m.   ASSESSMENT AND PLAN CLARION MOONEYHAN is a 26 y.o., male with PMH significant for schizoaffective disorder-bipolar type, cannabis use disorder, tobacco use disorder, who presented to [facility] (06/08/2022) via parents for assaulting foster parents and bizarre behaviors, then admitted to [facility] (06/13/2022) Involuntary treatment of acute psychosis in the setting of medication nonadherence and THC use. This is hospitalization day 8.  PLAN Safety and Monitoring: Involuntary admission to inpatient psychiatric unit for safety, stabilization and treatment Daily contact with patient to assess and evaluate symptoms and progress in treatment Patient's case to be discussed in multi-disciplinary team meeting Observation Level : q15 minute checks Vital signs: q12 hours Precautions: suicide, elopement, and assault  2. Psychiatric Diagnoses and Treatment:  # Schizoaffective, bipolar type Patient continues to be psychotic, paranoid, responding to internal stimuli.  Plan to switch from Zyprexa to haloperidol with possibility of starting LAI prior to discharge.  Boardinghouse is still current plan upon discharge. INCREASE Haloperidol to 10 mg bid for continued psychotic symptoms Cogentin 1 mg bid prn for EPS Metabolic profile and EKG monitoring obtained while on an atypical antipsychotic (BMI 36.83 Lipid Panel nonfasting: LDL 98, cholesterol 186, triglycerides 272, HDL 34 (06/13/2022), HbgA1c: ordered). QTc: 450 (06/14/2022)) Agitation protocol ordered PRN           3. Medical Issues Being Addressed:  #Elevated creatinine Creatinine 1.33 (appears chronic compared to labs 11-12 months ago  when creatinine was 1.35-1.46) Rechecking BMP when he will cooperate for labs Encourage p.o. fluids   #Prediabetes A1c 5.8 per care everywhere  Obtain A1c while on atypical antipsychotic when he will cooperate for labs   #Hypercholesterolemia Continue home atorvastatin 20 mg qHS   # Incomplete right bundle branch block # OSA QRS duration 100, could be normal variant. Does have h/o OSA, would benefit from CPAP. Saw  sleep clinic in June 2023.  Follow-up with PCP Would benefit from resuming sleep clinic f/u for CPAP   #Tobacco Use Disorder #Cannabis use disorder # r/o ETOH use disorder             -- On CIWA protocol with PRN Ativan for scores >10, MVI and thiamine oral replacement (recent scores 0,0) -- Nicotine patch 21mg /24 hours ordered             -- Smoking cessation encouraged and illicit drug use discouraged   # Seasonal allergies # Atopic dermatitis Continued home singulare   #Hypertension, stable 137/84 Will continue Clonidine PRN for SBP >160 or DBP >110 and monitor Continue Amlodipine 10 mg as blood pressure continues to be elevated   4. Discharge Planning:  -- Social work and case management to assist with discharge planning and identification of hospital follow-up needs prior to discharge             -- Estimated LOS: 7-10 days             -- Discharge Concerns: Need to establish a safety plan; Medication compliance and effectiveness             -- Discharge Goals: Return home with outpatient referrals for mental health follow-up including medication management/psychotherapy Patient unable to return home to parents - mother looking into boarding house options Patient has been served 50b since [facility] admission   03-16-1982, MD 06/21/2022, 10:36 AM

## 2022-06-21 NOTE — Progress Notes (Addendum)
Adult Psychoeducational Group Note  Date:  06/21/2022 Time:  8:49 PM  Group Topic/Focus:  Wrap-Up Group:   The focus of this group is to help patients review their daily goal of treatment and discuss progress on daily workbooks.  Participation Level:  Did Not Attend  Participation Quality:   Did Not Attend  Affect:   Did Not Attend  Cognitive:   Did Not Attend  Insight: None  Engagement in Group:   Did Not Attend  Modes of Intervention:   Did Not Attend  Additional Comments:  Pt was encouraged to attend wrap up group but did not attend.  [name] 06/21/2022, 8:49 PM

## 2022-06-21 NOTE — Plan of Care (Signed)
Alert, verbal and able to make needs known. Patient took his medications this morning with minimal prompting. He denies any pain or anxiety today. He denies SI/HI/AVH but appears to be responding to internal stimuli. He is seen looking over his shoulder often staring off when asked questions. He denies pain and has no physical complaints today.      Problem: Education: Goal: Mental status will improve Outcome: Progressing   Problem: Activity: Goal: Interest or engagement in activities will improve Outcome: Progressing Goal: Sleeping patterns will improve Outcome: Progressing   Problem: Coping: Goal: Ability to verbalize frustrations and anger appropriately will improve Outcome: Progressing Goal: Ability to demonstrate self-control will improve Outcome: Progressing   Problem: Physical Regulation: Goal: Ability to maintain clinical measurements within normal limits will improve Outcome: Progressing

## 2022-06-22 DIAGNOSIS — F250 Schizoaffective disorder, bipolar type: Secondary | ICD-10-CM | POA: Diagnosis not present

## 2022-06-22 NOTE — Progress Notes (Signed)
   06/21/22 2000  Psych Admission Type (Psych Patients Only)  Admission Status Involuntary  Psychosocial Assessment  Patient Complaints Isolation;Anxiety;Crying spells  Eye Contact Fair  Facial Expression Animated  Affect Blunted;Depressed  Speech Pressured  Interaction Assertive  Motor Activity Slow  Appearance/Hygiene Body odor  Behavior Characteristics Cooperative;Appropriate to situation  Mood Depressed  Thought Process  Coherency WDL  Content Preoccupation  Delusions None reported or observed  Perception Hallucinations  Hallucination Visual;Auditory  Judgment Impaired  Confusion None  Danger to Self  Current suicidal ideation? Denies  Danger to Others  Danger to Others None reported or observed
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
## 2022-06-22 NOTE — Progress Notes (Signed)
   06/22/22 2000  Psych Admission Type (Psych Patients Only)  Admission Status Involuntary  Psychosocial Assessment  Patient Complaints Depression;Anxiety  Eye Contact Fair  Facial Expression Flat  Affect Depressed;Blunted  Speech Logical/coherent  Interaction Assertive;Forwards little  Motor Activity Slow  Appearance/Hygiene Disheveled  Behavior Characteristics Guarded  Mood Depressed  Aggressive Behavior  Effect No apparent injury  Thought Process  Coherency WDL  Content Preoccupation  Delusions None reported or observed  Perception Hallucinations  Hallucination Auditory;Visual  Judgment Impaired  Confusion None  Danger to Self  Current suicidal ideation? Denies  Danger to Others  Danger to Others None reported or observed

## 2022-06-22 NOTE — Group Note (Signed)
  [facility] LCSW Group Therapy Note  Date/Time:  06/22/2022 10:00am-11:00am or 11:00am-12:00pm  Type of Therapy and Topic:  Group Therapy:  Self-Care after Hospitalization  Participation Level:  Active   Description of Group This process group involved patients discussing how they plan to take care of themselves in a better manner when they get home from the hospital.  The group started with patients listing one healthy self-care they hope to engage in at discharge that they did not use prior to admission.  We discussed a variety of other means of self-care which had a large range from hygiene activities to eating to setting boundaries to participating in peer support groups.  The primary focus by CSW was to point out commonalities.  When there were participants who stated everyone else can get help, but they themselves are "beyond help," this was discussed in detail and this belief was gently challenged.  Finally, it was announced that immediately following group was to be "Hygiene Hour" where everyone would go to their rooms and take care of their personal hygiene.  This was met with wide acceptance.  Therapeutic Goals Patient will identify and describe one self-care activity to deliberately plan to use upon hospital discharge Patient will participate in generating additional ideas about healthy self-care options when they return to the community Patients will be supportive of one another and receive support from others Patients will be challenged to realize that they are not "beyond help" any more than other participants in the room are  Summary of Patient Progress:  Patient participated appropriately in group and had good insight into group topic.  Patient states that his motivation to prioritize mental health is for his kids.  Patient was able to put together a wellness plan to take care of himself if he needed to intervene with his mental health.   Therapeutic Modalities Brief  Solution-Focused Therapy Psychoeducation  Doyl Bitting LCSW 06/22/2022, 2:22 PM

## 2022-06-22 NOTE — Progress Notes (Signed)
Adult Psychoeducational Group Note  Date:  06/22/2022 Time:  8:30 PM  Group Topic/Focus:  Wrap-Up Group:   The focus of this group is to help patients review their daily goal of treatment and discuss progress on daily workbooks.  Participation Level:  Did Not Attend  Participation Quality:   Did Not Attend  Affect:   Did Not attend  Cognitive:   Did Not Attend  Insight: None  Engagement in Group:   Did Not Attend  Modes of Intervention:   Did Not Attend  Additional Comments:  Pt was encouraged to attend wrap up group but did not attend.  [name] 06/22/2022, 8:30 PM

## 2022-06-22 NOTE — Progress Notes (Signed)
Nursing Note: 0700-1900  D:  Pt present with depressed mood and flat/blunted affect. He is withdrawn/isolative and mostly stays in his room with door closed, upon opening door for safety checks, pt almost always pacing and appears to be responding to stimuli.  Pt irritable this am, not willing to answer questions but did take meds as prescribed.   A:  Pt. encouraged to verbalize needs and concerns, active listening and support provided.  Continued Q 15 minute safety checks.    R:  Pt. irritable and isolative, respectful this afternoon, when asked if he wanted to go outside, "no thank you ma'am."  Pt denies A/V hallucinations though observed to be responding to stimuli often times in his room.  Pt is able to verbally contract for safety, denies SI/HI.   06/22/22 0800  Psych Admission Type (Psych Patients Only)  Admission Status Involuntary  Psychosocial Assessment  Patient Complaints Apathy;Depression;Irritability;Isolation  Eye Contact Fair  Facial Expression Flat  Affect Blunted;Depressed  Speech Logical/coherent  Interaction Assertive;Defensive  Motor Activity Slow  Appearance/Hygiene Body odor  Behavior Characteristics Guarded;Irritable  Mood Depressed;Preoccupied  Thought Process  Coherency WDL  Content Preoccupation  Delusions None reported or observed  Perception Hallucinations  Hallucination Visual;Auditory  Judgment Impaired  Confusion None  Danger to Self  Current suicidal ideation? Denies  Danger to Others  Danger to Others None reported or observed

## 2022-06-23 DIAGNOSIS — F250 Schizoaffective disorder, bipolar type: Secondary | ICD-10-CM | POA: Diagnosis not present

## 2022-06-23 MED ORDER — BENZTROPINE MESYLATE 0.5 MG PO TABS
0.5000 mg | ORAL_TABLET | Freq: Two times a day (BID) | ORAL | Status: DC
  Administered 2022-06-23 – 2022-07-09 (×32): 0.5 mg via ORAL
  Filled 2022-06-23 (×9): qty 1
  Filled 2022-06-23: qty 14
  Filled 2022-06-23 (×3): qty 1
  Filled 2022-06-23 (×2): qty 14
  Filled 2022-06-23 (×5): qty 1
  Filled 2022-06-23: qty 14
  Filled 2022-06-23 (×16): qty 1

## 2022-06-23 NOTE — Progress Notes (Signed)
Adult Psychoeducational Group Note  Date:  06/23/2022 Time:  9:35 PM  Group Topic/Focus:  Wrap-Up Group:   The focus of this group is to help patients review their daily goal of treatment and discuss progress on daily workbooks.  Participation Level:  Did Not Attend  Participation Quality:  Did Not Attend  Affect:   Did Not attend  Cognitive:   Did Not Attend  Insight: None  Engagement in Group:   Did Not attend  Modes of Intervention:  Did Not Attend  Additional Comments:  Pt was encouraged to attend wrap up group but did not attend.  [name] 06/23/2022, 9:35 PM

## 2022-06-23 NOTE — Progress Notes (Signed)
   06/23/22 2030  Psych Admission Type (Psych Patients Only)  Admission Status Involuntary  Psychosocial Assessment  Patient Complaints Sleep disturbance  Eye Contact Fair  Facial Expression Flat  Affect Flat  Speech Logical/coherent  Interaction Assertive  Motor Activity Slow  Appearance/Hygiene Body odor  Behavior Characteristics Cooperative  Mood Preoccupied  Thought Process  Coherency WDL  Content Preoccupation  Delusions None reported or observed  Perception Hallucinations  Hallucination Auditory;Visual  Judgment Impaired  Confusion None  Danger to Self  Current suicidal ideation? Denies  Danger to Others  Danger to Others None reported or observed   Progress note   D: Pt seen in his room. Pt denies SI, HI. Pt still endorsing AVH as talking and seeing spirits. Pt rates pain  0/10. Pt rates anxiety  0/10 and depression  0/10. Pt asking for medication for sleep tonight. Pt is minimal. No other complaints noted at this time.  A: Pt provided support and encouragement. Pt given scheduled medication as prescribed. PRNs as appropriate. Q15 min checks for safety.   R: Pt safe on the unit. Will continue to monitor.

## 2022-06-23 NOTE — Progress Notes (Signed)
   06/23/22 0515  Sleep  Number of Hours 9.25
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
## 2022-06-23 NOTE — Group Note (Signed)
Date:  06/23/2022 Time:  11:02 AM  Group Topic/Focus:  Goals Group:   The focus of this group is to help patients establish daily goals to achieve during treatment and discuss how the patient can incorporate goal setting into their daily lives to aide in recovery. Orientation:   The focus of this group is to educate the patient on the purpose and policies of crisis stabilization and provide a format to answer questions about their admission.  The group details unit policies and expectations of patients while admitted.    Participation Level:  Did Not Attend  Participation Quality:    Affect:    Cognitive:    Insight:   Engagement in Group:   Modes of Intervention:    Additional Comments:  Pt did not attend group after encourage  Garvin Fila 06/23/2022, 11:02 AM

## 2022-06-23 NOTE — [facility] Counselor (Addendum)
[facility] LCSW Progress Note   06/23/2022    2:05 PM  AYREN ZUMBRO   [id number]   Type of Contact and Topic:  Disposition and Guardianship  CSW spoke with [facility], Jaynee Eagles, who is willing to petition for state guardianship with doctors recommendation.  Sharyn Lull requested for progress notes and evaluations where doctors are recommending for guardianship.  Jaynee Eagles also discussed how she would need to contact [facility] for assistance with placement if they are awarded guardianship.  CSW agreed to send progress notes and to keep in contact with DSS social worker for ongoing support to assist patient.   [place] referral completed on 06/22/2022.    Signed:  Riki Altes MSW, LCSW, LCAS 06/23/2022 2:05 PM
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
## 2022-06-23 NOTE — [facility] Counselor (Signed)
[facility] LCSW Progress Note   06/23/2022    3:40 PM  Gregory Saunders   [id number]   Type of Contact and Topic:  Placement and Collateral   CSW spoke with patient mother, Granite Godman, who reports that she has been in contact with [facility], [phone], with [facility] regarding supportive living. Mother reports that patient would need to participate in a screener to get accepted there.  CSW educated that patient continues to be his own guardian and would have to agree to this placement but at this time is declining.  Patient currently open to only boarding house or shelter.  CSW discussed doctors recommendations of guardianship and contacting Sharyn Lull, caseworker from [employer], to get process started to reinstate guardianship.  CSW also educated that at this time there is no discharge date but that if patient were to clear up and doctors psychiatrically clear him, we may need to look into a hotel or other placement since at this time he is his own guardian.  Mother demonstrated understanding.     Signed:  Riki Altes, MSW, LCSW LCAS 06/23/2022 3:40 PM

## 2022-06-24 ENCOUNTER — Encounter (HOSPITAL_COMMUNITY): Payer: Self-pay

## 2022-06-24 DIAGNOSIS — F250 Schizoaffective disorder, bipolar type: Secondary | ICD-10-CM | POA: Diagnosis not present

## 2022-06-24 LAB — HEMOGLOBIN A1C
Hgb A1c MFr Bld: 5.8 % — ABNORMAL HIGH (ref 4.8–5.6)
Mean Plasma Glucose: 119.76 mg/dL

## 2022-06-24 LAB — BASIC METABOLIC PANEL
Anion gap: 8 (ref 5–15)
BUN: 12 mg/dL (ref 6–20)
CO2: 27 mmol/L (ref 22–32)
Calcium: 9.8 mg/dL (ref 8.9–10.3)
Chloride: 104 mmol/L (ref 98–111)
Creatinine, Ser: 1.2 mg/dL (ref 0.61–1.24)
GFR, Estimated: >60 mL/min (ref >60)
Glucose, Bld: 92 mg/dL (ref 70–99)
Potassium: 3.9 mmol/L (ref 3.5–5.1)
Sodium: 139 mmol/L (ref 135–145)

## 2022-06-24 NOTE — Progress Notes (Signed)
[facility] MD Progress Note  06/24/2022 10:10 AM Gregory Saunders  MRN:  [id number] Principal Problem: Schizoaffective disorder, bipolar type (HCC) Diagnosis: Principal Problem:   Schizoaffective disorder, bipolar type (HCC) Active Problems:   Cannabis use disorder   OSA (obstructive sleep apnea)   Prediabetes   Tobacco use disorder   Reason for Admission: Worsening psychosis   Subjective:  Patient was seen and assessed at bedside.  Patient continues to be flat and withdrawn. Patient minimally attended group yesterday. Patient still endorsing AVH related to "spiritual world" and is not concerned whether other people believe it or not. Continues to endorse belief in telekinesis, VH of dead people and AH of Indians.He states we do not have to believe him if we don't want to. Patient has no concerns and does continue to prefer if he was to be accepted to a boardinghouse.  Patient has no concerns about medication adjustments. Patient is agreeable to scheduled cogentin 0.5 mg bid for EPS prophylaxis.  Reports no somatic complaints from medications.  Denies mood or anxiety symptoms.  Patient continues to be amenable to discharging to homeless shelter at time of discharge but expresses preference to going to a boardinghouse.  Objective:  Chart Review Past 24 hours of patient's chart was reviewed.  Patient is compliant with scheduled meds. Required Agitation PRNs: none Per RN notes, cooperative but internally preoccupied Patient slept, 7 hours  Total Time Spent in Direct Patient Care:  I personally spent 30 minutes on the unit in direct patient care. The direct patient care time included face-to-face time with the patient, reviewing the patient's chart, communicating with other professionals, and coordinating care. Greater than 50% of this time was spent in counseling or coordinating care with the patient regarding goals of hospitalization, psycho-education, and discharge planning needs.   Past  Psychiatric History: see H&P  Past Medical History:  Past Medical History:  Diagnosis Date   Cannabis use disorder 10/16/2020   Schizophrenia (HCC)    Strain of right Achilles tendon 04/07/2019   Tobacco use disorder 06/14/2022    Past Surgical History:  Procedure Laterality Date   stye removal     WISDOM TOOTH EXTRACTION     Family History: History reviewed. No pertinent family history. Family Psychiatric  History: see H&P Social History:  Social History   Substance and Sexual Activity  Alcohol Use Never     Social History   Substance and Sexual Activity  Drug Use Not Currently   Types: Marijuana    Social History   Socioeconomic History   Marital status: Single    Spouse name: Not on file   Number of children: Not on file   Years of education: Not on file   Highest education level: Not on file  Occupational History   Not on file  Tobacco Use   Smoking status: Every Day    Packs/day: 0.50    Types: Cigarettes   Smokeless tobacco: Never  Vaping Use   Vaping Use: Every day   Substances: CBD  Substance and Sexual Activity   Alcohol use: Never   Drug use: Not Currently    Types: Marijuana   Sexual activity: Not on file  Other Topics Concern   Not on file  Social History Narrative   Not on file   Social Determinants of Health   Financial Resource Strain: Not on file  Food Insecurity: Unknown (06/14/2022)   Hunger Vital Sign    Worried About Running Out of Food in the Last Year:  Patient refused    Ran Out of Food in the Last Year: Patient refused  Transportation Needs: Unknown (06/14/2022)   PRAPARE - Administrator, Civil Service (Medical): Patient refused    Lack of Transportation (Non-Medical): Patient refused  Physical Activity: Not on file  Stress: Not on file  Social Connections: Not on file    Current Medications: Current Facility-Administered Medications  Medication Dose Route Frequency Provider Last Rate Last Admin    acetaminophen (TYLENOL) tablet 650 mg  650 mg Oral Q6H PRN Ajibola, Ene A, NP       alum & mag hydroxide-simeth (MAALOX/MYLANTA) 200-200-20 MG/5ML suspension 30 mL  30 mL Oral Q4H PRN Ajibola, Ene A, NP       amLODipine (NORVASC) tablet 10 mg  10 mg Oral Daily Park Pope, MD   10 mg at 06/24/22 0818   atorvastatin (LIPITOR) tablet 20 mg  20 mg Oral QHS Princess Bruins, DO   20 mg at 06/23/22 2029   benztropine (COGENTIN) tablet 0.5 mg  0.5 mg Oral BID Park Pope, MD   0.5 mg at 06/24/22 0818   haloperidol lactate (HALDOL) injection 5 mg  5 mg Intramuscular Q6H PRN Park Pope, MD       And   LORazepam (ATIVAN) injection 2 mg  2 mg Intramuscular Q6H PRN Park Pope, MD       And   diphenhydrAMINE (BENADRYL) injection 50 mg  50 mg Intravenous Q6H PRN Park Pope, MD       docusate sodium (COLACE) capsule 100 mg  100 mg Oral Daily PRN Comer Locket, MD       haloperidol (HALDOL) tablet 10 mg  10 mg Oral BID Park Pope, MD   10 mg at 06/24/22 0817   haloperidol (HALDOL) tablet 5 mg  5 mg Oral Q8H PRN Park Pope, MD       And   LORazepam (ATIVAN) tablet 1 mg  1 mg Oral Q8H PRN Park Pope, MD       magnesium hydroxide (MILK OF MAGNESIA) suspension 30 mL  30 mL Oral Daily PRN Ajibola, Ene A, NP       montelukast (SINGULAIR) tablet 10 mg  10 mg Oral QHS Princess Bruins, DO   10 mg at 06/23/22 2029   multivitamin with minerals tablet 1 tablet  1 tablet Oral Daily Comer Locket, MD   1 tablet at 06/24/22 0818   thiamine (Vitamin B-1) tablet 100 mg  100 mg Oral Daily Comer Locket, MD   100 mg at 06/24/22 0817   traZODone (DESYREL) tablet 50 mg  50 mg Oral QHS PRN Comer Locket, MD   50 mg at 06/23/22 2029    Lab Results:  Results for orders placed or performed during the hospital encounter of 06/13/22 (from the past 24 hour(s))  Basic metabolic panel     Status: None   Collection Time: 06/24/22  6:39 AM  Result Value Ref Range   Sodium 139 135 - 145 mmol/L   Potassium 3.9 3.5 - 5.1 mmol/L    Chloride 104 98 - 111 mmol/L   CO2 27 22 - 32 mmol/L   Glucose, Bld 92 70 - 99 mg/dL   BUN 12 6 - 20 mg/dL   Creatinine, Ser 5.63 0.61 - 1.24 mg/dL   Calcium 9.8 8.9 - 87.5 mg/dL   GFR, Estimated >64 >33 mL/min   Anion gap 8 5 - 15  Hemoglobin A1c     Status:  Abnormal   Collection Time: 06/24/22  6:39 AM  Result Value Ref Range   Hgb A1c MFr Bld 5.8 (H) 4.8 - 5.6 %   Mean Plasma Glucose 119.76 mg/dL    Blood Alcohol level:  Lab Results  Component Value Date   ETH <10 06/08/2022   ETH <10 06/20/2021    Metabolic Disorder Labs: Lab Results  Component Value Date   HGBA1C 5.8 (H) 06/24/2022   MPG 119.76 06/24/2022   No results found for: "PROLACTIN" Lab Results  Component Value Date   CHOL 186 06/13/2022   TRIG 272 (H) 06/13/2022   HDL 34 (L) 06/13/2022   CHOLHDL 5.5 06/13/2022   VLDL 54 (H) 06/13/2022   LDLCALC 98 06/13/2022    Physical Findings: CIWA:  CIWA-Ar Total: 0  Musculoskeletal: Strength & Muscle Tone: within normal limits Gait & Station: normal  Psychiatric Specialty Exam:  Presentation  General Appearance: Appropriate for Environment; Casual; Fairly Groomed   Eye Contact:Fair   Speech:Clear and Coherent; Normal Rate   Speech Volume:Normal   Handedness:Right    Mood and Affect  Mood: "fine"- appears aloof and irritable   Affect:flat, guarded, irritable    Thought Process  Thought Processes:Disorganized   Descriptions of Associations:Tangential   Orientation:Oriented to month, year, and city, not situation   Thought Content:Paranoid and grandiose delusions; illogical belief he is a voodoo doll and vampire; reports AVH; denies ideas of reference; has belief in telekinesis; denies SI or HI   History of Schizophrenia/Schizoaffective disorder:Yes   Duration of Psychotic Symptoms:Greater than six months   Hallucinations:AVH - seeing dead people and AH from spirit world   Suicidal Thoughts:denies  Homicidal  Thoughts:denies   Sensorium  Memory: Limited secondary to psychosis   Judgment:Impaired   Insight:None    Art therapist  Concentration:Fair   Attention Span:Fair  Recall:not formally tested   [employer] of Knowledge:Limited    Language:Fair    Psychomotor Activity  Psychomotor Activity: AIMS 0, no cogwheeling, stiffness or tremor   Assets  Assets:Communication Skills; Desire for Improvement  Physical Exam: Review of Systems  Respiratory:  Negative for shortness of breath.   Cardiovascular:  Negative for chest pain.  Gastrointestinal:  Negative for abdominal pain, constipation, diarrhea, heartburn, nausea and vomiting.  Neurological:  Negative for headaches.   Blood pressure (!) 142/92, pulse (!) 115, temperature 98.1 F (36.7 C), temperature source Oral, resp. rate 16, height 6\' 2"  (1.88 m), weight 130.2 kg, SpO2 97 %. Body mass index is 36.85 kg/m.   ASSESSMENT AND PLAN IYAD DEROO is a 26 y.o., male with PMH significant for schizoaffective disorder-bipolar type, cannabis use disorder, tobacco use disorder, who presented to [facility] (06/08/2022) via parents for assaulting foster parents and bizarre behaviors, then admitted to [facility] (06/13/2022) Involuntary treatment of acute psychosis in the setting of medication nonadherence and THC use. This is hospitalization day 11.  PLAN Safety and Monitoring: Involuntary admission to inpatient psychiatric unit for safety, stabilization and treatment Daily contact with patient to assess and evaluate symptoms and progress in treatment Patient's case to be discussed in multi-disciplinary team meeting Observation Level : q15 minute checks Vital signs: q12 hours Precautions: suicide, elopement, and assault  2. Psychiatric Diagnoses and Treatment:  # Schizoaffective, bipolar type Patient continues to be psychotic and paranoid.   Continue  Haloperidol to 10 mg bid for continued psychotic  symptoms Cogentin 0.5 mg bid for EPS prophylaxis  Metabolic profile and EKG monitoring obtained while on an atypical antipsychotic (  BMI 36.83 Lipid Panel nonfasting: LDL 98, cholesterol 186, triglycerides 272, HDL 34 (06/13/2022), HbgA1c: 5.8). QTc: 450 (06/14/2022)) Agitation protocol ordered PRN [place] referral made by SW Given his lack of insight into his mental health issues, the chronic nature of his delusions and psychosis, the recent aggression with family prior to admission, and concerns for lack of medication compliance after discharge, the patient would likely benefit from a higher level of care after discharge than boarding house or shelter but is presently refusing a group home. He would likely benefit from a guardian to assist with decision making after discharge given his ongoing refractory psychosis.   3. Medical Issues Being Addressed:  #Elevated creatinine Creatinine 1.33 (appears chronic compared to labs 11-12 months ago when creatinine was 1.35-1.46) Wnl BMP Encourage p.o. fluids   #Prediabetes A1c 5.8 06/24/22   #Hypercholesterolemia Continue home atorvastatin 20 mg qHS   # Incomplete right bundle branch block # OSA QRS duration 100, could be normal variant. Does have h/o OSA, would benefit from CPAP. Saw sleep clinic in June 2023.  Follow-up with PCP Would benefit from resuming sleep clinic f/u for CPAP Repeating EKG for QTC monitoring   #Tobacco Use Disorder #Cannabis use disorder # r/o ETOH use disorder             -- CIWA discontinued based on scores -- Nicotine patch 21mg /24 hours ordered             -- Smoking cessation encouraged and illicit drug use discouraged   # Seasonal allergies # Atopic dermatitis Continued home singulare   #Hypertension, stable 142/92 Will continue Clonidine PRN for SBP >160 or DBP >110 and monitor Continue Amlodipine 10 mg as blood pressure continues to be elevated   4. Discharge Planning:  -- Social work and case management to  assist with discharge planning and identification of hospital follow-up needs prior to discharge             -- Discharge Concerns: Need to establish a safety plan; Medication compliance and effectiveness             -- Discharge Goals: Return home with outpatient referrals for mental health follow-up including medication management/psychotherapy Patient unable to return home to parents - mother looking into boarding house options Patient has been served 50b since [facility] admission   France Ravens, MD 06/24/2022, 10:10 AM

## 2022-06-24 NOTE — Progress Notes (Signed)
   06/24/22 0550  Sleep  Number of Hours 8.5

## 2022-06-24 NOTE — Group Note (Signed)
Gregory Saunders Group Therapy Note  Group Date: 06/24/2022 Start Time: 1300 End Time: 1400   Type of Therapy and Topic:  Group Therapy: Anger Cues and Responses  Participation Level:  Active   Description of Group:   In this group, patients learned how to recognize the physical, cognitive, emotional, and behavioral responses they have to anger-provoking situations.  They identified a recent time they became angry and how they reacted.  They analyzed how their reaction was possibly beneficial and how it was possibly unhelpful.  The group discussed a variety of healthier coping skills that could help with such a situation in the future.  Focus was placed on how helpful it is to recognize the underlying emotions to our anger, because working on those can lead to a more permanent solution as well as our ability to focus on the important rather than the urgent.  Therapeutic Goals: Patients will remember their last incident of anger and how they felt emotionally and physically, what their thoughts were at the time, and how they behaved. Patients will identify how their behavior at that time worked for them, as well as how it worked against them. Patients will explore possible new behaviors to use in future anger situations. Patients will learn that anger itself is normal and cannot be eliminated, and that healthier reactions can assist with resolving conflict rather than worsening situations.  Summary of Patient Progress:  Gregory Saunders was active during the group. Gregory Saunders demonstrated good insight into the subject matter, was respectful of peers, and participated throughout the entire session. Patient shared that Saunders takes walks to cope with anger.  Saunders reports that Saunders plans to cope with anger by learning how to take deep breaths and start thinking about how his anger affects others.   Therapeutic Modalities:   Cognitive Behavioral Therapy    Gregory Jepsen E Renan Danese, Gregory Saunders 06/24/2022  1:40 PM

## 2022-06-24 NOTE — BH IP Treatment Plan (Signed)
Interdisciplinary Treatment and Diagnostic Plan Update  06/24/2022 Time of Session: [place] MRN: [id number]  Principal Diagnosis: Schizoaffective disorder, bipolar type [facility])  Secondary Diagnoses: Principal Problem:   Schizoaffective disorder, bipolar type ([place]) Active Problems:   Cannabis use disorder   OSA (obstructive sleep apnea)   Prediabetes   Tobacco use disorder   Current Medications:  Current Facility-Administered Medications  Medication Dose Route Frequency Provider Last Rate Last Admin   acetaminophen (TYLENOL) tablet 650 mg  650 mg Oral Q6H PRN Ajibola, Ene A, NP       alum & mag hydroxide-simeth (MAALOX/MYLANTA) 200-200-20 MG/5ML suspension 30 mL  30 mL Oral Q4H PRN Ajibola, Ene A, NP       amLODipine (NORVASC) tablet 10 mg  10 mg Oral Daily France Ravens, MD   10 mg at 06/24/22 0818   atorvastatin (LIPITOR) tablet 20 mg  20 mg Oral QHS Merrily Brittle, DO   20 mg at 06/23/22 2029   benztropine (COGENTIN) tablet 0.5 mg  0.5 mg Oral BID France Ravens, MD   0.5 mg at 06/24/22 0818   haloperidol lactate (HALDOL) injection 5 mg  5 mg Intramuscular Q6H PRN France Ravens, MD       And   LORazepam (ATIVAN) injection 2 mg  2 mg Intramuscular Q6H PRN France Ravens, MD       And   diphenhydrAMINE (BENADRYL) injection 50 mg  50 mg Intravenous Q6H PRN France Ravens, MD       docusate sodium (COLACE) capsule 100 mg  100 mg Oral Daily PRN Harlow Asa, MD       haloperidol (HALDOL) tablet 10 mg  10 mg Oral BID France Ravens, MD   10 mg at 06/24/22 0817   haloperidol (HALDOL) tablet 5 mg  5 mg Oral Q8H PRN France Ravens, MD       And   LORazepam (ATIVAN) tablet 1 mg  1 mg Oral Q8H PRN France Ravens, MD       magnesium hydroxide (MILK OF MAGNESIA) suspension 30 mL  30 mL Oral Daily PRN Ajibola, Ene A, NP       montelukast (SINGULAIR) tablet 10 mg  10 mg Oral QHS Merrily Brittle, DO   10 mg at 06/23/22 2029   multivitamin with minerals tablet 1 tablet  1 tablet Oral Daily Harlow Asa, MD    1 tablet at 06/24/22 0818   thiamine (Vitamin B-1) tablet 100 mg  100 mg Oral Daily Nelda Marseille, Amy E, MD   100 mg at 06/24/22 0817   traZODone (DESYREL) tablet 50 mg  50 mg Oral QHS PRN Harlow Asa, MD   50 mg at 06/23/22 2029   PTA Medications: Medications Prior to Admission  Medication Sig Dispense Refill Last Dose   OLANZapine (ZYPREXA) 10 MG tablet Take 1 tablet (10 mg total) by mouth at bedtime. 30 tablet 2 Past Month   risperiDONE (RISPERDAL) 3 MG tablet Take 3 mg by mouth 2 (two) times daily.   Past Month    Patient Stressors: Marital or family conflict   Medication change or noncompliance   Occupational concerns   Substance abuse    Patient Strengths: Active sense of humor  Physical Health  Work skills   Treatment Modalities: Medication Management, Group therapy, Case management,  1 to 1 session with clinician, Psychoeducation, Recreational therapy.   Physician Treatment Plan for Primary Diagnosis: Schizoaffective disorder, bipolar type ([place]) Long Term Goal(s):     Short Term Goals:  Medication Management: Evaluate patient's response, side effects, and tolerance of medication regimen.  Therapeutic Interventions: 1 to 1 sessions, Unit Group sessions and Medication administration.  Evaluation of Outcomes: Progressing  Physician Treatment Plan for Secondary Diagnosis: Principal Problem:   Schizoaffective disorder, bipolar type ([place]) Active Problems:   Cannabis use disorder   OSA (obstructive sleep apnea)   Prediabetes   Tobacco use disorder  Long Term Goal(s):     Short Term Goals:       Medication Management: Evaluate patient's response, side effects, and tolerance of medication regimen.  Therapeutic Interventions: 1 to 1 sessions, Unit Group sessions and Medication administration.  Evaluation of Outcomes: Progressing   RN Treatment Plan for Primary Diagnosis: Schizoaffective disorder, bipolar type ([place]) Long Term Goal(s): Knowledge of disease and  therapeutic regimen to maintain health will improve  Short Term Goals: Ability to remain free from injury will improve, Ability to verbalize frustration and anger appropriately will improve, Ability to demonstrate self-control, Ability to participate in decision making will improve, Ability to verbalize feelings will improve, Ability to disclose and discuss suicidal ideas, Ability to identify and develop effective coping behaviors will improve, and Compliance with prescribed medications will improve  Medication Management: RN will administer medications as ordered by provider, will assess and evaluate patient's response and provide education to patient for prescribed medication. RN will report any adverse and/or side effects to prescribing provider.  Therapeutic Interventions: 1 on 1 counseling sessions, Psychoeducation, Medication administration, Evaluate responses to treatment, Monitor vital signs and CBGs as ordered, Perform/monitor CIWA, COWS, AIMS and Fall Risk screenings as ordered, Perform wound care treatments as ordered.  Evaluation of Outcomes: Progressing   LCSW Treatment Plan for Primary Diagnosis: Schizoaffective disorder, bipolar type ([place]) Long Term Goal(s): Safe transition to appropriate next level of care at discharge, Engage patient in therapeutic group addressing interpersonal concerns.  Short Term Goals: Engage patient in aftercare planning with referrals and resources, Increase social support, Increase ability to appropriately verbalize feelings, Increase emotional regulation, Facilitate acceptance of mental health diagnosis and concerns, Facilitate patient progression through stages of change regarding substance use diagnoses and concerns, Identify triggers associated with mental health/substance abuse issues, and Increase skills for wellness and recovery  Therapeutic Interventions: Assess for all discharge needs, 1 to 1 time with Social worker, Explore available resources and  support systems, Assess for adequacy in community support network, Educate family and significant other(s) on suicide prevention, Complete Psychosocial Assessment, Interpersonal group therapy.  Evaluation of Outcomes: Progressing   Progress in Treatment: Attending groups: Yes. Participating in groups: Yes. Taking medication as prescribed: Yes. Toleration medication: Yes. Family/Significant other contact made: Yes, individual(s) contacted:  Partick Fiola, [phone]   Patient understands diagnosis: No. Discussing patient identified problems/goals with staff: Yes. Medical problems stabilized or resolved: Yes. Denies suicidal/homicidal ideation: No. Issues/concerns per patient self-inventory: Yes. Other: none  New problem(s) identified: No, Describe:  none  New Short Term/Long Term Goal(s): Patient to work towards detox, elimination of symptoms of psychosis, medication management for mood stabilization; elimination of SI thoughts; development of comprehensive mental wellness/sobriety plan.  Patient Goals:  No additional goals identified at this time. Patient to continue to work towards original goals identified in initial treatment team meeting. CSW will remain available to patient should they voice additional treatment goals.   Discharge Plan or Barriers: No psychosocial barriers identified at this time, patient to return to place of residence when appropriate for discharge.   Reason for Continuation of Hospitalization: Medication stabilization Other; describe psychosis  Estimated Length of Stay:1-7 days   Scribe for Treatment Team: Durenda Hurt, Latanya Presser 06/24/2022 10:00 AM

## 2022-06-24 NOTE — Group Note (Signed)
Recreation Therapy Group Note   Group Topic:Team Building  Group Date: 06/24/2022 Start Time: 1020 End Time: 1055 Facilitators: Icholas Irby-McCall, LRT,CTRS Location: [address] Day[address]   Goal Area(s) Addresses:  Patient will effectively work with peer towards shared goal.  Patient will identify skills used to make activity successful.  Patient will identify how skills used during activity can be used to reach post d/c goals.   Group Description: Straw Bridge. In teams of 3-5, patients were given 15 plastic drinking straws and an equal length of masking tape. Using the materials provided, patients were instructed to build a free standing bridge-like structure to suspend an everyday item (ex: puzzle box) off of the floor or table surface. All materials were required to be used by the team in their design. LRT facilitated post-activity discussion reviewing team process. Patients were encouraged to reflect how the skills used in this activity can be generalized to daily life post discharge.    Affect/Mood: N/A   Participation Level: Did not attend    Clinical Observations/Individualized Feedback:     Plan: Continue to engage patient in RT group sessions 2-3x/week.   Dameer Speiser-McCall, LRT,CTRS 06/24/2022 12:06 PM

## 2022-06-24 NOTE — Progress Notes (Signed)
   06/24/22 2000  Psych Admission Type (Psych Patients Only)  Admission Status Involuntary  Psychosocial Assessment  Patient Complaints None  Eye Contact Brief  Facial Expression Flat  Affect Preoccupied  Speech Logical/coherent  Interaction Minimal  Motor Activity Other (Comment) (wnl)  Appearance/Hygiene Disheveled  Behavior Characteristics Cooperative  Mood Preoccupied  Thought Process  Coherency WDL  Content Preoccupation  Delusions None reported or observed  Perception Hallucinations  Hallucination Auditory;Visual  Judgment Impaired  Confusion None  Danger to Self  Current suicidal ideation? Denies  Danger to Others  Danger to Others None reported or observed   Progress note   D: Pt seen in his room. Pt denies SI, HI. Pt endorses AVH as seeing, hearing and talking to spirits Pt rates pain 0/10. Pt rates anxiety  0/10 and depression  0/10. Pt cooperated and had an EKG performed and went to have bloodwork completed this morning. Pt is eating and sleeping well. Pt denies any other complaints at this time.  A: Pt provided support and encouragement. Pt given scheduled medication as prescribed. PRNs as appropriate. Q15 min checks for safety.   R: Pt safe on the unit. Will continue to monitor.

## 2022-06-25 DIAGNOSIS — F250 Schizoaffective disorder, bipolar type: Secondary | ICD-10-CM | POA: Diagnosis not present

## 2022-06-25 MED ORDER — HALOPERIDOL 5 MG PO TABS
10.0000 mg | ORAL_TABLET | Freq: Every day | ORAL | Status: DC
  Administered 2022-06-26 – 2022-07-09 (×14): 10 mg via ORAL
  Filled 2022-06-25 (×4): qty 2
  Filled 2022-06-25: qty 35
  Filled 2022-06-25 (×7): qty 2
  Filled 2022-06-25: qty 35
  Filled 2022-06-25 (×3): qty 2

## 2022-06-25 MED ORDER — HALOPERIDOL 5 MG PO TABS
15.0000 mg | ORAL_TABLET | Freq: Every day | ORAL | Status: DC
  Administered 2022-06-25 – 2022-07-08 (×14): 15 mg via ORAL
  Filled 2022-06-25 (×19): qty 3

## 2022-06-25 NOTE — Progress Notes (Addendum)
[facility] MD Progress Note  06/25/2022 8:07 AM Gregory Saunders  MRN:  [id number] Principal Problem: Schizoaffective disorder, bipolar type (HCC) Diagnosis: Principal Problem:   Schizoaffective disorder, bipolar type (HCC) Active Problems:   Cannabis use disorder   OSA (obstructive sleep apnea)   Prediabetes   Tobacco use disorder  Reason for Admission: Worsening psychosis  Subjective:  Patient was seen and assessed at bedside.  Patient continues to be flat and withdrawn. Patient continues to attend some of group therapy. Patient still endorsing AVH related to "spiritual world". Continues to endorse belief in telekinesis, VH of dead people and AH of Indians. He states we do not have to believe him if we don't want to. Patient has no concerns and does continue to prefer if he was to be accepted to a boardinghouse.  Patient has no concerns about medication adjustments. Patient is agreeable to increase in haldol to 15 mg qhs. No further questions at this time.  Reports no somatic complaints from medications.  Denies mood or anxiety symptoms.  Patient continues to be amenable to discharging to homeless shelter at time of discharge but expresses preference to going to a boardinghouse.  Objective:  Chart Review Past 24 hours of patient's chart was reviewed. He attended select groups. Patient is compliant with scheduled meds. Required Agitation PRNs: none (did receive Trazodone X1 for sleep) Per RN notes, cooperative but internally preoccupied Patient slept, 8.75 hours  Total Time Spent in Direct Patient Care:  I personally spent 30 minutes on the unit in direct patient care. The direct patient care time included face-to-face time with the patient, reviewing the patient's chart, communicating with other professionals, and coordinating care. Greater than 50% of this time was spent in counseling or coordinating care with the patient regarding goals of hospitalization, psycho-education, and discharge  planning needs.   Past Psychiatric History: see H&P  Past Medical History:  Past Medical History:  Diagnosis Date   Cannabis use disorder 10/16/2020   Schizophrenia (HCC)    Strain of right Achilles tendon 04/07/2019   Tobacco use disorder 06/14/2022    Past Surgical History:  Procedure Laterality Date   stye removal     WISDOM TOOTH EXTRACTION     Family History: see H&P  Family Psychiatric  History: see H&P  Social History:  Social History   Substance and Sexual Activity  Alcohol Use Never     Social History   Substance and Sexual Activity  Drug Use Not Currently   Types: Marijuana    Social History   Socioeconomic History   Marital status: Single    Spouse name: Not on file   Number of children: Not on file   Years of education: Not on file   Highest education level: Not on file  Occupational History   Not on file  Tobacco Use   Smoking status: Every Day    Packs/day: 0.50    Types: Cigarettes   Smokeless tobacco: Never  Vaping Use   Vaping Use: Every day   Substances: CBD  Substance and Sexual Activity   Alcohol use: Never   Drug use: Not Currently    Types: Marijuana   Sexual activity: Not on file  Other Topics Concern   Not on file  Social History Narrative   Not on file   Social Determinants of Health   Financial Resource Strain: Not on file  Food Insecurity: Unknown (06/14/2022)   Hunger Vital Sign    Worried About Programme researcher, broadcasting/film/video in  the Last Year: Patient refused    [place] in the Last Year: Patient refused  Transportation Needs: Unknown (06/14/2022)   [place] - Hydrologist (Medical): Patient refused    Lack of Transportation (Non-Medical): Patient refused  Physical Activity: Not on file  Stress: Not on file  Social Connections: Not on file    Current Medications: Current Facility-Administered Medications  Medication Dose Route Frequency Provider Last Rate Last Admin   acetaminophen  (TYLENOL) tablet 650 mg  650 mg Oral Q6H PRN Ajibola, Ene A, NP       alum & mag hydroxide-simeth (MAALOX/MYLANTA) 200-200-20 MG/5ML suspension 30 mL  30 mL Oral Q4H PRN Ajibola, Ene A, NP       amLODipine (NORVASC) tablet 10 mg  10 mg Oral Daily France Ravens, MD   10 mg at 06/25/22 0807   atorvastatin (LIPITOR) tablet 20 mg  20 mg Oral QHS Merrily Brittle, DO   20 mg at 06/24/22 2039   benztropine (COGENTIN) tablet 0.5 mg  0.5 mg Oral BID France Ravens, MD   0.5 mg at 06/25/22 0998   haloperidol lactate (HALDOL) injection 5 mg  5 mg Intramuscular Q6H PRN France Ravens, MD       And   LORazepam (ATIVAN) injection 2 mg  2 mg Intramuscular Q6H PRN France Ravens, MD       And   diphenhydrAMINE (BENADRYL) injection 50 mg  50 mg Intravenous Q6H PRN France Ravens, MD       docusate sodium (COLACE) capsule 100 mg  100 mg Oral Daily PRN Harlow Asa, MD       haloperidol (HALDOL) tablet 10 mg  10 mg Oral BID France Ravens, MD   10 mg at 06/25/22 0806   haloperidol (HALDOL) tablet 5 mg  5 mg Oral Q8H PRN France Ravens, MD       And   LORazepam (ATIVAN) tablet 1 mg  1 mg Oral Q8H PRN France Ravens, MD       magnesium hydroxide (MILK OF MAGNESIA) suspension 30 mL  30 mL Oral Daily PRN Ajibola, Ene A, NP       montelukast (SINGULAIR) tablet 10 mg  10 mg Oral QHS Merrily Brittle, DO   10 mg at 06/24/22 2039   multivitamin with minerals tablet 1 tablet  1 tablet Oral Daily Harlow Asa, MD   1 tablet at 06/25/22 3382   thiamine (Vitamin B-1) tablet 100 mg  100 mg Oral Daily Viann Fish E, MD   100 mg at 06/25/22 5053   traZODone (DESYREL) tablet 50 mg  50 mg Oral QHS PRN Harlow Asa, MD   50 mg at 06/24/22 2039    Lab Results:  No results found for this or any previous visit (from the past 24 hour(s)).   Blood Alcohol level:  Lab Results  Component Value Date   ETH <10 06/08/2022   ETH <10 97/67/3419    Metabolic Disorder Labs: Lab Results  Component Value Date   HGBA1C 5.8 (H) 06/24/2022   MPG 119.76  06/24/2022   No results found for: "PROLACTIN" Lab Results  Component Value Date   CHOL 186 06/13/2022   TRIG 272 (H) 06/13/2022   HDL 34 (L) 06/13/2022   CHOLHDL 5.5 06/13/2022   VLDL 54 (H) 06/13/2022   LDLCALC 98 06/13/2022    Physical Findings: CIWA:  CIWA-Ar Total: 0  Musculoskeletal: Strength & Muscle Tone: within normal limits Gait &  Station: normal  Psychiatric Specialty Exam:  Presentation  General Appearance: unkempt appearing, casually dressed   Eye Contact:Fair   Speech:Clear and Coherent; Normal Rate   Speech Volume:Normal   Handedness:Right    Mood and Affect  Mood: "fine"- appears aloof and irritable   Affect:flat, guarded, irritable    Thought Process  Thought Processes:concrete, evasive, more linear today  Orientation:Oriented to month, year, and city, not situation   Thought Content: Continues to appear paranoid; illogical belief he is a voodoo doll and vampire; reports AVH; denies ideas of reference; has belief in telekinesis; denies belief in thought insertion/withdrawal/broadcasting; denies SI or HI   History of Schizophrenia/Schizoaffective disorder:Yes   Duration of Psychotic Symptoms:Greater than six months   Hallucinations:AVH - seeing dead people and AH from spirit world and of Indians   Suicidal Thoughts:denies  Homicidal Thoughts:denies   Sensorium  Memory: Limited secondary to psychosis   Judgment:Impaired   Insight:None    Art therapist  Concentration:Fair   Attention Span:Fair  Recall:not formally tested   [employer] of Knowledge:Limited    Language:Fair    Psychomotor Activity  Psychomotor Activity: AIMS 0, no cogwheeling, stiffness or tremor   Assets  Assets:Communication Skills; Desire for Improvement  Physical Exam: Review of Systems  Respiratory:  Negative for shortness of breath.   Cardiovascular:  Negative for chest pain.  Gastrointestinal:  Negative for abdominal pain,  constipation, diarrhea, heartburn, nausea and vomiting.  Neurological:  Negative for headaches.   Blood pressure (!) 153/101, pulse (!) 123, temperature 97.9 F (36.6 C), temperature source Oral, resp. rate 16, height 6\' 2"  (1.88 m), weight 130.2 kg, SpO2 95 %. Body mass index is 36.85 kg/m.   ASSESSMENT AND PLAN VERONICA GUERRANT is a 26 y.o., male with PMH significant for schizoaffective disorder-bipolar type, cannabis use disorder, tobacco use disorder, who presented to [facility] (06/08/2022) via parents for assaulting parents and bizarre behaviors, then admitted to [facility] (06/13/2022). Involuntary treatment of acute psychosis in the setting of medication nonadherence and THC use. This is hospitalization day 12.  PLAN Safety and Monitoring: Involuntary admission to inpatient psychiatric unit for safety, stabilization and treatment Daily contact with patient to assess and evaluate symptoms and progress in treatment Patient's case to be discussed in multi-disciplinary team meeting Observation Level : q15 minute checks Vital signs: q12 hours Precautions: suicide, elopement, and assault  2. Psychiatric Diagnoses and Treatment:  # Schizoaffective, bipolar type Patient continues to be psychotic and paranoid.   INCREASE Haloperidol to 10 mg in AM and 15 mg at night for continued psychotic symptoms Continue Cogentin 0.5 mg bid for EPS prophylaxis  Metabolic profile and EKG monitoring obtained while on an atypical antipsychotic (BMI 36.83 Lipid Panel nonfasting: LDL 98, cholesterol 186, triglycerides 272, HDL 34 (06/13/2022), HbgA1c: 5.8, QTc: 450 (06/14/2022) and QTC 06/16/2022 (06/24/2022)) Agitation protocol ordered PRN [facility] referral made by SW Given his lack of insight into his mental health issues, the chronic nature of his delusions and psychosis, the recent aggression with family prior to admission, and concerns for lack of medication compliance after discharge, the  patient would likely benefit from a higher level of care after discharge than boarding house or shelter but is presently refusing a group home. He would likely benefit from a guardian to assist with decision making after discharge given his ongoing refractory psychosis.   3. Medical Issues Being Addressed:  #Elevated creatinine - improved Creatinine 1.33 > 1.20 (appears chronic compared to labs 11-12 months ago when  creatinine was 1.35-1.46) Repeat BMP 9/27 shows creatinine 1.20 Encourage p.o. fluids   #Prediabetes A1c 5.8 06/24/22 - will need f/u with PCP after discharge for monitoring while on antipsychotic medication   #Hypercholesterolemia Continue home atorvastatin 20 mg qHS   # Incomplete right bundle branch block # OSA  Does have h/o OSA, would benefit from CPAP. Saw sleep clinic in June 2023.  Follow-up with PCP Would benefit from resuming sleep clinic f/u for CPAP Repeat EKG 9/27 NSR with Incomplete RBBB - f/u with PCP after discharge   #Tobacco Use Disorder #Cannabis use disorder # r/o ETOH use disorder             -- CIWA discontinued based on scores -- Nicotine patch 21mg /24 hours ordered             -- Smoking cessation encouraged and illicit drug use discouraged   # Seasonal allergies # Atopic dermatitis Continued home [place]   #Hypertension Will continue Clonidine PRN for SBP >160 or DBP >110 and monitor Continue Amlodipine 10 mg as blood pressure continues to be elevated   4. Discharge Planning:  -- Social work and case management to assist with discharge planning and identification of hospital follow-up needs prior to discharge             -- Discharge Concerns: Need to establish a safety plan; Medication compliance and effectiveness             -- Discharge Goals: Return home with outpatient referrals for mental health follow-up including medication management/psychotherapy Patient unable to return home to parents - recommend guardianship at this  time Patient has been served 50b since [facility] admission   03-16-1982, MD 06/25/2022, 8:07 AM

## 2022-06-25 NOTE — Progress Notes (Signed)
   06/25/22 2010  Psych Admission Type (Psych Patients Only)  Admission Status Involuntary  Psychosocial Assessment  Patient Complaints None  Eye Contact Fair  Facial Expression Flat  Affect Blunted  Speech Logical/coherent  Interaction Minimal  Motor Activity Slow  Appearance/Hygiene In scrubs  Behavior Characteristics Cooperative  Mood Preoccupied;Pleasant  Thought Process  Coherency WDL  Content Preoccupation  Delusions None reported or observed  Perception Hallucinations  Hallucination Auditory;Visual (he describes the spirits as "dead white people")  Judgment Impaired  Confusion None  Danger to Self  Current suicidal ideation? Denies  Danger to Others  Danger to Others None reported or observed   Progress note   D: Pt seen in hallway. Pt denies SI, HI. Endorses AVH as interacting with spirits. "All I see is dead white people." Pt rates pain  0/10. Pt rates anxiety  0/10 and depression  0/10. Pt denies any side effects from medication. Pt has been out of his room more. No other complaints noted at this time.  A: Pt provided support and encouragement. Pt given scheduled medication as prescribed. PRNs as appropriate. Q15 min checks for safety.   R: Pt safe on the unit. Will continue to monitor.

## 2022-06-25 NOTE — Group Note (Signed)
Recreation Therapy Group Note   Group Topic:Coping Skills  Group Date: 06/25/2022 Start Time: 1005 End Time: 1035 Facilitators: Richad Ramsay-McCall, LRT,CTRS Location: [address] Dayroom   Goal Area(s) Addresses: Patient will define what a coping skill is. Patient will create a list of healthy coping skills beginning with each letter of the alphabet. Patient will successfully identify positive coping skills they can use post d/c.    Group Description: Coping A to Z. Patient asked to identify what a coping skill is and when they use them. Patients with Probation officer discussed healthy versus unhealthy coping skills. Next patients were given a blank worksheet titled "Coping Skills A-Z". Partners were instructed to come up with at least one positive coping skill per letter of the alphabet. Patients were given 15 minutes to brainstorm, before ideas were presented to the large group. Patients and LRT debriefed on the importance of coping skill selection based on situation and back-up plans when a skill tried is not effective. At the end of group, patients were given an handout of alphabetized strategies to keep for future reference.    Affect/Mood: N/A   Participation Level: Did not attend    Clinical Observations/Individualized Feedback:     Plan: Continue to engage patient in RT group sessions 2-3x/week.   Renata Gambino-McCall, LRT,CTRS 06/25/2022 12:34 PM

## 2022-06-25 NOTE — Plan of Care (Signed)
Alert, verbal and able to make needs known. Patient has been in his room most of the room today. He continues to deny SI/HI/AVH but appears to be responding to internal stimuli. Denies any pain at present. Pt did not fill out his self-inventory adult sheet. No abnormal or involuntarily movements noted. Will continue q38min checks and provide verbal encouragement as needed.    Problem: Education: Goal: Knowledge of [place] [facility] Education information/materials will improve Outcome: Progressing Goal: Emotional status will improve Outcome: Progressing Goal: Mental status will improve Outcome: Progressing Goal: Verbalization of understanding the information provided will improve Outcome: Progressing   Problem: Activity: Goal: Interest or engagement in activities will improve Outcome: Not Progressing Goal: Sleeping patterns will improve Outcome: Not Progressing   Problem: Physical Regulation: Goal: Ability to maintain clinical measurements within normal limits will improve Outcome: Progressing   Problem: Safety: Goal: Periods of time without injury will increase Outcome: Progressing

## 2022-06-25 NOTE — Progress Notes (Signed)
   06/25/22 0552  Sleep  Number of Hours 8.75

## 2022-06-26 DIAGNOSIS — F250 Schizoaffective disorder, bipolar type: Secondary | ICD-10-CM | POA: Diagnosis not present

## 2022-06-26 MED ORDER — DIVALPROEX SODIUM 500 MG PO DR TAB
500.0000 mg | DELAYED_RELEASE_TABLET | Freq: Two times a day (BID) | ORAL | Status: DC
  Filled 2022-06-26 (×4): qty 1

## 2022-06-26 MED ORDER — DIVALPROEX SODIUM 500 MG PO DR TAB
1000.0000 mg | DELAYED_RELEASE_TABLET | Freq: Every day | ORAL | Status: DC
  Filled 2022-06-26: qty 2

## 2022-06-26 MED ORDER — DIVALPROEX SODIUM ER 500 MG PO TB24
1000.0000 mg | ORAL_TABLET | Freq: Every day | ORAL | Status: DC
  Administered 2022-06-26 – 2022-07-08 (×13): 1000 mg via ORAL
  Filled 2022-06-26: qty 2
  Filled 2022-06-26: qty 14
  Filled 2022-06-26 (×5): qty 2
  Filled 2022-06-26: qty 14
  Filled 2022-06-26 (×10): qty 2

## 2022-06-26 NOTE — Progress Notes (Signed)
[facility] MD Progress Note  06/26/2022 10:42 AM Gregory Saunders  MRN:  [id number] Principal Problem: Schizoaffective disorder, bipolar type (HCC) Diagnosis: Principal Problem:   Schizoaffective disorder, bipolar type (HCC) Active Problems:   Cannabis use disorder   OSA (obstructive sleep apnea)   Prediabetes   Tobacco use disorder  Reason for Admission: Worsening psychosis  Subjective:  Patient was seen and assessed at bedside.  Patient continues to be flat and withdrawn. Patient continues to attend some of group therapy. Patient still endorsing AVH related to "spiritual world". Continues to endorse belief in telekinesis, VH of dead people and AH of Indians. He states we do not have to believe him if we don't want to. Patient has no concerns and does continue to prefer if he was to be accepted to a boardinghouse.  Patient has no concerns about medication adjustments. Patient agrees to taking Depakote 1000 mg nightly for mood lability.  Patient describes having hostility towards one of the patients in the milieu and defense of another patient who he thinks is his cousin.  Discussed that we do not allow for aggression of any form whether it be verbal or physical.  Reports no somatic complaints from medications.  Denies mood or anxiety symptoms.  Patient continues to be amenable to discharging to homeless shelter at time of discharge but expresses preference to going to a boardinghouse.  Objective:  Chart Review Past 24 hours of patient's chart was reviewed. He attended select groups. Patient is compliant with scheduled meds. Required Agitation PRNs: none (did receive Trazodone X1 for sleep) Per RN notes, cooperative but internally preoccupied Patient slept, 8.75 hours  Total Time Spent in Direct Patient Care:  I personally spent 30 minutes on the unit in direct patient care. The direct patient care time included face-to-face time with the patient, reviewing the patient's chart, communicating  with other professionals, and coordinating care. Greater than 50% of this time was spent in counseling or coordinating care with the patient regarding goals of hospitalization, psycho-education, and discharge planning needs.   Past Psychiatric History: see H&P  Past Medical History:  Past Medical History:  Diagnosis Date   Cannabis use disorder 10/16/2020   Schizophrenia (HCC)    Strain of right Achilles tendon 04/07/2019   Tobacco use disorder 06/14/2022    Past Surgical History:  Procedure Laterality Date   stye removal     WISDOM TOOTH EXTRACTION     Family History: see H&P  Family Psychiatric  History: see H&P  Social History:  Social History   Substance and Sexual Activity  Alcohol Use Never     Social History   Substance and Sexual Activity  Drug Use Not Currently   Types: Marijuana    Social History   Socioeconomic History   Marital status: Single    Spouse name: Not on file   Number of children: Not on file   Years of education: Not on file   Highest education level: Not on file  Occupational History   Not on file  Tobacco Use   Smoking status: Every Day    Packs/day: 0.50    Types: Cigarettes   Smokeless tobacco: Never  Vaping Use   Vaping Use: Every day   Substances: CBD  Substance and Sexual Activity   Alcohol use: Never   Drug use: Not Currently    Types: Marijuana   Sexual activity: Not on file  Other Topics Concern   Not on file  Social History Narrative   Not  on file   Social Determinants of Health   Financial Resource Strain: Not on file  Food Insecurity: Unknown (06/14/2022)   Hunger Vital Sign    Worried About Running Out of Food in the Last Year: Patient refused    Ran Out of Food in the Last Year: Patient refused  Transportation Needs: Unknown (06/14/2022)   PRAPARE - Administrator, Civil Service (Medical): Patient refused    Lack of Transportation (Non-Medical): Patient refused  Physical Activity: Not on file   Stress: Not on file  Social Connections: Not on file    Current Medications: Current Facility-Administered Medications  Medication Dose Route Frequency Provider Last Rate Last Admin   acetaminophen (TYLENOL) tablet 650 mg  650 mg Oral Q6H PRN Ajibola, Ene A, NP       alum & mag hydroxide-simeth (MAALOX/MYLANTA) 200-200-20 MG/5ML suspension 30 mL  30 mL Oral Q4H PRN Ajibola, Ene A, NP       amLODipine (NORVASC) tablet 10 mg  10 mg Oral Daily Park Pope, MD   10 mg at 06/26/22 0808   atorvastatin (LIPITOR) tablet 20 mg  20 mg Oral QHS Princess Bruins, DO   20 mg at 06/25/22 2031   benztropine (COGENTIN) tablet 0.5 mg  0.5 mg Oral BID Park Pope, MD   0.5 mg at 06/26/22 0388   haloperidol lactate (HALDOL) injection 5 mg  5 mg Intramuscular Q6H PRN Park Pope, MD       And   LORazepam (ATIVAN) injection 2 mg  2 mg Intramuscular Q6H PRN Park Pope, MD       And   diphenhydrAMINE (BENADRYL) injection 50 mg  50 mg Intravenous Q6H PRN Park Pope, MD       divalproex (DEPAKOTE) DR tablet 500 mg  500 mg Oral Q12H Park Pope, MD       docusate sodium (COLACE) capsule 100 mg  100 mg Oral Daily PRN Mason Jim, Amy E, MD       haloperidol (HALDOL) tablet 10 mg  10 mg Oral Q1200 Park Pope, MD   10 mg at 06/26/22 8280   haloperidol (HALDOL) tablet 15 mg  15 mg Oral Seabron Spates, MD   15 mg at 06/25/22 2031   haloperidol (HALDOL) tablet 5 mg  5 mg Oral Q8H PRN Park Pope, MD       And   LORazepam (ATIVAN) tablet 1 mg  1 mg Oral Q8H PRN Park Pope, MD       magnesium hydroxide (MILK OF MAGNESIA) suspension 30 mL  30 mL Oral Daily PRN Ajibola, Ene A, NP       montelukast (SINGULAIR) tablet 10 mg  10 mg Oral QHS Princess Bruins, DO   10 mg at 06/25/22 2031   multivitamin with minerals tablet 1 tablet  1 tablet Oral Daily Comer Locket, MD   1 tablet at 06/26/22 0349   thiamine (Vitamin B-1) tablet 100 mg  100 mg Oral Daily Bartholomew Crews E, MD   100 mg at 06/26/22 1791   traZODone (DESYREL) tablet 50 mg   50 mg Oral QHS PRN Comer Locket, MD   50 mg at 06/25/22 2031    Lab Results:  No results found for this or any previous visit (from the past 24 hour(s)).   Blood Alcohol level:  Lab Results  Component Value Date   [facility] <10 06/08/2022   ETH <10 06/20/2021    Metabolic Disorder Labs: Lab Results  Component Value Date  HGBA1C 5.8 (H) 06/24/2022   MPG 119.76 06/24/2022   No results found for: "PROLACTIN" Lab Results  Component Value Date   CHOL 186 06/13/2022   TRIG 272 (H) 06/13/2022   HDL 34 (L) 06/13/2022   CHOLHDL 5.5 06/13/2022   VLDL 54 (H) 06/13/2022   LDLCALC 98 06/13/2022    Physical Findings: CIWA:  CIWA-Ar Total: 0  Musculoskeletal: Strength & Muscle Tone: within normal limits Gait & Station: normal  Psychiatric Specialty Exam:  Presentation  General Appearance: unkempt appearing, casually dressed   Eye Contact:Fair   Speech:Clear and Coherent; Normal Rate   Speech Volume:Normal   Handedness:Right    Mood and Affect  Mood: "fine"- appears aloof and irritable   Affect:flat, guarded, irritable    Thought Process  Thought Processes:concrete, evasive, more linear today  Orientation:Oriented to month, year, and city, not situation   Thought Content: Continues to appear paranoid; illogical belief he is a voodoo doll and vampire; reports AVH; denies ideas of reference; has belief in telekinesis; denies belief in thought insertion/withdrawal/broadcasting; denies SI or HI   History of Schizophrenia/Schizoaffective disorder:Yes   Duration of Psychotic Symptoms:Greater than six months   Hallucinations:AVH - seeing dead people and AH from spirit world and of Indians   Suicidal Thoughts:denies  Homicidal Thoughts:denies   Sensorium  Memory: Limited secondary to psychosis   Judgment:Impaired   Insight:None    Community education officer  Concentration:Fair   Attention Span:Fair  Recall:not formally tested   [employer] of  Knowledge:Limited    Language:Fair    Psychomotor Activity  Psychomotor Activity: AIMS 0, no cogwheeling, stiffness or tremor   Assets  Assets:Communication Skills; Desire for Improvement  Physical Exam: Review of Systems  Respiratory:  Negative for shortness of breath.   Cardiovascular:  Negative for chest pain.  Gastrointestinal:  Negative for abdominal pain, constipation, diarrhea, heartburn, nausea and vomiting.  Neurological:  Negative for headaches.   Blood pressure 136/88, pulse (!) 117, temperature 98.1 F (36.7 C), temperature source Oral, resp. rate 18, height 6\' 2"  (1.88 m), weight 130.2 kg, SpO2 98 %. Body mass index is 36.85 kg/m.   ASSESSMENT AND PLAN Gregory Saunders is a 26 y.o., male with PMH significant for schizoaffective disorder-bipolar type, cannabis use disorder, tobacco use disorder, who presented to [facility] (06/08/2022) via parents for assaulting parents and bizarre behaviors, then admitted to [facility] (06/13/2022). Involuntary treatment of acute psychosis in the setting of medication nonadherence and THC use. This is hospitalization day 13.  PLAN Safety and Monitoring: Involuntary admission to inpatient psychiatric unit for safety, stabilization and treatment Daily contact with patient to assess and evaluate symptoms and progress in treatment Patient's case to be discussed in multi-disciplinary team meeting Observation Level : q15 minute checks Vital signs: q12 hours Precautions: suicide, elopement, and assault  2. Psychiatric Diagnoses and Treatment:  # Schizoaffective, bipolar type Patient continues to be psychotic and paranoid.   CONTINUE Haloperidol to 10 mg in AM and 15 mg at night for continued psychotic symptoms Continue Cogentin 0.5 mg bid for EPS prophylaxis  Metabolic profile and EKG monitoring obtained while on an atypical antipsychotic (BMI 36.83 Lipid Panel nonfasting: LDL 98, cholesterol 186, triglycerides  272, HDL 34 (06/13/2022), HbgA1c: 5.8, QTc: 450 (06/14/2022) and QTC 457ms (06/24/2022)) START Depakote 1000 mg qhs for mood lability (depakote trough level, CBC, hepatic function panel to be performed on 05/31/22) Agitation protocol ordered PRN [facility] referral made by SW Given his lack of insight into his mental health  issues, the chronic nature of his delusions and psychosis, the recent aggression with family prior to admission, and concerns for lack of medication compliance after discharge, the patient would likely benefit from a higher level of care after discharge than boarding house or shelter but is presently refusing a group home. He would likely benefit from a guardian to assist with decision making after discharge given his ongoing refractory psychosis.   3. Medical Issues Being Addressed:  #Elevated creatinine - improved Creatinine 1.33 > 1.20 (appears chronic compared to labs 11-12 months ago when creatinine was 1.35-1.46) Repeat BMP 9/27 shows creatinine 1.20 Encourage p.o. fluids   #Prediabetes A1c 5.8 06/24/22 - will need f/u with PCP after discharge for monitoring while on antipsychotic medication   #Hypercholesterolemia Continue home atorvastatin 20 mg qHS   # Incomplete right bundle branch block # OSA  Does have h/o OSA, would benefit from CPAP. Saw sleep clinic in June 2023.  Follow-up with PCP Would benefit from resuming sleep clinic f/u for CPAP Repeat EKG 9/27 NSR with Incomplete RBBB - f/u with PCP after discharge   #Tobacco Use Disorder #Cannabis use disorder # r/o ETOH use disorder             -- CIWA discontinued based on scores -- Nicotine patch 21mg /24 hours ordered             -- Smoking cessation encouraged and illicit drug use discouraged   # Seasonal allergies # Atopic dermatitis Continued home singulare   #Hypertension Will continue Clonidine PRN for SBP >160 or DBP >110 and monitor Continue Amlodipine 10 mg as blood pressure continues to be elevated    4. Discharge Planning:  -- Social work and case management to assist with discharge planning and identification of hospital follow-up needs prior to discharge             -- Discharge Concerns: Need to establish a safety plan; Medication compliance and effectiveness             -- Discharge Goals: Return home with outpatient referrals for mental health follow-up including medication management/psychotherapy Patient unable to return home to parents - recommend guardianship at this time Patient has been served 50b since [facility] admission   03-16-1982, MD 06/26/2022, 10:42 AM

## 2022-06-26 NOTE — [facility] Group Notes (Signed)
Chaplain unable to provide spirituality group today due to being short-staffed in the Spiritual Care and Counseling Dept.   

## 2022-06-26 NOTE — Group Note (Signed)
[facility] LCSW Group Therapy Note   Group Date: 06/26/2022 Start Time: 1100 End Time: 1200   Type of Therapy/Topic:  Group Therapy:  Emotion Regulation  Participation Level:  Active   Description of Group:    The purpose of this group is to assist patients in learning to regulate negative emotions and experience positive emotions. Patients will be guided to discuss ways in which they have been vulnerable to their negative emotions. These vulnerabilities will be juxtaposed with experiences of positive emotions or situations, and patients challenged to use positive emotions to combat negative ones. Special emphasis will be placed on coping with negative emotions in conflict situations, and patients will process healthy conflict resolution skills.  Therapeutic Goals: Patient will identify two positive emotions or experiences to reflect on in order to balance out negative emotions:  Patient will label two or more emotions that they find the most difficult to experience:  Patient will be able to demonstrate positive conflict resolution skills through discussion or role plays:   Summary of Patient Progress:    Patient participated appropriately in group and had good insight into group.  Patient was respectful to peers and participated in group activities.     Therapeutic Modalities:   Cognitive Behavioral Therapy Feelings Identification Dialectical Behavioral Therapy   Justice Milliron E Jakyrah Holladay, LCSW 

## 2022-06-26 NOTE — Progress Notes (Signed)
   06/26/22 0559  Sleep  Number of Hours 9

## 2022-06-26 NOTE — [facility] Counselor (Addendum)
[facility] LCSW Progress Note   06/26/2022    1:06 PM  Gregory Saunders   [id number]   Type of Contact and Topic:  DSS and guardianship  Marline Backbone, [place] caseworker, called and left a message for social worker stating that she needed additional information re: petitioning for guardianship.  CSW called back and left a voicemail.  Addendum: Sharyn Lull called back and said that she submitted paperwork for petitioning guardianship and she would be in contact regarding a date for the guardianship hearing.    Signed:  Riki Altes, MSW, LCSW, LCAS 06/26/2022 1:06 PM

## 2022-06-26 NOTE — Progress Notes (Signed)
Patient denies SI/HI/AVH/pain this morning.  He reported good sleep and good appetite this morning at breakfast.  Patient took ordered morning meds without incident and said that he was going back to his room to rest. Patient was observed in the dayroom for groups and went to the cafeteria for meals. Continued 82min checks for safety. Patient encouraged to come to staff with any needs.

## 2022-06-26 NOTE — Group Note (Signed)
Recreation Therapy Group Note   Group Topic:Healthy Decision Making  Group Date: 06/26/2022 Start Time: 1030 End Time: 1040 Facilitators: Chanel Mckesson-McCall, LRT,CTRS Location: [address] Dayroom   Goal Area(s) Addresses:  Patient will effectively work with peer towards shared goal.  Patient will identify factors that guided their decision making.  Patient will pro-socially communicate ideas during group session.   Group Description:  Patients were given a scenario that they were going to be stranded on a deserted [place] for several months before being rescued. Writer tasked them with making a list of 15 things they would choose to bring with them for "survival". The list of items was prioritized most important to least. Each patient would come up with their own list, then work together to create a new list of 15 items while in a group of 3-5 peers. LRT discussed each person's list and how it differed from others. The debrief included discussion of priorities, good decisions versus bad decisions, and how it is important to think before acting so we can make the best decision possible. LRT tied the concept of effective communication among group members to patient's support systems outside of the hospital and its benefit post discharge.   Affect/Mood: N/A   Participation Level: Did not attend    Clinical Observations/Individualized Feedback:    Plan: Continue to engage patient in RT group sessions 2-3x/week.   Mackayla Mullins-McCall, LRT,CTRS 06/26/2022 1:26 PM

## 2022-06-27 DIAGNOSIS — F250 Schizoaffective disorder, bipolar type: Secondary | ICD-10-CM | POA: Diagnosis not present

## 2022-06-27 NOTE — Progress Notes (Signed)
[facility] MD Progress Note  06/27/2022 2:59 PM OTHON PANDOLFO  MRN:  [id number]  Principal Problem: Schizoaffective disorder, bipolar type ([place]) Diagnosis: Principal Problem:   Schizoaffective disorder, bipolar type ([place]) Active Problems:   Cannabis use disorder   OSA (obstructive sleep apnea)   Prediabetes   Tobacco use disorder  Patient is a  26y.o. male with psych history of schizoaffective disorder, who presents to the [facility] unit due to worsening psychosis.    Interval History Patient was seen today for re-evaluation.  Nursing reports no events overnight. The patient has no issues with performing ADLs.  Patient has been medication compliant.    Patient was seen and interviewed by attending psychiatrist. Chart reviewed. Patient discussed during treatment team rounds.  Subjective:  Patient spent almost all first part of the day today in his bed. On assessment patient appears flat and withdrawn. He denies any complaints - denies denies feeling depressed, anxious, denies auditory or visual hallucinations, denies feeling paranoid, unsafe. He denies thoughts or plans of hurting self or others. He reports feeling sleepy and thinks it can be due to side effects from medications he is getting here. He denies any physical complaints. He is asking about discharge. He continues expressing his unwillingness to go to group home. We wants to be sent to a boardinghouse. He says "I have not much to say. I want to be discharged".  Labs: no new results for review.  Total Time spent with patient: 30 minutes  Past Psychiatric History: see H&P  Past Medical History:  Past Medical History:  Diagnosis Date   Cannabis use disorder 10/16/2020   Schizophrenia ([place])    Strain of right Achilles tendon 04/07/2019   Tobacco use disorder 06/14/2022    Past Surgical History:  Procedure Laterality Date   stye removal     WISDOM TOOTH EXTRACTION     Family History: History reviewed. No pertinent family  history. Family Psychiatric  History: see H&P Social History:  Social History   Substance and Sexual Activity  Alcohol Use Never     Social History   Substance and Sexual Activity  Drug Use Not Currently   Types: Marijuana    Social History   Socioeconomic History   Marital status: Single    Spouse name: Not on file   Number of children: Not on file   Years of education: Not on file   Highest education level: Not on file  Occupational History   Not on file  Tobacco Use   Smoking status: Every Day    Packs/day: 0.50    Types: Cigarettes   Smokeless tobacco: Never  Vaping Use   Vaping Use: Every day   Substances: CBD  Substance and Sexual Activity   Alcohol use: Never   Drug use: Not Currently    Types: Marijuana   Sexual activity: Not on file  Other Topics Concern   Not on file  Social History Narrative   Not on file   Social Determinants of Health   Financial Resource Strain: Not on file  Food Insecurity: Unknown (06/14/2022)   Hunger Vital Sign    Worried About Running Out of Food in the Last Year: Patient refused    Ran Out of Food in the Last Year: Patient refused  Transportation Needs: Unknown (06/14/2022)   PRAPARE - Hydrologist (Medical): Patient refused    Lack of Transportation (Non-Medical): Patient refused  Physical Activity: Not on file  Stress: Not on file  Social Connections: Not on file   Additional Social History:                         Sleep: Fair  Appetite:  Good  Current Medications: Current Facility-Administered Medications  Medication Dose Route Frequency Provider Last Rate Last Admin   acetaminophen (TYLENOL) tablet 650 mg  650 mg Oral Q6H PRN Ajibola, Ene A, NP       alum & mag hydroxide-simeth (MAALOX/MYLANTA) 200-200-20 MG/5ML suspension 30 mL  30 mL Oral Q4H PRN Ajibola, Ene A, NP       amLODipine (NORVASC) tablet 10 mg  10 mg Oral Daily France Ravens, MD   10 mg at 06/27/22 0733    atorvastatin (LIPITOR) tablet 20 mg  20 mg Oral QHS Merrily Brittle, DO   20 mg at 06/26/22 2052   benztropine (COGENTIN) tablet 0.5 mg  0.5 mg Oral BID France Ravens, MD   0.5 mg at 06/27/22 [id number]   haloperidol lactate (HALDOL) injection 5 mg  5 mg Intramuscular Q6H PRN France Ravens, MD       And   LORazepam (ATIVAN) injection 2 mg  2 mg Intramuscular Q6H PRN France Ravens, MD       And   diphenhydrAMINE (BENADRYL) injection 50 mg  50 mg Intravenous Q6H PRN France Ravens, MD       divalproex (DEPAKOTE ER) 24 hr tablet 1,000 mg  1,000 mg Oral QHS Nelda Marseille, Amy E, MD   1,000 mg at 06/26/22 2050   docusate sodium (COLACE) capsule 100 mg  100 mg Oral Daily PRN Harlow Asa, MD       haloperidol (HALDOL) tablet 10 mg  10 mg Oral Q1200 France Ravens, MD   10 mg at 06/27/22 0732   haloperidol (HALDOL) tablet 15 mg  15 mg Oral Aliene Altes, MD   15 mg at 06/26/22 2052   haloperidol (HALDOL) tablet 5 mg  5 mg Oral Q8H PRN France Ravens, MD       And   LORazepam (ATIVAN) tablet 1 mg  1 mg Oral Q8H PRN France Ravens, MD       magnesium hydroxide (MILK OF MAGNESIA) suspension 30 mL  30 mL Oral Daily PRN Ajibola, Ene A, NP       montelukast (SINGULAIR) tablet 10 mg  10 mg Oral QHS Merrily Brittle, DO   10 mg at 06/26/22 2052   multivitamin with minerals tablet 1 tablet  1 tablet Oral Daily Harlow Asa, MD   1 tablet at 06/27/22 [id number]   thiamine (Vitamin B-1) tablet 100 mg  100 mg Oral Daily Viann Fish E, MD   100 mg at 06/27/22 [id number]   traZODone (DESYREL) tablet 50 mg  50 mg Oral QHS PRN Harlow Asa, MD   50 mg at 06/26/22 2049    Lab Results: No results found for this or any previous visit (from the past 64 hour(s)).  Blood Alcohol level:  Lab Results  Component Value Date   ETH <10 06/08/2022   ETH <10 [id number]    Metabolic Disorder Labs: Lab Results  Component Value Date   HGBA1C 5.8 (H) 06/24/2022   MPG 119.76 06/24/2022   No results found for: "PROLACTIN" Lab Results  Component Value Date    CHOL 186 06/13/2022   TRIG 272 (H) 06/13/2022   HDL 34 (L) 06/13/2022   CHOLHDL 5.5 06/13/2022   VLDL 54 (H) 06/13/2022   LDLCALC 98 06/13/2022  Physical Findings: AIMS: Facial and Oral Movements Muscles of Facial Expression: None, normal Lips and Perioral Area: None, normal Jaw: None, normal Tongue: None, normal,Extremity Movements Upper (arms, wrists, hands, fingers): None, normal Lower (legs, knees, ankles, toes): None, normal, Trunk Movements Neck, shoulders, hips: None, normal, Overall Severity Severity of abnormal movements (highest score from questions above): None, normal Incapacitation due to abnormal movements: None, normal Patient's awareness of abnormal movements (rate only patient's report): No Awareness, Dental Status Current problems with teeth and/or dentures?: Yes (missing) Does patient usually wear dentures?: No  CIWA:  CIWA-Ar Total: 0 COWS:     Musculoskeletal: Strength & Muscle Tone: within normal limits Gait & Station: normal Patient leans: N/A  Psychiatric Specialty Exam:  Appearance:  AAM, appearing stated age,  wearing appropriate to the situation casual/hospital clothes, with decreased grooming and hygiene. Normal level of alertness.  Attitude/Behavior: calm, cooperative, guarded, engaging with variable eye contact.  Motor: WNL; dyskinesias not evident.   Speech: spontaneous, clear, coherent, normal comprehension.  Mood: dysthymic, " I want to be discharged".  Affect: blunted, flat.  Thought process: patient appears coherent, concrete but linear with questions  Thought content: patient denies suicidal thoughts, denies homicidal thoughts; did not express any delusions to me today, although per chart yesterday was "paranoid; illogical belief he is a voodoo doll and vampire; reports AVH; denies ideas of reference; has belief in telekinesis".  Thought perception: patient denies auditory and visual hallucinations. Did not appear internally  stimulated today.  Cognition: patient is alert and oriented in self, place, date.  Insight: poor  Judgement: poor  Assets: Armed forces logistics/support/administrative officer; Desire for Improvement   Sleep  Sleep:No data recorded   Physical Exam: Physical Exam ROS Blood pressure (!) 142/92, pulse (!) 105, temperature 97.6 F (36.4 C), temperature source Oral, resp. rate 20, height 6\' 2"  (1.88 m), weight 130.2 kg, SpO2 98 %. Body mass index is 36.85 kg/m.  Physical/General: alert, NAD. Skin: no rashes. HEENT:  Normocephalic, atraumatic, PERRLA.  Trunk/Extremities: no gross abnormalities evident Pulmonary: Pulmonary effort is normal.  Neuro: grossly non-focal, dyskinesias not evident, gait appears in full range. Mental Status: He is alert.  Other Medical: N/A   Vitals and nursing note reviewed.    Review of Systems: Respiratory:  Negative for shortness of breath.   Cardiovascular:  Negative for chest pain.  Gastrointestinal:  Negative for diarrhea, nausea and vomiting.  Neurological:  Negative for dizziness and headaches.     Treatment Plan Summary: Daily contact with patient to assess and evaluate symptoms and progress in treatment and Medication management  ERMAN THUM is a 26 y.o., male with PMH significant for schizoaffective disorder-bipolar type, cannabis use disorder, tobacco use disorder, who presented to [facility] (06/08/2022) via parents for assaulting parents and bizarre behaviors, then admitted to [facility] (06/13/2022). Involuntary treatment of acute psychosis in the setting of medication Chart reviewed. Patient discussed with nursing. Patient appears calmer, although still likely psychotic and paranoid. Mood stabilized started yesterday - no changes today; will continue all meds as per the plan.  Diagnoses/ Active problems: -Schizoaffective, bipolar type   PLAN:  Safety and Monitoring: continue inpatient psych admission; 15-minute checks; daily contact  with patient to assess and evaluate symptoms and progress in treatment; psychoeducation.Vital signs: q12 hours. Precautions: suicide, elopement, and assault. Placed on room lock out for meals, snacks and groups.  Psychiatric Problems: continue Haloperidol to 10 mg in AM and 15 mg at night for continued psychotic symptoms Continue Cogentin 0.5  mg bid for EPS prophylaxis  Metabolic profile and EKG monitoring obtained while on an atypical antipsychotic (BMI 36.83 Lipid Panel nonfasting: LDL 98, cholesterol 186, triglycerides 272, HDL 34 (06/13/2022), HbgA1c: 5.8, QTc: 450 (06/14/2022) and QTC 474ms (06/24/2022)) Continue Depakote 1000 mg qhs for mood lability (med started 9/29; depakote trough level, CBC, hepatic function panel to be performed on 05/31/22) Agitation protocol ordered PRN [facility] referral made by SW Given his lack of insight into his mental health issues, the chronic nature of his delusions and psychosis, the recent aggression with family prior to admission, and concerns for lack of medication compliance after discharge, the patient would likely benefit from a higher level of care after discharge than boarding house or shelter but is presently refusing a group home. He would likely benefit from a guardian to assist with decision making after discharge given his ongoing refractory psychosis.   Medical Problems: #Elevated creatinine - improved Creatinine 1.33 > 1.20 (appears chronic compared to labs 11-12 months ago when creatinine was 1.35-1.46) Repeat BMP 9/27 shows creatinine 1.20 Encourage p.o. fluids   #Prediabetes A1c 5.8 06/24/22 - will need f/u with PCP after discharge for monitoring while on antipsychotic medication   #Hypercholesterolemia Continue home atorvastatin 20 mg qHS   # Incomplete right bundle branch block # OSA  Does have h/o OSA, would benefit from CPAP. Saw sleep clinic in June 2023.  Follow-up with PCP Would benefit from resuming sleep clinic f/u for CPAP Repeat EKG  9/27 NSR with Incomplete RBBB - f/u with PCP after discharge   #Tobacco Use Disorder #Cannabis use disorder # r/o ETOH use disorder             -- CIWA discontinued based on scores -- Nicotine patch 21mg /24 hours ordered             -- Smoking cessation encouraged and illicit drug use discouraged   # Seasonal allergies # Atopic dermatitis Continued home [place]are   #Hypertension Will continue Clonidine PRN for SBP >160 or DBP >110 and monitor Continue Amlodipine 10 mg as blood pressure continues to be elevated  PRN medications:  acetaminophen, alum & mag hydroxide-simeth, haloperidol lactate **AND** LORazepam **AND** diphenhydrAMINE, docusate sodium, haloperidol **AND** LORazepam, magnesium hydroxide, traZODone  Pertinent Labs: no new labs ordered today  Consults: No new consults placed since yesterday    Discharge Planning: -- Social work and case management to assist with discharge planning and identification of hospital follow-up needs prior to discharge             -- Discharge Concerns: Need to establish a safety plan; Medication compliance and effectiveness             -- Discharge Goals: Return home with outpatient referrals for mental health follow-up including medication management/psychotherapy Patient unable to return home to parents - recommend guardianship at this time Patient has been served 50b since [place] admission   Total Time Spent in Direct Patient Care:  I personally spent 35 minutes on the unit in direct patient care. The direct patient care time included face-to-face time with the patient, reviewing the patient's chart, communicating with other professionals, and coordinating care. Greater than 50% of this time was spent in counseling or coordinating care with the patient regarding goals of hospitalization, psycho-education, and discharge planning needs.    Larita Fife, MD 06/27/2022, 2:59 PM

## 2022-06-27 NOTE — Progress Notes (Signed)

## 2022-06-27 NOTE — Group Note (Signed)
  [facility] LCSW Group Therapy Note  Date/Time:  06/27/2022   Type of Therapy and Topic:  Group Therapy:  Feelings About Hospitalization  Participation Level:  Did Not Attend   Description of Group This process group involved patients discussing their feelings related to being hospitalized, as well as the benefits they see to being in the hospital.  These feelings and benefits were itemized.  The group then brainstormed specific ways in which they could seek those same benefits when they discharge and return home.  Therapeutic Goals Patient will identify and describe positive and negative feelings related to hospitalization Patient will verbalize benefits of hospitalization to themselves personally Patients will brainstorm together ways they can obtain similar benefits in the outpatient setting, identify barriers to wellness and possible solutions  Summary of Patient Progress:  The patient did not attend group.  Therapeutic Modalities Cognitive Behavioral Therapy Motivational [place], [place] 06/27/2022, 11:42 AM

## 2022-06-28 DIAGNOSIS — F250 Schizoaffective disorder, bipolar type: Secondary | ICD-10-CM | POA: Diagnosis not present

## 2022-06-28 NOTE — Progress Notes (Signed)
Adult Psychoeducational Group Note  Date:  06/28/2022 Time:  9:04 PM  Group Topic/Focus:  Wrap-Up Group:   The focus of this group is to help patients review their daily goal of treatment and discuss progress on daily workbooks.  Participation Level:  Did Not Attend  Participation Quality:   Did not attend  Affect:   Did not attend  Cognitive:   Did not attend  Insight: None  Engagement in Group:   Did not attend  Modes of Intervention:   Did not attend  Additional Comments:  Pt notified at start of group but did not attend  [name], [name] 06/28/2022, 9:04 PM

## 2022-06-28 NOTE — Group Note (Signed)
[facility], [place] LCSW Group Therapy Note   Group Date: 06/28/2022 Start Time: 1300 End Time: 1400   Type of Therapy and Topic: Group Therapy: Avoiding Self-Sabotaging and Enabling Behaviors  Participation Level: Active  Description of Group:  In this group, patients will learn how to identify obstacles, self-sabotaging and enabling behaviors, as well as: what are they, why do we do them and what needs these behaviors meet. Discuss unhealthy relationships and how to have positive healthy boundaries with those that sabotage and enable. Explore aspects of self-sabotage and enabling in yourself and how to limit these self-destructive behaviors in everyday life.   Therapeutic Goals: 1. Patient will identify one obstacle that relates to self-sabotage and enabling behaviors 2. Patient will identify one personal self-sabotaging or enabling behavior they did prior to admission 3. Patient will state a plan to change the above identified behavior 4. Patient will demonstrate ability to communicate their needs through discussion and/or role play.    Summary of Patient Progress: Patient was present for the entirety of the group session. Patient was an active listener and participated in the topic of discussion, provided helpful advice to others, and added nuance to topic of conversation. Patient shared a situation in where he was able to challenge his thoughts and emotions which resulted in him reacting to the situation appropriately.   Therapeutic Modalities:  Cognitive Behavioral Therapy Person-Centered Therapy Motivational Interviewing    Durenda Hurt, [place]
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
## 2022-06-28 NOTE — Progress Notes (Signed)

## 2022-06-28 NOTE — Progress Notes (Signed)
   06/28/22 2100  Psych Admission Type (Psych Patients Only)  Admission Status Involuntary  Psychosocial Assessment  Patient Complaints Isolation  Eye Contact Fair  Facial Expression Animated  Affect Depressed  Speech Logical/coherent  Interaction Assertive  Motor Activity Slow  Appearance/Hygiene Improved  Behavior Characteristics Cooperative;Appropriate to situation  Mood Pleasant  Thought Process  Coherency WDL  Content WDL  Delusions None reported or observed  Perception WDL  Hallucination None reported or observed  Judgment Poor  Confusion None  Danger to Self  Current suicidal ideation? Denies  Danger to Others  Danger to Others None reported or observed

## 2022-06-29 ENCOUNTER — Encounter (HOSPITAL_COMMUNITY): Payer: Self-pay

## 2022-06-29 DIAGNOSIS — F250 Schizoaffective disorder, bipolar type: Secondary | ICD-10-CM | POA: Diagnosis not present

## 2022-06-29 LAB — CBC WITH DIFFERENTIAL/PLATELET
Abs Immature Granulocytes: 0.05 10*3/uL (ref 0.00–0.07)
Basophils Absolute: 0 10*3/uL (ref 0.0–0.1)
Basophils Relative: 0 %
Eosinophils Absolute: 0.1 10*3/uL (ref 0.0–0.5)
Eosinophils Relative: 1 %
HCT: 41.6 % (ref 39.0–52.0)
Hemoglobin: 14.1 g/dL (ref 13.0–17.0)
Immature Granulocytes: 1 %
Lymphocytes Relative: 32 %
Lymphs Abs: 2.4 10*3/uL (ref 0.7–4.0)
MCH: 28.4 pg (ref 26.0–34.0)
MCHC: 33.9 g/dL (ref 30.0–36.0)
MCV: 83.7 fL (ref 80.0–100.0)
Monocytes Absolute: 0.5 10*3/uL (ref 0.1–1.0)
Monocytes Relative: 7 %
Neutro Abs: 4.4 10*3/uL (ref 1.7–7.7)
Neutrophils Relative %: 59 %
Platelets: 261 10*3/uL (ref 150–400)
RBC: 4.97 MIL/uL (ref 4.22–5.81)
RDW: 13.7 % (ref 11.5–15.5)
WBC: 7.5 10*3/uL (ref 4.0–10.5)
nRBC: 0 % (ref 0.0–0.2)

## 2022-06-29 LAB — HEPATIC FUNCTION PANEL
ALT: 43 U/L (ref 0–44)
AST: 23 U/L (ref 15–41)
Albumin: 4.2 g/dL (ref 3.5–5.0)
Alkaline Phosphatase: 78 U/L (ref 38–126)
Bilirubin, Direct: <0.1 mg/dL (ref 0.0–0.2)
Total Bilirubin: 0.5 mg/dL (ref 0.3–1.2)
Total Protein: 8 g/dL (ref 6.5–8.1)

## 2022-06-29 LAB — VALPROIC ACID LEVEL: Valproic Acid Lvl: 43 ug/mL — ABNORMAL LOW (ref 50.0–100.0)

## 2022-06-29 NOTE — BH IP Treatment Plan (Signed)
Interdisciplinary Treatment and Diagnostic Plan Update  06/29/2022 Time of Session: 0830 MASSEY RUHLAND MRN: [id number]  Principal Diagnosis: Schizoaffective disorder, bipolar type [facility])  Secondary Diagnoses: Principal Problem:   Schizoaffective disorder, bipolar type (HCC) Active Problems:   Cannabis use disorder   OSA (obstructive sleep apnea)   Prediabetes   Tobacco use disorder   Current Medications:  Current Facility-Administered Medications  Medication Dose Route Frequency Provider Last Rate Last Admin   acetaminophen (TYLENOL) tablet 650 mg  650 mg Oral Q6H PRN Ajibola, Ene A, NP       alum & mag hydroxide-simeth (MAALOX/MYLANTA) 200-200-20 MG/5ML suspension 30 mL  30 mL Oral Q4H PRN Ajibola, Ene A, NP       amLODipine (NORVASC) tablet 10 mg  10 mg Oral Daily Park Pope, MD   10 mg at 06/29/22 0759   atorvastatin (LIPITOR) tablet 20 mg  20 mg Oral QHS Princess Bruins, DO   20 mg at 06/28/22 2036   benztropine (COGENTIN) tablet 0.5 mg  0.5 mg Oral BID Park Pope, MD   0.5 mg at 06/29/22 0758   haloperidol lactate (HALDOL) injection 5 mg  5 mg Intramuscular Q6H PRN Park Pope, MD       And   LORazepam (ATIVAN) injection 2 mg  2 mg Intramuscular Q6H PRN Park Pope, MD       And   diphenhydrAMINE (BENADRYL) injection 50 mg  50 mg Intravenous Q6H PRN Park Pope, MD       divalproex (DEPAKOTE ER) 24 hr tablet 1,000 mg  1,000 mg Oral QHS Mason Jim, Amy E, MD   1,000 mg at 06/28/22 2037   docusate sodium (COLACE) capsule 100 mg  100 mg Oral Daily PRN Comer Locket, MD       haloperidol (HALDOL) tablet 10 mg  10 mg Oral Q1200 Park Pope, MD   10 mg at 06/29/22 0800   haloperidol (HALDOL) tablet 15 mg  15 mg Oral Seabron Spates, MD   15 mg at 06/28/22 2036   haloperidol (HALDOL) tablet 5 mg  5 mg Oral Q8H PRN Park Pope, MD       And   LORazepam (ATIVAN) tablet 1 mg  1 mg Oral Q8H PRN Park Pope, MD       magnesium hydroxide (MILK OF MAGNESIA) suspension 30 mL  30 mL Oral Daily  PRN Ajibola, Ene A, NP       montelukast (SINGULAIR) tablet 10 mg  10 mg Oral QHS Princess Bruins, DO   10 mg at 06/28/22 2037   multivitamin with minerals tablet 1 tablet  1 tablet Oral Daily Comer Locket, MD   1 tablet at 06/29/22 0759   thiamine (Vitamin B-1) tablet 100 mg  100 mg Oral Daily Mason Jim, Amy E, MD   100 mg at 06/29/22 0800   traZODone (DESYREL) tablet 50 mg  50 mg Oral QHS PRN Comer Locket, MD   50 mg at 06/28/22 2039   PTA Medications: Medications Prior to Admission  Medication Sig Dispense Refill Last Dose   OLANZapine (ZYPREXA) 10 MG tablet Take 1 tablet (10 mg total) by mouth at bedtime. 30 tablet 2 Past Month   risperiDONE (RISPERDAL) 3 MG tablet Take 3 mg by mouth 2 (two) times daily.   Past Month    Patient Stressors: Marital or family conflict   Medication change or noncompliance   Occupational concerns   Substance abuse    Patient Strengths: Active sense of humor  Physical Health  Work skills   Treatment Modalities: Medication Management, Group therapy, Case management,  1 to 1 session with clinician, Psychoeducation, Recreational therapy.   Physician Treatment Plan for Primary Diagnosis: Schizoaffective disorder, bipolar type (HCC) Long Term Goal(s):     Short Term Goals:    Medication Management: Evaluate patient's response, side effects, and tolerance of medication regimen.  Therapeutic Interventions: 1 to 1 sessions, Unit Group sessions and Medication administration.  Evaluation of Outcomes: Progressing  Physician Treatment Plan for Secondary Diagnosis: Principal Problem:   Schizoaffective disorder, bipolar type (HCC) Active Problems:   Cannabis use disorder   OSA (obstructive sleep apnea)   Prediabetes   Tobacco use disorder  Long Term Goal(s):     Short Term Goals:       Medication Management: Evaluate patient's response, side effects, and tolerance of medication regimen.  Therapeutic Interventions: 1 to 1 sessions, Unit Group  sessions and Medication administration.  Evaluation of Outcomes: Progressing   RN Treatment Plan for Primary Diagnosis: Schizoaffective disorder, bipolar type (HCC) Long Term Goal(s): Knowledge of disease and therapeutic regimen to maintain health will improve  Short Term Goals: Ability to remain free from injury will improve, Ability to verbalize frustration and anger appropriately will improve, Ability to demonstrate self-control, Ability to participate in decision making will improve, Ability to verbalize feelings will improve, Ability to disclose and discuss suicidal ideas, and Ability to identify and develop effective coping behaviors will improve  Medication Management: RN will administer medications as ordered by provider, will assess and evaluate patient's response and provide education to patient for prescribed medication. RN will report any adverse and/or side effects to prescribing provider.  Therapeutic Interventions: 1 on 1 counseling sessions, Psychoeducation, Medication administration, Evaluate responses to treatment, Monitor vital signs and CBGs as ordered, Perform/monitor CIWA, COWS, AIMS and Fall Risk screenings as ordered, Perform wound care treatments as ordered.  Evaluation of Outcomes: Progressing   LCSW Treatment Plan for Primary Diagnosis: Schizoaffective disorder, bipolar type (HCC) Long Term Goal(s): Safe transition to appropriate next level of care at discharge, Engage patient in therapeutic group addressing interpersonal concerns.  Short Term Goals: Engage patient in aftercare planning with referrals and resources, Increase social support, Increase ability to appropriately verbalize feelings, Increase emotional regulation, Facilitate acceptance of mental health diagnosis and concerns, Facilitate patient progression through stages of change regarding substance use diagnoses and concerns, Identify triggers associated with mental health/substance abuse issues, and  Increase skills for wellness and recovery  Therapeutic Interventions: Assess for all discharge needs, 1 to 1 time with Social worker, Explore available resources and support systems, Assess for adequacy in community support network, Educate family and significant other(s) on suicide prevention, Complete Psychosocial Assessment, Interpersonal group therapy.  Evaluation of Outcomes: Progressing   Progress in Treatment: Attending groups: Yes. Participating in groups: Yes. Taking medication as prescribed: Yes. Toleration medication: Yes. Family/Significant other contact made: Yes, individual(s) contacted:  Lennin Osmond, [phone]  Patient understands diagnosis: No. Discussing patient identified problems/goals with staff: Yes. Medical problems stabilized or resolved: Yes. Denies suicidal/homicidal ideation: Yes. Issues/concerns per patient self-inventory: Yes. Other: none  New problem(s) identified: No, Describe:  none  New Short Term/Long Term Goal(s): Patient to work towards detox, elimination of symptoms of psychosis, medication management for mood stabilization; development of comprehensive mental wellness/sobriety plan.  Patient Goals:  No additional goals identified at this time. Patient to continue to work towards original goals identified in initial treatment team meeting. CSW will remain available to patient should they  voice additional treatment goals.   Discharge Plan or Barriers: DSS currently pursing guardianship of patient, details TBD. CSW will continue to stay up to date on guardianship hearing.   Reason for Continuation of Hospitalization: Other; describe psychosis   Estimated Length of Stay: 1-7 days   Scribe for Treatment Team: Larose Kells 06/29/2022 10:35 AM

## 2022-06-29 NOTE — Progress Notes (Signed)
Patient denies SI/HI but endorses AVH. States that he hears the spirits and they are "checking up on me." Reports [facility] of "dead white people." Patient presents with poor hygiene as evidenced by body odor. Per night shift RN, patient refused morning labs and the cafeteria for breakfast. Patient administered morning medications without incident and adverse effects. Patient provided active listening and reassurance. Patient encouraged to come to staff with any concerns or needs. Patient verbalized understanding.

## 2022-06-29 NOTE — Group Note (Signed)
LCSW Group Therapy Note  Group Date: 06/29/2022 Start Time: 1300 End Time: 1400   Type of Therapy and Topic:  Group Therapy - How To Cope with Nervousness about Discharge   Participation Level:  Active   Description of Group This process group involved identification of patients' feelings about discharge. Some of them are scheduled to be discharged soon, while others are new admissions, but each of them was asked to share thoughts and feelings surrounding discharge from the hospital. One common theme was that they are excited at the prospect of going home, while another was that many of them are apprehensive about sharing why they were hospitalized. Patients were given the opportunity to discuss these feelings with their peers in preparation for discharge.  Therapeutic Goals  Patient will identify their overall feelings about pending discharge. Patient will think about how they might proactively address issues that they believe will once again arise once they get home (i.e. with parents). Patients will participate in discussion about having hope for change.   Summary of Patient Progress:  Gregory Saunders was very active throughout the session. He demonstrated good insight into the subject matter, and proved open to input from peers and feedback from [place]. He was respectful of peers and participated throughout the entire session.   Therapeutic Modalities Cognitive Behavioral Therapy   Gregory Conch, LCSW 06/29/2022  2:02 PM

## 2022-06-29 NOTE — [facility] Group Notes (Signed)
Patient did not attend the Wrap-up group. 

## 2022-06-29 NOTE — Group Note (Signed)
Recreation Therapy Group Note   Group Topic:Health and Wellness  Group Date: 06/29/2022 Start Time: 1005 End Time: 1038 Facilitators: Briena Swingler-McCall, LRT,CTRS Location: 500 Hall Dayroom   Goal Area(s) Addresses:  Patient will verbalize benefit of exercise during group session. Patient will verbalize an exercise that can be completed in their hospital room during admission. Patient will acknowledge benefits of exercise when used as a coping mechanism.   Group Description: Exercise.  LRT and patients discussed the importance of exercise in day to day living.  LRT led patients in a series of stretches to get loose before the actual exercises.  Patients then took turns leading the group in the exercises of their choosing.  LRT and patients were going for at least 30 minutes of exercise.  Patients were encouraged to take breaks and get water when needed.     Affect/Mood: N/A   Participation Level: Did not attend    Clinical Observations/Individualized Feedback:     Plan: Continue to engage patient in RT group sessions 2-3x/week.   [name], LRT,CTRS 06/29/2022 12:17 PM
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
## 2022-06-30 DIAGNOSIS — F250 Schizoaffective disorder, bipolar type: Secondary | ICD-10-CM | POA: Diagnosis not present

## 2022-06-30 DIAGNOSIS — E785 Hyperlipidemia, unspecified: Secondary | ICD-10-CM

## 2022-06-30 DIAGNOSIS — I10 Essential (primary) hypertension: Secondary | ICD-10-CM

## 2022-06-30 NOTE — NC FL2 (Deleted)
[place] LEVEL OF CARE SCREENING TOOL     IDENTIFICATION  Patient Name: Gregory Saunders Birthdate: 1996-06-21 Sex: male Admission Date (Current Location): 06/13/2022  [place] and [place] Number:  Gregory Saunders [id number] [place] and Address:  [facility] [place]. [facility], [place] [address] [place], [place] [address]      Provider Number: [id number]  Attending Physician Name and Address:  Harlow Asa, MD  Relative Name and Phone Number:  [name]- mother, [phone]    Current Level of Care: Hospital Recommended Level of Care: Other (Comment) (Group Home) Prior Approval Number:    Date Approved/Denied:   PASRR Number:    Discharge Plan: Other (Comment) (Group Home)    Current Diagnoses: Patient Active Problem List   Diagnosis Date Noted   Hypothyroidism 06/14/2022   Prediabetes 06/14/2022   Tobacco use disorder 06/14/2022   OSA (obstructive sleep apnea) 01/12/2022   Schizoaffective disorder, bipolar type ([place]) 10/16/2020   Cannabis use disorder 10/16/2020    Orientation RESPIRATION BLADDER Height & Weight     Self, Time, Situation, Place  Normal  (N/A) Weight: 130.2 kg Height:  6\' 2"  (188 cm)  BEHAVIORAL SYMPTOMS/MOOD NEUROLOGICAL BOWEL NUTRITION STATUS  Other (Comment) (N/A)  (N/A)  (N/A) Diet (Normal: No dietary restrictions)  AMBULATORY STATUS COMMUNICATION OF NEEDS Skin   Independent Verbally Normal                       Personal Care Assistance Level of Assistance  Bathing, Feeding, Dressing Bathing Assistance: Independent Feeding assistance: Independent Dressing Assistance: Independent     Functional Limitations Info  Sight, Hearing, Speech Sight Info: Adequate Hearing Info: Adequate Speech Info: Adequate    SPECIAL CARE FACTORS FREQUENCY                       Contractures Contractures Info: Not present    Additional Factors Info  Code Status Code Status Info: Full             Current  Medications (06/30/2022):  This is the current hospital active medication list Current Facility-Administered Medications  Medication Dose Route Frequency Provider Last Rate Last Admin   acetaminophen (TYLENOL) tablet 650 mg  650 mg Oral Q6H PRN Ajibola, Ene A, NP       alum & mag hydroxide-simeth (MAALOX/MYLANTA) 200-200-20 MG/5ML suspension 30 mL  30 mL Oral Q4H PRN Ajibola, Ene A, NP       amLODipine (NORVASC) tablet 10 mg  10 mg Oral Daily France Ravens, MD   10 mg at 06/30/22 0813   atorvastatin (LIPITOR) tablet 20 mg  20 mg Oral QHS Merrily Brittle, DO   20 mg at 06/29/22 2110   benztropine (COGENTIN) tablet 0.5 mg  0.5 mg Oral BID France Ravens, MD   0.5 mg at 06/30/22 0816   haloperidol lactate (HALDOL) injection 5 mg  5 mg Intramuscular Q6H PRN France Ravens, MD       And   LORazepam (ATIVAN) injection 2 mg  2 mg Intramuscular Q6H PRN France Ravens, MD       And   diphenhydrAMINE (BENADRYL) injection 50 mg  50 mg Intravenous Q6H PRN France Ravens, MD       divalproex (DEPAKOTE ER) 24 hr tablet 1,000 mg  1,000 mg Oral QHS Nelda Marseille, Amy E, MD   1,000 mg at 06/29/22 2110   docusate sodium (COLACE) capsule 100 mg  100 mg Oral Daily PRN Viann Fish  E, MD       haloperidol (HALDOL) tablet 10 mg  10 mg Oral Q1200 Park Pope, MD   10 mg at 06/30/22 0815   haloperidol (HALDOL) tablet 15 mg  15 mg Oral Seabron Spates, MD   15 mg at 06/29/22 2110   haloperidol (HALDOL) tablet 5 mg  5 mg Oral Q8H PRN Park Pope, MD       And   LORazepam (ATIVAN) tablet 1 mg  1 mg Oral Q8H PRN Park Pope, MD       magnesium hydroxide (MILK OF MAGNESIA) suspension 30 mL  30 mL Oral Daily PRN Ajibola, Ene A, NP       montelukast (SINGULAIR) tablet 10 mg  10 mg Oral QHS Princess Bruins, DO   10 mg at 06/29/22 2110   multivitamin with minerals tablet 1 tablet  1 tablet Oral Daily Comer Locket, MD   1 tablet at 06/30/22 0816   thiamine (Vitamin B-1) tablet 100 mg  100 mg Oral Daily Mason Jim, Amy E, MD   100 mg at 06/30/22 0813    traZODone (DESYREL) tablet 50 mg  50 mg Oral QHS PRN Comer Locket, MD   50 mg at 06/28/22 2039     Discharge Medications: Please see discharge summary for a list of discharge medications.  Relevant Imaging Results:  Relevant Lab Results:   Additional Information    Tamora Huneke E Carlin Attridge, LCSW

## 2022-06-30 NOTE — BHH Counselor (Signed)
B[facility]/BMU LCSW Progress Note   06/30/2022    11:18 AM  [facility]EN [facility]   [phone]   Type of Contact and Topic:  Group Home Contact  CSW was provided with phone number from mother to [facility] coordinator that can assist with supportive housing and other services.  CSW provided number to patient and requested that he call provider to participate in a screener.  Contact below:   Merry Proud from [place] supportive housing: [phone]    Signed:  Riki Altes, MSW, LCSW, LCAS 06/30/2022 11:18 AM

## 2022-06-30 NOTE — NC FL2 (Addendum)
[place] MEDICAID FL2 LEVEL OF CARE SCREENING TOOL     IDENTIFICATION  Patient Name: Gregory Saunders Birthdate: 1996-04-26 Sex: male Admission Date (Current Location): 06/13/2022  [place] and [place] Number:  [name] [id number] Facility and Address:  [facility] [place]. [facility], [address], [place], [place] [address]      Provider Number: [id number]  Attending Physician Name and Address:  Sarita Bottom, MD  Relative Name and Phone Number:  Teven Mittman- mother, [phone]    Current Level of Care: Hospital Recommended Level of Care: Other (Comment) (Group Home) Prior Approval Number:    Date Approved/Denied:   PASRR Number:    Discharge Plan: Other (Comment) (Group Home)    Current Diagnoses: Patient Active Problem List   Diagnosis Date Noted   Essential hypertension 06/30/2022   Hyperlipidemia 06/30/2022   Hypothyroidism 06/14/2022   Prediabetes 06/14/2022   Tobacco use disorder 06/14/2022   OSA (obstructive sleep apnea) 01/12/2022   Schizoaffective disorder, bipolar type ([facility]) 10/16/2020   Cannabis use disorder 10/16/2020    Orientation RESPIRATION BLADDER Height & Weight     Self, Time, Situation, Place  Normal  (N/A) Weight: 130.2 kg Height:  6\' 2"  (188 cm)  BEHAVIORAL SYMPTOMS/MOOD NEUROLOGICAL BOWEL NUTRITION STATUS  Other (Comment) (N/A)  (N/A)  (N/A) Diet (Normal: No dietary restrictions)  AMBULATORY STATUS COMMUNICATION OF NEEDS Skin   Independent Verbally Normal                       Personal Care Assistance Level of Assistance  Bathing, Feeding, Dressing Bathing Assistance: Independent Feeding assistance: Independent Dressing Assistance: Independent     Functional Limitations Info  Sight, Hearing, Speech Sight Info: Adequate Hearing Info: Adequate Speech Info: Adequate    SPECIAL CARE FACTORS FREQUENCY                       Contractures Contractures Info: Not present    Additional Factors Info  Code  Status Code Status Info: Full             Current Medications (06/30/2022):  This is the current hospital active medication list Current Facility-Administered Medications  Medication Dose Route Frequency Provider Last Rate Last Admin   acetaminophen (TYLENOL) tablet 650 mg  650 mg Oral Q6H PRN Ajibola, Ene A, NP       alum & mag hydroxide-simeth (MAALOX/MYLANTA) 200-200-20 MG/5ML suspension 30 mL  30 mL Oral Q4H PRN Ajibola, Ene A, NP       amLODipine (NORVASC) tablet 10 mg  10 mg Oral Daily 08/30/2022, MD   10 mg at 06/30/22 0813   atorvastatin (LIPITOR) tablet 20 mg  20 mg Oral QHS 08/30/22, DO   20 mg at 06/29/22 2110   benztropine (COGENTIN) tablet 0.5 mg  0.5 mg Oral BID 2111, MD   0.5 mg at 06/30/22 0816   haloperidol lactate (HALDOL) injection 5 mg  5 mg Intramuscular Q6H PRN 08/30/22, MD       And   LORazepam (ATIVAN) injection 2 mg  2 mg Intramuscular Q6H PRN Park Pope, MD       And   diphenhydrAMINE (BENADRYL) injection 50 mg  50 mg Intravenous Q6H PRN Park Pope, MD       divalproex (DEPAKOTE ER) 24 hr tablet 1,000 mg  1,000 mg Oral QHS Park Pope, Amy E, MD   1,000 mg at 06/29/22 2110   docusate sodium (COLACE) capsule 100 mg  100 mg Oral Daily PRN Harlow Asa, MD       haloperidol (HALDOL) tablet 10 mg  10 mg Oral Q1200 France Ravens, MD   10 mg at 06/30/22 0815   haloperidol (HALDOL) tablet 15 mg  15 mg Oral Aliene Altes, MD   15 mg at 06/29/22 2110   haloperidol (HALDOL) tablet 5 mg  5 mg Oral Q8H PRN France Ravens, MD       And   LORazepam (ATIVAN) tablet 1 mg  1 mg Oral Q8H PRN France Ravens, MD       magnesium hydroxide (MILK OF MAGNESIA) suspension 30 mL  30 mL Oral Daily PRN Ajibola, Ene A, NP       montelukast (SINGULAIR) tablet 10 mg  10 mg Oral QHS Merrily Brittle, DO   10 mg at 06/29/22 2110   multivitamin with minerals tablet 1 tablet  1 tablet Oral Daily Harlow Asa, MD   1 tablet at 06/30/22 0816   thiamine (Vitamin B-1) tablet 100 mg  100 mg Oral  Daily Nelda Marseille, Amy E, MD   100 mg at 06/30/22 0813   traZODone (DESYREL) tablet 50 mg  50 mg Oral QHS PRN Harlow Asa, MD   50 mg at 06/28/22 2039     Discharge Medications: Please see discharge summary for a list of discharge medications.  Relevant Imaging Results:  Relevant Lab Results:   Additional [place], LCSW

## 2022-06-30 NOTE — Progress Notes (Signed)
   06/29/22 2300  Psych Admission Type (Psych Patients Only)  Admission Status Involuntary  Psychosocial Assessment  Patient Complaints Isolation  Eye Contact Fair  Facial Expression Animated  Affect Depressed  Speech Logical/coherent  Interaction Assertive  Motor Activity Slow  Appearance/Hygiene Body odor;Poor hygiene  Behavior Characteristics Appropriate to situation  Mood Pleasant  Thought Process  Coherency WDL  Content WDL  Delusions None reported or observed  Perception WDL  Hallucination None reported or observed  Judgment Limited  Confusion None  Danger to Self  Current suicidal ideation? Denies  Danger to Others  Danger to Others None reported or observed

## 2022-06-30 NOTE — [facility] Group Notes (Signed)
Patient did not attend the Wrap-up group. 

## 2022-06-30 NOTE — Progress Notes (Signed)
Patient denies SI/HI and pain but endorses AVH. Patient reports good sleep and that he showered last night. Morning meds were administered without incident. Patient remains isolative with the exception of lunch in the cafeteria. No adverse effects from meds noted. Safety checks remain and patient remains safe at this time.

## 2022-06-30 NOTE — B[facility] Counselor (Signed)
[facility]U LCSW Progress Note   06/30/2022    1:54 PM  Gregory Saunders   [id number]   Type of Contact and Topic:  FL2 and group homes  CSW completed an FL2 form for physician to review and sign to start process of looking for a group home.  Notified physician.  Patient consented to group home placement.   CSW spoke to patient mother, Gregory Saunders and informed that number was provided to patient for services through [facility].  Mother explained that she did not think they can provide housing but can assist with the process of identifying placement.     Signed:  Riki Altes MSW, LCSW LCAS 06/30/2022 1:54 PM
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
## 2022-06-30 NOTE — Progress Notes (Signed)
Patient has been isolated in room. Was met face-to-face in room. Was guarded but able to  participate in assessment. He reported feeling depressed with intermittent hallucinations. He denied thoughts of self-harm. Patient did not participate in any evening activities. Was not interested in eating a snack, reporting that he did not feel hungry. Patient was reserved  and isolative in room. Safety precautions reinforced.

## 2022-06-30 NOTE — Group Note (Signed)
Recreation Therapy Group Note   Group Topic:Communication  Group Date: 06/30/2022 Start Time: 1000 End Time: 1038 Facilitators: Natsumi Whitsitt-McCall, LRT,CTRS Location: [address] Dayroom   Goal Area(s) Addresses:  Patient will effectively listen to complete activity.  Patient will identify communication skills used to make activity successful.  Patient will identify how skills used during activity can be used to reach post d/c goals.     Group Description: Geometric Drawings.  Three volunteers from the peer group will be shown an abstract picture with a particular arrangement of geometrical shapes.  Each round, one 'speaker' will describe the pattern, as accurately as possible without revealing the image to the group.  The remaining group members will listen and draw the picture to reflect how it is described to them. Patients with the role of 'listener' cannot ask clarifying questions but, may request that the speaker repeat a direction. Once the drawings are complete, the presenter will show the rest of the group the picture and compare how close each person came to drawing the picture. LRT will facilitate a post-activity discussion regarding effective communication and the importance of planning, listening, and asking for clarification in daily interactions with others.   Affect/Mood: N/A   Participation Level: Did not attend    Clinical Observations/Individualized Feedback:     Plan: Continue to engage patient in RT group sessions 2-3x/week.   Ai Sonnenfeld-McCall, LRT,CTRS  06/30/2022 11:57 AM

## 2022-07-01 DIAGNOSIS — F250 Schizoaffective disorder, bipolar type: Secondary | ICD-10-CM | POA: Diagnosis not present

## 2022-07-01 NOTE — Progress Notes (Signed)
[facility] MD Progress Note  07/01/2022 1:06 PM Gregory Saunders  MRN:  [id number] Subjective:    Gregory Saunders is a 26 year old male with a past psychiatric history significant for schizoaffective disorder (bipolar type).  Patient also has a past medical history of both cannabis use disorder and tobacco use disorder.  Patient presented to [name] (06/08/2022) via foster parents due to bizarre behaviors. Patient was subsequently involuntarily admitted to [facility] (06/13/2022) for the treatment of acute psychosis in the setting of medication non-adherence and THC use.  Today, patient was seen by this provider and the patient is currently being managed on the following psychiatric medications:  Depakote 1000 mg at bedtime Benztropine 0.5 mg 2 times daily Haldol 10 mg daily Haldol 15 mg at bedtime  Patient reports no issues or concerns regarding his current psychiatric meds, he states the medications are helpful and that he is doing okay.  Patient denies depressive symptoms at this time and further denies anxiety.  Patient denies suicidal or homicidal ideations.  He further denies auditory or visual hallucinations and does not appear to be responding to internal/external stimuli.  Patient denies paranoia and feels that he is in a safe environment at this time.  Patient denies delusional thoughts or telepathy.  Patient states that he is eating well and that his sleep has been good.  Patient is currently awaiting a group home to be discharged to.  Principal Problem: Schizoaffective disorder, bipolar type (HCC) Diagnosis: Principal Problem:   Schizoaffective disorder, bipolar type (HCC) Active Problems:   Cannabis use disorder   OSA (obstructive sleep apnea)   Prediabetes   Tobacco use disorder   Essential hypertension   Hyperlipidemia  Total Time spent with patient: 15 minutes  Past Psychiatric History:  Past Medical History:  Past Medical History:  Diagnosis Date   Cannabis use disorder  10/16/2020   Schizophrenia (HCC)    Strain of right Achilles tendon 04/07/2019   Tobacco use disorder 06/14/2022    Past Surgical History:  Procedure Laterality Date   stye removal     WISDOM TOOTH EXTRACTION     Family History: History reviewed. No pertinent family history. Family Psychiatric  History:  Patient denies a family history of psychiatric illness  Social History:  Social History   Substance and Sexual Activity  Alcohol Use Never     Social History   Substance and Sexual Activity  Drug Use Not Currently   Types: Marijuana    Social History   Socioeconomic History   Marital status: Single    Spouse name: Not on file   Number of children: Not on file   Years of education: Not on file   Highest education level: Not on file  Occupational History   Not on file  Tobacco Use   Smoking status: Every Day    Packs/day: 0.50    Types: Cigarettes   Smokeless tobacco: Never  Vaping Use   Vaping Use: Every day   Substances: CBD  Substance and Sexual Activity   Alcohol use: Never   Drug use: Not Currently    Types: Marijuana   Sexual activity: Not on file  Other Topics Concern   Not on file  Social History Narrative   Not on file   Social Determinants of Health   Financial Resource Strain: Not on file  Food Insecurity: Unknown (06/14/2022)   Hunger Vital Sign    Worried About Running Out of Food in the Last Year: Patient refused  Ran Out of Food in the Last Year: Patient refused  Transportation Needs: Unknown (06/14/2022)   [place] - Hydrologist (Medical): Patient refused    Lack of Transportation (Non-Medical): Patient refused  Physical Activity: Not on file  Stress: Not on file  Social Connections: Not on file   Additional Social History:                         Sleep: Good  Appetite:  Good  Current Medications: Current Facility-Administered Medications  Medication Dose Route Frequency Provider Last Rate  Last Admin   acetaminophen (TYLENOL) tablet 650 mg  650 mg Oral Q6H PRN Ajibola, Ene A, NP       alum & mag hydroxide-simeth (MAALOX/MYLANTA) 200-200-20 MG/5ML suspension 30 mL  30 mL Oral Q4H PRN Ajibola, Ene A, NP       amLODipine (NORVASC) tablet 10 mg  10 mg Oral Daily France Ravens, MD   10 mg at 07/01/22 0818   atorvastatin (LIPITOR) tablet 20 mg  20 mg Oral QHS Merrily Brittle, DO   20 mg at 06/30/22 2043   benztropine (COGENTIN) tablet 0.5 mg  0.5 mg Oral BID France Ravens, MD   0.5 mg at 07/01/22 0817   haloperidol lactate (HALDOL) injection 5 mg  5 mg Intramuscular Q6H PRN France Ravens, MD       And   LORazepam (ATIVAN) injection 2 mg  2 mg Intramuscular Q6H PRN France Ravens, MD       And   diphenhydrAMINE (BENADRYL) injection 50 mg  50 mg Intravenous Q6H PRN France Ravens, MD       divalproex (DEPAKOTE ER) 24 hr tablet 1,000 mg  1,000 mg Oral QHS Nelda Marseille, Amy E, MD   1,000 mg at 06/30/22 2043   docusate sodium (COLACE) capsule 100 mg  100 mg Oral Daily PRN Harlow Asa, MD       haloperidol (HALDOL) tablet 10 mg  10 mg Oral Q1200 France Ravens, MD   10 mg at 07/01/22 0817   haloperidol (HALDOL) tablet 15 mg  15 mg Oral Aliene Altes, MD   15 mg at 06/30/22 2043   haloperidol (HALDOL) tablet 5 mg  5 mg Oral Q8H PRN France Ravens, MD       And   LORazepam (ATIVAN) tablet 1 mg  1 mg Oral Q8H PRN France Ravens, MD       magnesium hydroxide (MILK OF MAGNESIA) suspension 30 mL  30 mL Oral Daily PRN Ajibola, Ene A, NP       montelukast (SINGULAIR) tablet 10 mg  10 mg Oral QHS Merrily Brittle, DO   10 mg at 06/30/22 2044   multivitamin with minerals tablet 1 tablet  1 tablet Oral Daily Harlow Asa, MD   1 tablet at 07/01/22 0817   thiamine (Vitamin B-1) tablet 100 mg  100 mg Oral Daily Nelda Marseille, Amy E, MD   100 mg at 07/01/22 0818   traZODone (DESYREL) tablet 50 mg  50 mg Oral QHS PRN Harlow Asa, MD   50 mg at 06/30/22 2045    Lab Results:  Results for orders placed or performed during the  hospital encounter of 06/13/22 (from the past 48 hour(s))  CBC with Differential/Platelet     Status: None   Collection Time: 06/29/22  7:01 PM  Result Value Ref Range   WBC 7.5 4.0 - 10.5 K/uL   RBC 4.97 4.22 -  5.81 MIL/uL   Hemoglobin 14.1 13.0 - 17.0 g/dL   HCT 56.4 33.2 - 95.1 %   MCV 83.7 80.0 - 100.0 fL   MCH 28.4 26.0 - 34.0 pg   MCHC 33.9 30.0 - 36.0 g/dL   RDW 88.4 16.6 - 06.3 %   Platelets 261 150 - 400 K/uL   nRBC 0.0 0.0 - 0.2 %   Neutrophils Relative % 59 %   Neutro Abs 4.4 1.7 - 7.7 K/uL   Lymphocytes Relative 32 %   Lymphs Abs 2.4 0.7 - 4.0 K/uL   Monocytes Relative 7 %   Monocytes Absolute 0.5 0.1 - 1.0 K/uL   Eosinophils Relative 1 %   Eosinophils Absolute 0.1 0.0 - 0.5 K/uL   Basophils Relative 0 %   Basophils Absolute 0.0 0.0 - 0.1 K/uL   Immature Granulocytes 1 %   Abs Immature Granulocytes 0.05 0.00 - 0.07 K/uL    Comment: Performed at [facility], [address] [place], [place] [address]  Hepatic function panel     Status: None   Collection Time: 06/29/22  7:01 PM  Result Value Ref Range   Total Protein 8.0 6.5 - 8.1 g/dL   Albumin 4.2 3.5 - 5.0 g/dL   AST 23 15 - 41 U/L   ALT 43 0 - 44 U/L   Alkaline Phosphatase 78 38 - 126 U/L   Total Bilirubin 0.5 0.3 - 1.2 mg/dL   Bilirubin, Direct <0.9 0.0 - 0.2 mg/dL   Indirect Bilirubin NOT CALCULATED 0.3 - 0.9 mg/dL    Comment: Performed at [facility], [address], [place], [place] [address]  Valproic acid level     Status: Abnormal   Collection Time: 06/29/22  7:01 PM  Result Value Ref Range   Valproic Acid Lvl 43 (L) 50.0 - 100.0 ug/mL    Comment: Performed at [facility], [address] [place], [place] [address]    Blood Alcohol level:  Lab Results  Component Value Date   ETH <10 06/08/2022   ETH <10 06/20/2021    Metabolic Disorder Labs: Lab Results  Component Value Date   HGBA1C 5.8 (H) 06/24/2022   MPG 119.76 06/24/2022   No results  found for: "PROLACTIN" Lab Results  Component Value Date   CHOL 186 06/13/2022   TRIG 272 (H) 06/13/2022   HDL 34 (L) 06/13/2022   CHOLHDL 5.5 06/13/2022   VLDL 54 (H) 06/13/2022   LDLCALC 98 06/13/2022    Physical Findings: AIMS: Facial and Oral Movements Muscles of Facial Expression: None, normal Lips and Perioral Area: None, normal Jaw: None, normal Tongue: None, normal,Extremity Movements Upper (arms, wrists, hands, fingers): None, normal Lower (legs, knees, ankles, toes): None, normal, Trunk Movements Neck, shoulders, hips: None, normal, Overall Severity Severity of abnormal movements (highest score from questions above): None, normal Incapacitation due to abnormal movements: None, normal Patient's awareness of abnormal movements (rate only patient's report): No Awareness, Dental Status Current problems with teeth and/or dentures?: No Does patient usually wear dentures?: No  CIWA:  CIWA-Ar Total: 0 COWS:     Musculoskeletal: Strength & Muscle Tone: within normal limits Gait & Station: normal Patient leans: N/A  Psychiatric Specialty Exam:  Presentation  General Appearance:  Appropriate for Environment; Casual  Eye Contact: Fair  Speech: Clear and Coherent; Normal Rate  Speech Volume: Normal  Handedness: Right   Mood and Affect  Mood: Euthymic  Affect: Blunt   Thought Process  Thought  Processes: Coherent; Goal Directed  Descriptions of Associations:Intact  Orientation:Full (Time, Place and Person)  Thought Content:Logical  History of Schizophrenia/Schizoaffective disorder:Yes  Duration of Psychotic Symptoms:Greater than six months  Hallucinations:Hallucinations: None  Ideas of Reference:None  Suicidal Thoughts:Suicidal Thoughts: No  Homicidal Thoughts:Homicidal Thoughts: No   Sensorium  Memory: Immediate Fair; Recent Fair  Judgment: Impaired  Insight: Shallow   Executive Functions  Concentration: Fair  Attention  Span: Fair  Recall: Fis[employer]  Fund of Knowledge: Fair  Language: Fair   Psychomotor Activity  Psychomotor Activity: Psychomotor Activity: Normal   Assets  Assets: Communication Skills; Desire for Improvement; Physical Health   Sleep  Sleep: Sleep: Good    Physical Exam: Physical Exam Psychiatric:        Attention and Perception: Attention and perception normal. He does not perceive auditory or visual hallucinations.        Mood and Affect: Mood normal. Affect is blunt.        Speech: Speech normal.        Behavior: Behavior is slowed. Behavior is cooperative.        Thought Content: Thought content normal. Thought content is not paranoid or delusional. Thought content does not include homicidal or suicidal ideation.        Cognition and Memory: Cognition and memory normal.        Judgment: Judgment normal.    Review of Systems  Constitutional: Negative.   HENT: Negative.    Eyes: Negative.   Respiratory: Negative.    Cardiovascular: Negative.   Gastrointestinal: Negative.   Skin: Negative.   Neurological: Negative.   Psychiatric/Behavioral:  Negative for depression, hallucinations, substance abuse and suicidal ideas. The patient is not nervous/anxious and does not have insomnia.    Blood pressure (!) 142/107, pulse (!) 114, temperature 97.7 F (36.5 C), temperature source Oral, resp. rate 16, height 6\' 2"  (1.88 m), weight 130.2 kg, SpO2 98 %. Body mass index is 36.85 kg/m.   Treatment Plan Summary: Daily contact with patient to assess and evaluate symptoms and progress in treatment and Medication management  Safety and Monitoring: Voluntary admission to inpatient psychiatric unit for safety, stabilization and treatment Daily contact with patient to assess and evaluate symptoms and progress in treatment Patient's case to be discussed in multi-disciplinary team meeting Observation Level : q15 minute checks Vital signs: q12 hours  Schizoaffective disorder,  bipolar type -Continue haloperidol 10 mg in the morning and 15 mg at night for psychosis -Continue Cogentin 0.5 mg 2 times daily for the prevention of extrapyramidal symptoms -Continue Depakote 1000 mg at bedtime for mood stabilization -Continue agitation protocol as needed   Other medical problems Prediabetes -Patient will need follow-up with primary care provider after discharge for monitoring while on antipsychotic medication  Hypercholesterolemia -Continue atorvastatin 20 mg at bedtime  Tobacco use disorder -Continue nicotine patch 21 mg / 24 hours -Smoking cessation encouraged and illicit drug use discouraged  Cannabis use disorder -Cannabis cessation encouraged and illicit drug use discouraged  Hypertension -Will continue clonidine as needed for SBP>160 or DBP>110 -Continue amlodipine 10 mg daily for hypertension -Continue to monitor patient's blood pressure  Seasonal allergies/atopic dermatitis -Continue home Singulair  As needed medications: -Continue taking Tylenol 650 mg every 6 hours as needed for the management of mild pain -Continue taking Maalox/Mylanta 30 mL every 4 hours as needed for indigestion -Continue taking Milk of Magnesia 30 mL as needed for mild constipation -Trazodone 50 mg at bedtime as needed  Incomplete right bundle branch block  Obstructive sleep apnea -Does have h/o OSA, would benefit from CPAP. Saw sleep clinic in June 2023.  -Follow-up with PCP -Would benefit from resuming sleep clinic f/u for CPAP -Repeat EKG 9/27 NSR with Incomplete RBBB - f/u with PCP after discharge   Consults: Social work to follow up with patient to plan contact with group home for discharge planning  Discharge Planning: Social work and case management to assist with discharge planning and identification of hospital follow-up needs prior to discharge Discharge Concerns: Need to establish a safety plan; Medication compliance and effectiveness Discharge Goals: Return  home with outpatient referrals for mental health follow-up including medication management/psychotherapy  Patient unable to return home to parents - recommend guardianship at this time Patient has been served 50b since [facility] admission  Meta Hatchet, PA 07/01/2022, 1:06 PM

## 2022-07-01 NOTE — Progress Notes (Signed)
Patient did not morning orientation/goal setting group.

## 2022-07-01 NOTE — Group Note (Unsigned)
LCSW Group Therapy Note  Group Date: 07/01/2022 Start Time: 1300 End Time: 1345   Type of Therapy and Topic:  Group Therapy: Anger Cues and Responses  Participation Level:  {BHH PARTICIPATION LEVEL:22264}   Description of Group:   In this group, patients learned how to recognize the physical, cognitive, emotional, and behavioral responses they have to anger-provoking situations.  They identified a recent time they became angry and how they reacted.  They analyzed how their reaction was possibly beneficial and how it was possibly unhelpful.  The group discussed a variety of healthier coping skills that could help with such a situation in the future.  Focus was placed on how helpful it is to recognize the underlying emotions to our anger, because working on those can lead to a more permanent solution as well as our ability to focus on the important rather than the urgent.  Therapeutic Goals: 1. Patients will remember their last incident of anger and how they felt emotionally and physically, what their thoughts were at the time, and how they behaved. 2. Patients will identify how their behavior at that time worked for them, as well as how it worked against them. 3. Patients will explore possible new behaviors to use in future anger situations. 4. Patients will learn that anger itself is normal and cannot be eliminated, and that healthier reactions can assist with resolving conflict rather than worsening situations.  Summary of Patient Progress:  *** was active during the group. ***he shared a recent occurrence wherein feeling *** led to anger. ***he demonstrated *** insight into the subject matter, was respectful of peers, and participated throughout the entire session.  Therapeutic Modalities:   Cognitive Behavioral Therapy    Hermie Reagor S Gazella Anglin, LCSWA 07/01/2022  2:59 PM    

## 2022-07-01 NOTE — Group Note (Signed)
Recreation Therapy Group Note   Group Topic:Coping Skills  Group Date: 07/01/2022 Start Time: 1003 End Time: 1049 Facilitators: Zakhai Meisinger-McCall, LRT,CTRS Location: [address] Dayroom   Goal Area(s) Addresses:  Patient will identify positive coping skills techniques. Patient will identify benefits of using coping skills post d/c.  Group Description:  Mind Map.  Patient was provided a blank template of a diagram with 32 blank boxes in a tiered system, branching from the center (similar to a bubble chart). LRT directed patients to label the middle of the diagram "Coping Skills" and consider 8 different sources in coping skills could be used.  LRT and patients recorded those sources (anger, frustration, hate, anxiety, A/V hallucinations, stress, depression and grief)  in the 2nd tier boxes closest to the center. Patients were to then come up with 3 effective coping skills address each identified area in the remaining boxes. LRT would right the answers on the board so patients could see what each other came up with.   Affect/Mood: N/A   Participation Level: Did not attend    Clinical Observations/Individualized Feedback:     Plan: Continue to engage patient in RT group sessions 2-3x/week.   Estle Sabella-McCall, LRT,CTRS  07/01/2022 12:27 PM

## 2022-07-01 NOTE — BHH Counselor (Signed)
BHH/BMU LCSW Progress Note   07/01/2022    12:58 PM  Gregory Saunders   [id number]   Type of Contact and Topic:  Guardianship   CSW called DSS caseworker, Jaynee Eagles, regarding follow up with petition for guardianship and court hearing.  Ms. Saverio Danker informed this caseworker that they received letter for a court date for guardianship scheduled for October 24th.  CSW asked if they had any leads regarding group home placement or other types of placement.  DSS caseworker states until they get guardianship they would not be able to look for placement but that when they do they would contact [facility] for assistance with placement.  Caseworker believes that patient already has a representative with [facility] and agreed to call back with name and contact information when she could get it.  CSW awaiting call back and plans to contact health coordinator to assist with facilitation with placement.    Signed:  Riki Altes MSW, LCSW, LCAS 07/01/2022 12:58 PM

## 2022-07-01 NOTE — Group Note (Signed)
[facility] LCSW Group Therapy Note  Date/Time: 07/01/2022 @ 1pm  Type of Therapy and Topic:  Group Therapy:  Who Am I?  Self Esteem, Self-Actualization and Understanding Self.  Participation Level:  Did not attend  Description of Group:    In this group patients will be asked to explore values, beliefs, truths, and morals as they relate to personal self.  Patients will be guided to discuss their thoughts, feelings, and behaviors related to what they identify as important to their true self. Patients will process together how values, beliefs and truths are connected to specific choices patients make every day. Each patient will be challenged to identify changes that they are motivated to make in order to improve self-esteem and self-actualization. This group will be process-oriented, with patients participating in exploration of their own experiences as well as giving and receiving support and challenge from other group members.  Therapeutic Goals: Patient will identify false beliefs that currently interfere with their self-esteem.  Patient will identify feelings, thought process, and behaviors related to self and will become aware of the uniqueness of themselves and of others.  Patient will be able to identify and verbalize values, morals, and beliefs as they relate to self. Patient will begin to learn how to build self-esteem/self-awareness by expressing what is important and unique to them personally.  Summary of Patient Progress: Did not attend             Therapeutic Modalities:   Cognitive Behavioral Therapy Solution Focused Therapy Motivational Interviewing Brief Therapy   Dariel Betzer, LCSW, [facility]

## 2022-07-01 NOTE — Progress Notes (Signed)
   07/01/22 1000  Psych Admission Type (Psych Patients Only)  Admission Status Involuntary  Psychosocial Assessment  Patient Complaints Isolation  Eye Contact Brief  Facial Expression Flat  Affect Depressed  Speech Logical/coherent  Interaction Assertive  Motor Activity Slow  Appearance/Hygiene Body odor;Poor hygiene  Behavior Characteristics Cooperative;Appropriate to situation  Mood Sad;Pleasant  Thought Process  Coherency WDL  Content WDL  Delusions None reported or observed  Perception WDL  Hallucination None reported or observed  Judgment Limited  Confusion None  Danger to Self  Current suicidal ideation? Denies  Danger to Others  Danger to Others None reported or observed

## 2022-07-02 NOTE — BHH Counselor (Signed)
BHH/BMU LCSW Progress Note   07/02/2022    2:35 PM  Gregory Saunders   [id number]   Type of Contact and Topic: Group Home Placement  CSW called group homes in [place], [place], [place], [place] and [employer] with below results:   No Availability  [place] [facility] [place], [place] -- only taking Economist at this time [place], [place], [place], [place] - only females  [place], [place], [place], [place] [address]  [place], [place], [place]   Did not answer  [place] [facility]- [place], [place], [place], [place], [place], [place] [facility] [place], [place], [place], [place], [place] and [place], [place] Referral/Information   [place], [place]-- normally they take older individuals but are willing to look at his information [place], [place] - normally take older individuals but willing to look at referral [place] [facility], [place] - manager wanted more information about patient and requested for mother to call to provide background information, mother agreed to call group home manager Eusebio Friendly.  [place], [place] - sent referral to [email or web address]  [place] [facility]: [place] and [place], [place] - sent to [email or web address]  [place] No sent  referrals  [place] No sent referrals      Signed:  Riki Altes MSW, LCSW, LCAS 07/02/2022 2:35 PM

## 2022-07-02 NOTE — Progress Notes (Signed)
The focus of this group is to help patients review their daily goal of treatment and discuss progress on daily workbooks. Pt did not attend the evening group. 

## 2022-07-02 NOTE — Progress Notes (Signed)
Patient ID: Gregory Saunders, male   DOB: 25-Feb-1996, 26 y.o.   MRN: [id number] [facility] MD Progress Note  07/02/2022 2:10 PM PRINCETON CARNAHAN  MRN:  [id number]  Principal Problem: Schizoaffective disorder, bipolar type ([place]) Diagnosis: Principal Problem:   Schizoaffective disorder, bipolar type ([place]) Active Problems:   Cannabis use disorder   OSA (obstructive sleep apnea)   Prediabetes   Tobacco use disorder   Essential hypertension   Hyperlipidemia  HPI: Gregory Saunders is a 26 y.o., male with PMH significant for schizoaffective disorder-bipolar type, cannabis use disorder, tobacco use disorder, who presented to [facility] (06/08/2022) via parents for assaulting parents and bizarre behaviors, then admitted to [facility] (06/13/2022). Involuntary treatment of acute psychosis in the setting of medication non-compliance.   On assessment patient presents resting in bed. Oriented to person, place, and situation; partial time. Disheveled appearance. Blunt, flat affect. Minimal eye contact. No events noted overnight; minimal attendance in unit group activities noted over past few days. Continues to state readiness for discharge and being "tired" of being in the hospital. Provider explained social work was working to get everything done. He expressed frustration but verbalized an understanding and "okay, I'm just ready to go".  Chart reviewed, patient has been medication compliant; denies any side effects.    Chart reviewed. Care discussed during treatment team rounds.  Subjective:  Patient was seen today in his room for assessment where he presented laying in bed. He is alert and oriented x3; reports "I was involuntarily committed" as reason for admission. On assessment patient continues to appear blunt, flat affect; withdrawn. He is perseverative on discharge. When asked about how he feels he replies, "I want to be discharged". He denies any feelings of depression or anxiousness. States  "I'm tired of being here". He presents guarded and restricted staring away from provider majority of assessment. He denies any auditory or visual hallucinations. States his current goal is "to be discharged this weekend and go to [facility] or a group home". He reports not being able to return home for "breaking the phone" where he stated "and I have no regrets". He denies feeling paranoid, unsafe or having any thoughts or plans to hurt himself or others. He denies any physical complaints.   Labs: no new results for review.  Total Time spent with patient: 30 minutes  Past Psychiatric History: see H&P  Past Medical History:  Past Medical History:  Diagnosis Date   Cannabis use disorder 10/16/2020   Schizophrenia ([place])    Strain of right Achilles tendon 04/07/2019   Tobacco use disorder 06/14/2022    Past Surgical History:  Procedure Laterality Date   stye removal     WISDOM TOOTH EXTRACTION     Family History: History reviewed. No pertinent family history. Family Psychiatric  History: see H&P Social History:  Social History   Substance and Sexual Activity  Alcohol Use Never     Social History   Substance and Sexual Activity  Drug Use Not Currently   Types: Marijuana    Social History   Socioeconomic History   Marital status: Single    Spouse name: Not on file   Number of children: Not on file   Years of education: Not on file   Highest education level: Not on file  Occupational History   Not on file  Tobacco Use   Smoking status: Every Day    Packs/day: 0.50    Types: Cigarettes   Smokeless tobacco: Never  Vaping Use   Vaping Use: Every day   Substances: CBD  Substance and Sexual Activity   Alcohol use: Never   Drug use: Not Currently    Types: Marijuana   Sexual activity: Not on file  Other Topics Concern   Not on file  Social History Narrative   Not on file   Social Determinants of Health   Financial Resource Strain: Not on file  Food  Insecurity: Unknown (06/14/2022)   Hunger Vital Sign    Worried About Running Out of Food in the Last Year: Patient refused    [place] in the Last Year: Patient refused  Transportation Needs: Unknown (06/14/2022)   [place] - Hydrologist (Medical): Patient refused    Lack of Transportation (Non-Medical): Patient refused  Physical Activity: Not on file  Stress: Not on file  Social Connections: Not on file   Additional Social History:    Sleep: Fair  Appetite:  Good  Current Medications: Current Facility-Administered Medications  Medication Dose Route Frequency Provider Last Rate Last Admin   acetaminophen (TYLENOL) tablet 650 mg  650 mg Oral Q6H PRN Ajibola, Ene A, NP       alum & mag hydroxide-simeth (MAALOX/MYLANTA) 200-200-20 MG/5ML suspension 30 mL  30 mL Oral Q4H PRN Ajibola, Ene A, NP       amLODipine (NORVASC) tablet 10 mg  10 mg Oral Daily France Ravens, MD   10 mg at 07/02/22 0823   atorvastatin (LIPITOR) tablet 20 mg  20 mg Oral QHS Merrily Brittle, DO   20 mg at 07/01/22 2018   benztropine (COGENTIN) tablet 0.5 mg  0.5 mg Oral BID France Ravens, MD   0.5 mg at 07/02/22 [id number]   haloperidol lactate (HALDOL) injection 5 mg  5 mg Intramuscular Q6H PRN France Ravens, MD       And   LORazepam (ATIVAN) injection 2 mg  2 mg Intramuscular Q6H PRN France Ravens, MD       And   diphenhydrAMINE (BENADRYL) injection 50 mg  50 mg Intravenous Q6H PRN France Ravens, MD       divalproex (DEPAKOTE ER) 24 hr tablet 1,000 mg  1,000 mg Oral QHS Nelda Marseille, Amy E, MD   1,000 mg at 07/01/22 2019   docusate sodium (COLACE) capsule 100 mg  100 mg Oral Daily PRN Harlow Asa, MD       haloperidol (HALDOL) tablet 10 mg  10 mg Oral Q1200 France Ravens, MD   10 mg at 07/02/22 [id number]   haloperidol (HALDOL) tablet 15 mg  15 mg Oral Aliene Altes, MD   15 mg at 07/01/22 2018   haloperidol (HALDOL) tablet 5 mg  5 mg Oral Q8H PRN France Ravens, MD       And   LORazepam (ATIVAN) tablet 1 mg   1 mg Oral Q8H PRN France Ravens, MD       magnesium hydroxide (MILK OF MAGNESIA) suspension 30 mL  30 mL Oral Daily PRN Ajibola, Ene A, NP       montelukast (SINGULAIR) tablet 10 mg  10 mg Oral QHS Merrily Brittle, DO   10 mg at 07/01/22 2018   multivitamin with minerals tablet 1 tablet  1 tablet Oral Daily Harlow Asa, MD   1 tablet at 07/02/22 [id number]   thiamine (Vitamin B-1) tablet 100 mg  100 mg Oral Daily Viann Fish E, MD   100 mg at 07/02/22 0823   traZODone ([place])  tablet 50 mg  50 mg Oral QHS PRN Harlow Asa, MD   50 mg at 07/01/22 2018   Lab Results:  No results found for this or any previous visit (from the past 48 hour(s)).  Blood Alcohol level:  Lab Results  Component Value Date   ETH <10 06/08/2022   ETH <10 [id number]   Metabolic Disorder Labs: Lab Results  Component Value Date   HGBA1C 5.8 (H) 06/24/2022   MPG 119.76 06/24/2022   No results found for: "PROLACTIN" Lab Results  Component Value Date   CHOL 186 06/13/2022   TRIG 272 (H) 06/13/2022   HDL 34 (L) 06/13/2022   CHOLHDL 5.5 06/13/2022   VLDL 54 (H) 06/13/2022   LDLCALC 98 06/13/2022   Physical Findings: AIMS: Facial and Oral Movements Muscles of Facial Expression: None, normal Lips and Perioral Area: None, normal Jaw: None, normal Tongue: None, normal,Extremity Movements Upper (arms, wrists, hands, fingers): None, normal Lower (legs, knees, ankles, toes): None, normal, Trunk Movements Neck, shoulders, hips: None, normal, Overall Severity Severity of abnormal movements (highest score from questions above): None, normal Incapacitation due to abnormal movements: None, normal Patient's awareness of abnormal movements (rate only patient's report): No Awareness, Dental Status Current problems with teeth and/or dentures?: No Does patient usually wear dentures?: No  CIWA:  CIWA-Ar Total: 0 COWS:     Musculoskeletal: Strength & Muscle Tone: within normal limits Gait & Station: normal Patient  leans: N/A  Psychiatric Specialty Exam:  Appearance:  AAM, appearing stated age,  wearing appropriate to the situation casual/hospital clothes, with decreased grooming and hygiene. Normal level of alertness.  Attitude/Behavior: calm, cooperative, guarded, engaging with variable eye contact.  Motor: WNL; dyskinesias not evident.   Speech: spontaneous, clear, coherent, normal comprehension.  Mood: dysthymic, " I want to be discharged".  Affect: blunted, flat.  Thought process: patient appears coherent, concrete but linear with questions  Thought content: patient denies suicidal thoughts, denies homicidal thoughts; did not express any delusions to me today, although per chart yesterday was "paranoid; illogical belief he is a voodoo doll and vampire; reports AVH; denies ideas of reference; has belief in telekinesis".  Thought perception: patient denies auditory and visual hallucinations. Did not appear internally stimulated today.  Cognition: patient is alert and oriented in self, place, date.  Insight: poor  Judgement: poor  Assets: Armed forces logistics/support/administrative officer; Desire for Improvement; Resilience; Physical Health; Social Support   Sleep  Sleep:Sleep: Good    Physical Exam: Physical Exam Vitals and nursing note reviewed.  Constitutional:      General: He is not in acute distress.    Appearance: He is obese. He is not ill-appearing.  HENT:     Head: Normocephalic.     Nose: Nose normal.     Mouth/Throat:     Mouth: Mucous membranes are moist.     Pharynx: Oropharynx is clear.  Eyes:     Pupils: Pupils are equal, round, and reactive to light.  Cardiovascular:     Rate and Rhythm: Normal rate.     Pulses: Normal pulses.  Pulmonary:     Effort: Pulmonary effort is normal.  Abdominal:     Palpations: Abdomen is soft.  Musculoskeletal:        General: Normal range of motion.     Cervical back: Normal range of motion.  Skin:    General: Skin is dry.  Neurological:      Mental Status: He is oriented to person, place, and time.  Psychiatric:  Attention and Perception: He is inattentive. He does not perceive auditory hallucinations.        Mood and Affect: Affect is blunt and flat.        Speech: He is noncommunicative.        Behavior: Behavior is withdrawn.        Thought Content: Thought content does not include homicidal or suicidal ideation. Thought content does not include homicidal or suicidal plan.        Cognition and Memory: Cognition and memory normal.    Review of Systems  Psychiatric/Behavioral:  Negative for hallucinations and suicidal ideas.   All other systems reviewed and are negative.  Blood pressure (!) 150/99, pulse (!) 103, temperature 98.4 F (36.9 C), temperature source Oral, resp. rate 16, height 6\' 2"  (1.88 m), weight 130.2 kg, SpO2 99 %. Body mass index is 36.85 kg/m.  Physical/General: alert, NAD. Skin: no rashes. HEENT:  Normocephalic, atraumatic, PERRLA.  Trunk/Extremities: no gross abnormalities evident Pulmonary: Pulmonary effort is normal.  Neuro: grossly non-focal, dyskinesias not evident, gait appears in full range. Mental Status: He is alert.  Other Medical: N/A  Vitals and nursing note reviewed.    Review of Systems: Respiratory:  Negative for shortness of breath.   Cardiovascular:  Negative for chest pain.  Gastrointestinal:  Negative for diarrhea, nausea and vomiting.  Neurological:  Negative for dizziness and headaches.     Treatment Plan Summary: Daily contact with patient to assess and evaluate symptoms and progress in treatment and Medication management  Diagnoses/ Active problems: -Schizoaffective, bipolar type  PLAN:  Safety and Monitoring: continue inpatient psych admission; 15-minute checks; daily contact with patient to assess and evaluate symptoms and progress in treatment; psychoeducation.Vital signs: q12 hours. Precautions: suicide, elopement, and assault. Placed on room lock out for  meals, snacks and groups.  Schizoaffective disorder, bipolar type: continue Haloperidol to 10 mg in AM and 15 mg at night for continued psychotic symptoms Continue Cogentin 0.5 mg bid for EPS prophylaxis  Metabolic profile and EKG monitoring obtained while on an atypical antipsychotic (BMI 36.83 Lipid Panel nonfasting: LDL 98, cholesterol 186, triglycerides 272, HDL 34 (06/13/2022), HbgA1c: 5.8, QTc: 450 (06/14/2022) and QTC 464ms (06/24/2022)) Continue Depakote 1000 mg qhs for mood lability (med started 9/29; depakote trough level, CBC, hepatic function panel to be performed on 06/29/22) Agitation protocol ordered PRN [facility] referral made by SW "Given his lack of insight into his mental health issues, the chronic nature of his delusions and psychosis, the recent aggression with family prior to admission, and concerns for lack of medication compliance after discharge, the patient would likely benefit from a higher level of care after discharge than boarding house or shelter but is presently refusing a group home. He would likely benefit from a guardian to assist with decision making after discharge given his ongoing refractory psychosis".  SW working to find appropriate setting for discharge.   Medical Problems: -Elevated creatinine - improved Creatinine 1.33 > 1.20 (appears chronic compared to labs 11-12 months ago when creatinine was 1.35-1.46) Repeat BMP 9/27 shows creatinine 1.20 Encourage p.o. fluids   -Prediabetes A1c 5.8 06/24/22 - will need f/u with PCP after discharge for monitoring while on antipsychotic medication   -Hypercholesterolemia Continue home atorvastatin 20 mg qHS   -Incomplete right bundle branch block -OSA  Does have h/o OSA, would benefit from CPAP. Saw sleep clinic in June 2023.  Follow-up with PCP Would benefit from resuming sleep clinic f/u for CPAP Repeat EKG 9/27 NSR with  Incomplete RBBB - f/u with PCP after discharge   -Tobacco Use Disorder   -- Nicotine patch  21mg /24 hours ordered  -- Smoking cessation encouraged and illicit drug use  discouraged -Cannabis use disorder  -- Smoking cessation encouraged and illicit drug use  discouraged --r/o ETOH use disorder             -- CIWA discontinued based on scores  -Seasonal allergies -Continued home singulair -Atopic dermatitis  -Hypertension Will continue Clonidine PRN for SBP >160 or DBP >110 and monitor Continue Amlodipine 10 mg as blood pressure continues to be elevated  PRN medications:  acetaminophen, alum & mag hydroxide-simeth, haloperidol lactate **AND** LORazepam **AND** diphenhydrAMINE, docusate sodium, haloperidol **AND** LORazepam, magnesium hydroxide, traZODone  Pertinent Labs: no new labs ordered today  Consults: SW to follow up with patient to plan contact with group home for discharge planning.    Discharge Planning: -- Social work and case management to assist with discharge planning and identification of hospital follow-up needs prior to discharge  -- Discharge Concerns: Need to establish a safety plan; Medication compliance and effectiveness -- Discharge Goals: Return home with outpatient referrals for mental health follow-up including medication management/psychotherapy Patient unable to return home to parents - recommend guardianship at this time Patient has been served 50b since [place] admission   Inda Merlin, NP 07/02/2022, 2:10 PMPatient ID: Denna Haggard, male   DOB: 1995/11/13, 26 y.o.   MRN: [id number]

## 2022-07-02 NOTE — Progress Notes (Signed)
   07/02/22 0800  Psych Admission Type (Psych Patients Only)  Admission Status Involuntary  Psychosocial Assessment  Patient Complaints Anxiety;Depression;Suspiciousness  Eye Contact Brief  Facial Expression Animated  Affect Anxious  Speech Logical/coherent  Interaction Childlike  Motor Activity Slow  Appearance/Hygiene Body odor  Behavior Characteristics Cooperative;Anxious  Mood Anxious  Aggressive Behavior  Targets Family  Type of Behavior Verbal  Effect No apparent injury  Thought Process  Coherency WDL  Content WDL  Delusions None reported or observed  Perception WDL  Hallucination None reported or observed  Judgment Impaired  Confusion None  Danger to Self  Current suicidal ideation? Denies  Danger to Others  Danger to Others None reported or observed

## 2022-07-02 NOTE — Group Note (Signed)
Recreation Therapy Group Note   Group Topic:Problem Solving  Group Date: 07/02/2022 Start Time: 1000 End Time: 1035 Facilitators: Zaleah Ternes-McCall, LRT,CTRS Location: [address] Dayroom   Goal Area(s) Addresses:  Patient will effectively work with peer towards shared goal.  Patient will identify skills used to make activity successful.  Patient will share challenges and verbalize solution-driven approaches used. Patient will identify how skills used during activity can be used to reach post d/c goals.   Group Description: [employer]. Patients were provided the following materials: 5 drinking straws, 5 rubber bands, 5 paper clips, 2 index cards and 2 drinking cups. Using the provided materials patients were asked to build a launching mechanism to launch a ping pong ball across the room, approximately 10 feet. Patients were divided into teams of 3-5. Instructions required all materials be incorporated into the device, functionality of items left to the peer group's discretion.   Affect/Mood: N/A   Participation Level: Did not attend    Clinical Observations/Individualized Feedback:     Plan: Continue to engage patient in RT group sessions 2-3x/week.   Sheva Mcdougle-McCall, LRT,CTRS 07/02/2022 11:58 AM

## 2022-07-02 NOTE — Plan of Care (Signed)
  Problem: Coping: Goal: Ability to verbalize frustrations and anger appropriately will improve Outcome: Progressing Goal: Ability to demonstrate self-control will improve Outcome: Progressing   Problem: Activity: Goal: Interest or engagement in activities will improve Outcome: Progressing Goal: Sleeping patterns will improve Outcome: Progressing   

## 2022-07-03 ENCOUNTER — Encounter (HOSPITAL_COMMUNITY): Payer: Self-pay

## 2022-07-03 NOTE — [facility] Group Notes (Signed)
  Spirituality group facilitated by Chaplain Katy Jahlen Bollman, BCC.   Group Description: Group focused on topic of strength. Patients participated in facilitated discussion around topic, connecting with one another around experiences and definitions for hope. Group members engaged with visual explorer photos, reflecting on what strength looks like for them today. Group engaged in discussion around how their definitions of hope are present today in hospital.   Modalities: Psycho-social ed, Adlerian, Narrative, MI   Patient Progress: Did not attend.  

## 2022-07-03 NOTE — Group Note (Signed)
Recreation Therapy Group Note   Group Topic:Problem Solving  Group Date: 07/03/2022 Start Time: 1005 End Time: 1035 Facilitators: Anton Cheramie-McCall, LRT,CTRS Location: [address] Dayroom   Goal Area(s) Addresses:  Patient will effectively work with peer towards shared goal.  Patient will identify skills used to make activity successful.  Patient will identify how skills used during activity can be used to reach post d/c goals.    Group Description:  [place]. Working together, patients were asked to use colored discs to get the entire team from one end of the hall way to the other. Patients were only allowed to move down and back the hallway by stepping on the discs, the team was provided 1 additional disc to assist with them completing task.  In addition, LRT would place cones in the path of the group forcing them to adjust their path.     Affect/Mood: N/A   Participation Level: Did not attend    Clinical Observations/Individualized Feedback:     Plan: Continue to engage patient in RT group sessions 2-3x/week.   Emanie Behan-McCall, LRT,CTRS 07/03/2022 12:34 PM

## 2022-07-03 NOTE — [facility] Group Notes (Signed)
Patient did not attend goals group, despite encouragement.  
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
## 2022-07-03 NOTE — Group Note (Signed)
LCSW Group Therapy Note   Group Date: 07/03/2022 Start Time: 1100 End Time: 1200   Type of Therapy and Topic:  Group Therapy: Boundaries  Participation Level:  Did Not Attend  Description of Group: This group will address the use of boundaries in their personal lives. Patients will explore why boundaries are important, the difference between healthy and unhealthy boundaries, and negative and postive outcomes of different boundaries and will look at how boundaries can be crossed.  Patients will be encouraged to identify current boundaries in their own lives and identify what kind of boundary is being set. Facilitators will guide patients in utilizing problem-solving interventions to address and correct types boundaries being used and to address when no boundary is being used. Understanding and applying boundaries will be explored and addressed for obtaining and maintaining a balanced life. Patients will be encouraged to explore ways to assertively make their boundaries and needs known to significant others in their lives, using other group members and facilitator for role play, support, and feedback.  Therapeutic Goals:  1.  Patient will identify areas in their life where setting clear boundaries could be  used to improve their life.  2.  Patient will identify signs/triggers that a boundary is not being respected. 3.  Patient will identify two ways to set boundaries in order to achieve balance in  their lives: 4.  Patient will demonstrate ability to communicate their needs and set boundaries  through discussion and/or role plays  Summary of Patient Progress: Did not attend  Therapeutic Modalities:   [place], LCSW 07/03/2022  1:06 PM

## 2022-07-03 NOTE — Progress Notes (Signed)
   07/03/22 0800  Psych Admission Type (Psych Patients Only)  Admission Status Involuntary  Psychosocial Assessment  Patient Complaints Anxiety  Eye Contact Fair  Facial Expression Animated  Affect Anxious  Speech Logical/coherent  Interaction Childlike  Motor Activity Slow  Appearance/Hygiene Unremarkable  Behavior Characteristics Cooperative;Guarded  Mood Anxious  Aggressive Behavior  Targets Family  Type of Behavior Verbal  Effect No apparent injury  Thought Process  Coherency WDL  Content WDL  Delusions None reported or observed  Perception WDL  Hallucination None reported or observed  Judgment Impaired  Confusion None  Danger to Self  Current suicidal ideation? Passive  Danger to Others  Danger to Others None reported or observed

## 2022-07-03 NOTE — Plan of Care (Signed)

## 2022-07-03 NOTE — BH IP Treatment Plan (Signed)
Interdisciplinary Treatment and Diagnostic Plan Update  07/03/2022 Time of Session: [place] MRN: [id number]  Principal Diagnosis: Schizoaffective disorder, bipolar type ([place])  Secondary Diagnoses: Principal Problem:   Schizoaffective disorder, bipolar type ([place]) Active Problems:   Cannabis use disorder   OSA (obstructive sleep apnea)   Prediabetes   Tobacco use disorder   Essential hypertension   Hyperlipidemia   Current Medications:  Current Facility-Administered Medications  Medication Dose Route Frequency Provider Last Rate Last Admin   acetaminophen (TYLENOL) tablet 650 mg  650 mg Oral Q6H PRN Ajibola, Ene A, NP       alum & mag hydroxide-simeth (MAALOX/MYLANTA) 200-200-20 MG/5ML suspension 30 mL  30 mL Oral Q4H PRN Ajibola, Ene A, NP       amLODipine (NORVASC) tablet 10 mg  10 mg Oral Daily France Ravens, MD   10 mg at 07/03/22 0814   atorvastatin (LIPITOR) tablet 20 mg  20 mg Oral QHS Merrily Brittle, DO   20 mg at 07/02/22 2019   benztropine (COGENTIN) tablet 0.5 mg  0.5 mg Oral BID France Ravens, MD   0.5 mg at 07/03/22 5176   haloperidol lactate (HALDOL) injection 5 mg  5 mg Intramuscular Q6H PRN France Ravens, MD       And   LORazepam (ATIVAN) injection 2 mg  2 mg Intramuscular Q6H PRN France Ravens, MD       And   diphenhydrAMINE (BENADRYL) injection 50 mg  50 mg Intravenous Q6H PRN France Ravens, MD       divalproex (DEPAKOTE ER) 24 hr tablet 1,000 mg  1,000 mg Oral QHS Nelda Marseille, Amy E, MD   1,000 mg at 07/02/22 2019   docusate sodium (COLACE) capsule 100 mg  100 mg Oral Daily PRN Harlow Asa, MD       haloperidol (HALDOL) tablet 10 mg  10 mg Oral Q1200 France Ravens, MD   10 mg at 07/03/22 0815   haloperidol (HALDOL) tablet 15 mg  15 mg Oral QHS France Ravens, MD   15 mg at 07/02/22 2020   haloperidol (HALDOL) tablet 5 mg  5 mg Oral Q8H PRN France Ravens, MD       And   LORazepam (ATIVAN) tablet 1 mg  1 mg Oral Q8H PRN France Ravens, MD       magnesium hydroxide (MILK OF  MAGNESIA) suspension 30 mL  30 mL Oral Daily PRN Ajibola, Ene A, NP       montelukast (SINGULAIR) tablet 10 mg  10 mg Oral QHS Merrily Brittle, DO   10 mg at 07/02/22 2019   multivitamin with minerals tablet 1 tablet  1 tablet Oral Daily Harlow Asa, MD   1 tablet at 07/03/22 0814   thiamine (Vitamin B-1) tablet 100 mg  100 mg Oral Daily Nelda Marseille, Amy E, MD   100 mg at 07/03/22 0814   traZODone (DESYREL) tablet 50 mg  50 mg Oral QHS PRN Harlow Asa, MD   50 mg at 07/01/22 2018   PTA Medications: Medications Prior to Admission  Medication Sig Dispense Refill Last Dose   OLANZapine (ZYPREXA) 10 MG tablet Take 1 tablet (10 mg total) by mouth at bedtime. 30 tablet 2 Past Month   risperiDONE (RISPERDAL) 3 MG tablet Take 3 mg by mouth 2 (two) times daily.   Past Month    Patient Stressors: Marital or family conflict   Medication change or noncompliance   Occupational concerns   Substance abuse  Patient Strengths: Active sense of humor  Physical Health  Work skills   Treatment Modalities: Medication Management, Group therapy, Case management,  1 to 1 session with clinician, Psychoeducation, Recreational therapy.   Physician Treatment Plan for Primary Diagnosis: Schizoaffective disorder, bipolar type (HCC) Long Term Goal(s): Improvement in symptoms so as ready for discharge   Short Term Goals: Ability to identify changes in lifestyle to reduce recurrence of condition will improve Ability to verbalize feelings will improve Ability to disclose and discuss suicidal ideas Ability to demonstrate self-control will improve Ability to identify and develop effective coping behaviors will improve  Medication Management: Evaluate patient's response, side effects, and tolerance of medication regimen.  Therapeutic Interventions: 1 to 1 sessions, Unit Group sessions and Medication administration.  Evaluation of Outcomes: Progressing  Physician Treatment Plan for Secondary Diagnosis:  Principal Problem:   Schizoaffective disorder, bipolar type (HCC) Active Problems:   Cannabis use disorder   OSA (obstructive sleep apnea)   Prediabetes   Tobacco use disorder   Essential hypertension   Hyperlipidemia  Long Term Goal(s): Improvement in symptoms so as ready for discharge   Short Term Goals: Ability to identify changes in lifestyle to reduce recurrence of condition will improve Ability to verbalize feelings will improve Ability to disclose and discuss suicidal ideas Ability to demonstrate self-control will improve Ability to identify and develop effective coping behaviors will improve     Medication Management: Evaluate patient's response, side effects, and tolerance of medication regimen.  Therapeutic Interventions: 1 to 1 sessions, Unit Group sessions and Medication administration.  Evaluation of Outcomes: Progressing   RN Treatment Plan for Primary Diagnosis: Schizoaffective disorder, bipolar type (HCC) Long Term Goal(s): Knowledge of disease and therapeutic regimen to maintain health will improve  Short Term Goals: Ability to remain free from injury will improve, Ability to verbalize frustration and anger appropriately will improve, Ability to demonstrate self-control, Ability to participate in decision making will improve, Ability to verbalize feelings will improve, Ability to disclose and discuss suicidal ideas, Ability to identify and develop effective coping behaviors will improve, and Compliance with prescribed medications will improve  Medication Management: RN will administer medications as ordered by provider, will assess and evaluate patient's response and provide education to patient for prescribed medication. RN will report any adverse and/or side effects to prescribing provider.  Therapeutic Interventions: 1 on 1 counseling sessions, Psychoeducation, Medication administration, Evaluate responses to treatment, Monitor vital signs and CBGs as ordered,  Perform/monitor CIWA, COWS, AIMS and Fall Risk screenings as ordered, Perform wound care treatments as ordered.  Evaluation of Outcomes: Progressing   LCSW Treatment Plan for Primary Diagnosis: Schizoaffective disorder, bipolar type (HCC) Long Term Goal(s): Safe transition to appropriate next level of care at discharge, Engage patient in therapeutic group addressing interpersonal concerns.  Short Term Goals: Engage patient in aftercare planning with referrals and resources, Increase social support, Increase ability to appropriately verbalize feelings, Increase emotional regulation, Facilitate acceptance of mental health diagnosis and concerns, Facilitate patient progression through stages of change regarding substance use diagnoses and concerns, Identify triggers associated with mental health/substance abuse issues, and Increase skills for wellness and recovery  Therapeutic Interventions: Assess for all discharge needs, 1 to 1 time with Social worker, Explore available resources and support systems, Assess for adequacy in community support network, Educate family and significant other(s) on suicide prevention, Complete Psychosocial Assessment, Interpersonal group therapy.  Evaluation of Outcomes: Progressing   Progress in Treatment: Attending groups: Yes. Participating in groups: Yes. Taking medication as prescribed:  Yes. Toleration medication: Yes. Family/Significant other contact made: Yes, individual(s) contacted:  Seena Face, [phone]   Patient understands diagnosis: No. Discussing patient identified problems/goals with staff: Yes. Medical problems stabilized or resolved: Yes. Denies suicidal/homicidal ideation: No. Issues/concerns per patient self-inventory: Yes. Other: none  New problem(s) identified: No, Describe:  none  New Short Term/Long Term Goal(s): Patient to work towards detox, elimination of symptoms of psychosis, medication management for mood stabilization;  elimination of SI thoughts; development of comprehensive mental wellness/sobriety plan.  Patient Goals:  No additional goals identified at this time. Patient to continue to work towards original goals identified in initial treatment team meeting. CSW will remain available to patient should they voice additional treatment goals.    Discharge Plan or Barriers: Patient continues to be placement concern, lacks adequate hosing/supervision after discharge. CSW to work with DSS guardianship and aftercare facilities to place patient.   Reason for Continuation of Hospitalization: Other; describe psychosis   Estimated Length of Stay: TBD   Scribe for Treatment Team: Almedia Balls 07/03/2022 3:23 PM

## 2022-07-03 NOTE — BHH Counselor (Addendum)
BHH/BMU LCSW Progress Note   07/03/2022    1:12 PM    KONYE HUNDLEY    [id number]    Type of Contact and Topic: Group Home Placement   CSW called group homes in [employer] [place], [place], [place], [place] and [employer] with below results:    No Availability   [facility] [place], [place] -- only taking Economist at this time [place], [place], [place], [place] - only females [place], [place]  10/6- called and staff member asked to call her boss.  Called boss Edon who reports no current bed availability but that they are awaiting discharge of one patient.  She is unsure when he will be discharged but asked for [place] to send information of patient and she would reach out if bed became available.  CSW sent information to [email or web address]     [place], [place], [place], [place] [id number]   [place]- 10/5 no bed availability [place], [place] - called back on 10/6 stating no bed availability   [facility] [place], [place], [place] [facility]- [place], [place] [facility] [place], [place], [place], [place]     Did not answer   [place] [facility] [place], [place]  10/6- called and left a message for a call back regarding male bed availability [place] [facility] [place], [place]  10/6- called and unable to leave a message regarding male bed availability [place], [place]  10/6- called and unable to leave a message- mailbox is full [facility] [place], [place]  10/6- called and unable to leave a message- memory  full  [place], [place]  10/6- called and staff member asked to call her boss.  Called boss [place] who reports no current bed availability but that they are awaiting discharge of one patient.  She is unsure when he will be discharged but asked for [place] to send information of patient and she would reach out if bed became available   [facility], [place]  10/6- called to check in about bed availability and unable to leave a voicemail   [place]  [place] and [place], [place]   10/6 - called to check in about bed availability and left a voicemail for a call back  [place], [place], [place], [place] [facility] [place], [place], [place]-- normally they take older individuals but are willing to look at his information  10/6- called to follow up on referral. No answer, CSW left message for return call  [place], [place] - normally take older individuals but willing to look at referral- emailed to [email or web address], phone number is [phone]  10/6- Called to follow up on referral.  Cherry answered and said that she was on her way out of town but once she gets to where she is going she will give a call back to discuss the referral.  [facility]- [place],  [place]  10/6- spoke with Luberta Robertson who requested we send patient information to his email.  CSW sent information to [email or web address]  [facility], [place]  10/6- called and left a message re: male bed availability  10/6- called back and requested we send information to [email or web address]. CSW sent relevent information   [facility]: [place] and [place], [place] - sent to  [email or web address]   [place] No sent referrals   [place] No sent referrals  [facility] and [place], [place]  10/6- requested to send to [email or web address] and they would look over it on Monday.  No group home availability but they have independent living availability with support.    Declined Referral    [place], [place] - sent referral to [email or web address]- stated the home could not meet the needs of the patient  Possibly Accepting  [facility], [place], [phone], email: [email or web address]  10/5- manager wanted more information about patient and requested for mother to call to provide background information, mother agreed to call group home manager Eusebio Friendly.  10/6- Mother called and provided needed information, CSW called Eusebio Friendly back and he stated that he would send intake paperwork for [place] to complete and patient to sign.  CSW provided email address so that Mr. Stann Mainland can send intake paperwork to [place].  CSW awaiting intake paperwork.   CSW also got message from patient mother who understands that we need to send patient with what is available but is worried about the area that the group home is located.  Mother requested for a list of other group homes that may be possible places that they can follow up on once he is placed.     Signed:  Adora Yeh, LCSW,  LCAS 07/03/2022 1:12 PM

## 2022-07-03 NOTE — Progress Notes (Signed)
   07/03/22 2200  Psych Admission Type (Psych Patients Only)  Admission Status Voluntary  Psychosocial Assessment  Patient Complaints Isolation;Anxiety  Eye Contact Fair  Facial Expression Animated  Affect Appropriate to circumstance  Speech Logical/coherent  Interaction Assertive  Motor Activity Slow  Appearance/Hygiene Body odor  Behavior Characteristics Guarded  Mood Anxious  Thought Process  Coherency WDL  Content WDL  Delusions None reported or observed  Perception WDL  Hallucination None reported or observed  Judgment Impaired  Confusion None  Danger to Self  Current suicidal ideation? Denies  Agreement Not to Harm Self Yes  Description of Agreement verbal  Danger to Others  Danger to Others None reported or observed

## 2022-07-04 MED ORDER — PROPRANOLOL HCL ER 60 MG PO CP24
60.0000 mg | ORAL_CAPSULE | Freq: Every day | ORAL | Status: DC
  Administered 2022-07-04: 60 mg via ORAL
  Filled 2022-07-04 (×3): qty 1

## 2022-07-04 NOTE — Plan of Care (Signed)

## 2022-07-04 NOTE — Progress Notes (Signed)
   07/04/22 0800  Psych Admission Type (Psych Patients Only)  Admission Status Voluntary  Psychosocial Assessment  Patient Complaints Isolation;Anxiety  Eye Contact Fair  Facial Expression Animated  Affect Appropriate to circumstance  Speech Logical/coherent  Interaction Assertive  Motor Activity Slow  Appearance/Hygiene Body odor  Behavior Characteristics Guarded  Mood Anxious  Aggressive Behavior  Effect No apparent injury  Thought Process  Coherency WDL  Content WDL  Delusions None reported or observed  Perception WDL  Hallucination None reported or observed  Judgment Impaired  Confusion None  Danger to Self  Current suicidal ideation? Denies  Agreement Not to Harm Self Yes  Description of Agreement verbal  Danger to Others  Danger to Others None reported or observed

## 2022-07-04 NOTE — Progress Notes (Signed)
Adult Psychoeducational Group Note  Date:  07/04/2022 Time:  9:02 PM  Group Topic/Focus:  Wrap-Up Group:   The focus of this group is to help patients review their daily goal of treatment and discuss progress on daily workbooks.  Participation Level:  Did Not Attend  Participation Quality:  Did Not Attend  Affect:  Did Not Attend  Cognitive:  Did Not Attend  Insight: Did Not Attend  Engagement in Group:  Did Not Attend  Modes of Intervention:  Did Not Attend  Additional Comments:   Pt was encouraged to attend group discussion but refused   Gerhard Perches 07/04/2022, 9:02 PM

## 2022-07-04 NOTE — BHH Group Notes (Signed)
Psychoeducational Group Note  Date: 07/04/22 Time: 0900-1000    Goal Setting   Purpose of Group: This group helps to provide patients with the steps of setting a goal that is specific, measurable, attainable, realistic and time specific. A discussion on how we keep ourselves stuck with negative self talk. Homework given for Patients to write 20 positive attributes about themselves, and to share it with the group.    Participation Level:  did not attend  Paulino Rily
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
## 2022-07-04 NOTE — Group Note (Signed)
LCSW Group Therapy Note  07/04/2022      Type of Therapy and Topic:  Group Therapy: Gratitude  Participation Level:  Did Not Attend   Description of Group:   In this group, patients shared and discussed the importance of acknowledging the elements in their lives for which they are grateful and how this can positively impact their mood.  The group discussed how bringing the positive elements of their lives to the forefront of their minds can help with recovery from any illness, physical or mental.  An exercise was done as a group in which a list was made of gratitude items in order to encourage participants to consider other potential positives in their lives.  Therapeutic Goals: Patients will identify one or more item for which they are grateful in each of 6 categories:  people, experience, thing, place, skill, and other. Patients will discuss how it is possible to seek out gratitude in even bad situations. Patients will explore other possible items of gratitude that they could remember.   Summary of Patient Progress:  The patient did not attend this group.  Therapeutic Modalities:   Solution-Focused Therapy Activity  Kimala Horne, LCSWA 1:18 PM

## 2022-07-05 MED ORDER — PROPRANOLOL HCL ER 80 MG PO CP24
80.0000 mg | ORAL_CAPSULE | Freq: Every day | ORAL | Status: DC
  Administered 2022-07-05: 80 mg via ORAL
  Filled 2022-07-05 (×3): qty 1

## 2022-07-05 NOTE — Progress Notes (Signed)
Adult Psychoeducational Group Note  Date:  07/05/2022 Time:  12:07 PM  Pt did not attend goals group.

## 2022-07-05 NOTE — Progress Notes (Signed)
   07/05/22 0100  Psych Admission Type (Psych Patients Only)  Admission Status Voluntary  Psychosocial Assessment  Patient Complaints Anxiety;Isolation  Eye Contact Fair  Facial Expression Animated  Affect Appropriate to circumstance  Speech Logical/coherent  Interaction Assertive  Motor Activity Slow  Appearance/Hygiene Body odor  Behavior Characteristics Guarded  Mood Pleasant  Thought Process  Coherency WDL  Content WDL  Delusions None reported or observed  Perception WDL  Hallucination None reported or observed  Judgment Poor  Confusion None  Danger to Self  Current suicidal ideation? Denies  Agreement Not to Harm Self Yes  Description of Agreement verbal  Danger to Others  Danger to Others None reported or observed

## 2022-07-05 NOTE — Plan of Care (Signed)
Nurses discussed anxiety, depression and coping skills with patient.

## 2022-07-05 NOTE — Group Note (Signed)
LCSW Group Therapy Note   Group Date: 02/11/2022 Start Time: 1300 End Time: 1400   Type of Therapy and Topic:  Group Therapy: Wise Mind  Participation Level:  Did not attend  Description of Group: Group members were presented with topic about wise mind, reasonable mind, and emotional mind.  Group members asked to identify situations were they used their wise mind, reasonable mind and emotional mind.  Members asked to identify how behaviors affected them after using parts of their mind and identified alternative behaviors they can use when they are in crisis.    Therapeutic Goals:  1. Patients will identify consequences of using emotional mind and rational mind.  2. Patients will engage in discussion on how they cope when they are in crisis 3.  Patients will discuss coping mechanisms to engage their wise mind     Summary of Patient Progress:    Patient did not attend  Therapeutic Modalities:  DBT Solution focused therapy    Gray Maugeri E Dub Maclellan, LCSW

## 2022-07-05 NOTE — Progress Notes (Signed)
Adult Psychoeducational Group Note  Date:  07/05/2022 Time:  8:50 PM  Group Topic/Focus:  Wrap-Up Group:   The focus of this group is to help patients review their daily goal of treatment and discuss progress on daily workbooks.  Participation Level:  Did Not Attend  Participation Quality:  Did Not Attend  Affect:  Did Not Attend  Cognitive:  Did Not Attend  Insight: Did Not Attend  Engagement in Group:  Did Not Attend  Modes of Intervention:  Did Not Attend  Additional Comments:   Pt was encouraged to attend group discussion but refused  Gerhard Perches 07/05/2022, 8:50 PM

## 2022-07-06 MED ORDER — PROPRANOLOL HCL ER 120 MG PO CP24
120.0000 mg | ORAL_CAPSULE | Freq: Every day | ORAL | Status: DC
  Administered 2022-07-06 – 2022-07-08 (×3): 120 mg via ORAL
  Filled 2022-07-06: qty 14
  Filled 2022-07-06: qty 1
  Filled 2022-07-06: qty 14
  Filled 2022-07-06 (×2): qty 1

## 2022-07-06 NOTE — Group Note (Signed)
Recreation Therapy Group Note   Group Topic:Goal Setting  Group Date: 07/06/2022 Start Time: 1000 End Time: 1050 Facilitators: Keyairra Kolinski-McCall, LRT,CTRS Location: [address] Day[address]   Goal Area(s) Addresses:  Patient will participate in discussion of what a goal is. Patient will successfully identify goals they want to set for various points in time.  Group Description: Group started with a discussion about what goals were. Patients were asked what their definition of a goal was.  Patients were given a sheet that broken down goal setting into different times (week, month, year, 5 years).  Patients were to then identify any obstacles that would impede their process to completing their goals, what they needed in order to be successful in completing those goals and what they could start doing now to work towards those goals.   Affect/Mood: N/A   Participation Level: Did not attend    Clinical Observations/Individualized Feedback:     Plan: Continue to engage patient in RT group sessions 2-3x/week.   Larkin Morelos-McCall, LRT,CTRS 07/06/2022 12:43 PM

## 2022-07-06 NOTE — Progress Notes (Signed)
   07/05/22 2200  Psych Admission Type (Psych Patients Only)  Admission Status Voluntary  Psychosocial Assessment  Patient Complaints Isolation;Depression  Eye Contact Fair  Facial Expression Animated  Affect Appropriate to circumstance  Speech Logical/coherent  Interaction Assertive  Motor Activity Slow  Appearance/Hygiene Unremarkable  Behavior Characteristics Guarded  Mood Pleasant  Thought Process  Coherency WDL  Content WDL  Delusions None reported or observed  Perception WDL  Hallucination None reported or observed  Judgment Poor  Confusion None  Danger to Self  Current suicidal ideation? Denies  Agreement Not to Harm Self Yes  Description of Agreement verbal  Danger to Others  Danger to Others None reported or observed

## 2022-07-06 NOTE — Group Note (Signed)
LCSW Group Therapy  Type of Therapy and Topic:  Group Therapy: Thoughts, Feelings, and Actions  Participation Level:  Did Not Attend   Description of Group:   In this group, each patient discussed their previous experiencing and understanding of overthinking, identifying the harmful impact on their lives. As a group, each patient was introduced to the basic concepts of Cognitive Behavioral Therapy: that thoughts, feelings, and actions are all connected and influence one another. They were given examples of how overthinking can affect our feelings, actions, and vise versa. The group was then asked to analyze how overthinking was harmful and brainstorm alternative thinking patterns/reactions to the example situation. Then, each group member filled out and identified their own example situation in which a problem situation caused their thoughts, feelings, and actions to be negatively impacted; they were asked to come up with 3 new (more adaptive/positive) thoughts that led to 3 new feelings and actions.  Therapeutic Goals: Patients will review and discuss their past experience with overthinking. Patients will learn the basics of the CBT model through group-led examples.. Patients will identify situations where they may have negative thoughts, feelings, or actions and will then reframe the situation using more positive thoughts to react differently.  Summary of Patient Progress:  Did not attend  Therapeutic Modalities:   Cognitive [place], LCSW 07/06/2022  12:07 PM

## 2022-07-06 NOTE — Progress Notes (Signed)
[facility] MD Progress Note  07/06/2022 10:38 AM Gregory Saunders  MRN:  [id number]   Reason for Admission:  Gregory Saunders is a 26 y.o., male with PMH significant for schizoaffective disorder-bipolar type, cannabis use disorder, tobacco use disorder, who presented to [facility] (06/08/2022) via parents for assaulting parents and bizarre behaviors, then admitted to [facility] (06/13/2022). Involuntary treatment of acute psychosis in the setting of medication non-compliance. The patient is currently on Hospital Day 23.   Chart Review from last 24 hours:  The patient's chart was reviewed and nursing notes were reviewed. The patient's case was discussed in multidisciplinary team meeting. Per [facility] patient is compliant with his scheduled medication including psychotropic medications Cogentin 0.5 mg twice daily, Depakote 1000 mg at bedtime, Haldol 10 mg in the morning and 50 mg at bedtime, no need for as needed medication for agitation or aggression noted or reported, using trazodone as needed for sleep intermittently last use 10/4.  Patient reported by staff to be denying any SI HI or AVH almost all the time, cooperative in general with no agitation reported but secluding himself to the room not attending groups despite encouragement.  Chart review continues to indicate elevated blood pressure readings as well as occasional tachycardia.  Patient is on Norvasc for hypertension 10 mg daily, patient was a started on metoprolol LA and the dose was titrated last night to 80 mg, blood pressure readings seem to be improving at times but this morning still elevated.  Pulse rate improved some since he started metoprolol.  Information Obtained Today During Patient Interview: The patient was seen and evaluated on the unit.  Patient was evaluated in his room he is lying down in bed withdrawn but answering questions appropriately in a linear manner, continues to report fair mood denies depressed mood or  anxiety, denies SI HI or AVH denies paranoia or other delusions.  I did counsel patient regarding need to attend groups at least twice daily, will follow.  Patient denies side effect to medications.  Discussed with patient current referral process to group homes in the area and awaiting response. Patient continues to present denying any AVH or paranoia or delusions and does not present responding to stimuli he is compliant with medications on the unit without any incident of agitation or aggression reported.  He refuses to be on long-acting injection for Haldol but compliant with oral medications with no side effects reported.  Sleep  Sleep: Good sleep reported by patient   Principal Problem: Schizoaffective disorder, bipolar type (HCC) Diagnosis: Principal Problem:   Schizoaffective disorder, bipolar type (HCC) Active Problems:   Cannabis use disorder   OSA (obstructive sleep apnea)   Prediabetes   Tobacco use disorder   Essential hypertension   Hyperlipidemia    Past Psychiatric History: See H&P  Past Medical History:  Past Medical History:  Diagnosis Date   Cannabis use disorder 10/16/2020   Schizophrenia (HCC)    Strain of right Achilles tendon 04/07/2019   Tobacco use disorder 06/14/2022    Past Surgical History:  Procedure Laterality Date   stye removal     WISDOM TOOTH EXTRACTION     Family History: History reviewed. No pertinent family history. Family Psychiatric  History: See H&P Social History: See H&P  Sleep: Good  Appetite: Good  Current Medications: Current Facility-Administered Medications  Medication Dose Route Frequency Provider Last Rate Last Admin   acetaminophen (TYLENOL) tablet 650 mg  650 mg Oral Q6H PRN Ajibola, Ene A,  NP       alum & mag hydroxide-simeth (MAALOX/MYLANTA) 200-200-20 MG/5ML suspension 30 mL  30 mL Oral Q4H PRN Ajibola, Ene A, NP       amLODipine (NORVASC) tablet 10 mg  10 mg Oral Daily Park Pope, MD   10 mg at 07/06/22 0815    atorvastatin (LIPITOR) tablet 20 mg  20 mg Oral QHS Princess Bruins, DO   20 mg at 07/05/22 2103   benztropine (COGENTIN) tablet 0.5 mg  0.5 mg Oral BID Park Pope, MD   0.5 mg at 07/06/22 0815   haloperidol lactate (HALDOL) injection 5 mg  5 mg Intramuscular Q6H PRN Park Pope, MD       And   LORazepam (ATIVAN) injection 2 mg  2 mg Intramuscular Q6H PRN Park Pope, MD       And   diphenhydrAMINE (BENADRYL) injection 50 mg  50 mg Intravenous Q6H PRN Park Pope, MD       divalproex (DEPAKOTE ER) 24 hr tablet 1,000 mg  1,000 mg Oral QHS Mason Jim, Amy E, MD   1,000 mg at 07/05/22 2102   docusate sodium (COLACE) capsule 100 mg  100 mg Oral Daily PRN Comer Locket, MD       haloperidol (HALDOL) tablet 10 mg  10 mg Oral Q1200 Park Pope, MD   10 mg at 07/06/22 0815   haloperidol (HALDOL) tablet 15 mg  15 mg Oral Seabron Spates, MD   15 mg at 07/05/22 2103   haloperidol (HALDOL) tablet 5 mg  5 mg Oral Q8H PRN Park Pope, MD       And   LORazepam (ATIVAN) tablet 1 mg  1 mg Oral Q8H PRN Park Pope, MD       magnesium hydroxide (MILK OF MAGNESIA) suspension 30 mL  30 mL Oral Daily PRN Ajibola, Ene A, NP       montelukast (SINGULAIR) tablet 10 mg  10 mg Oral QHS Princess Bruins, DO   10 mg at 07/05/22 2102   multivitamin with minerals tablet 1 tablet  1 tablet Oral Daily Comer Locket, MD   1 tablet at 07/06/22 0815   propranolol ER (INDERAL LA) 24 hr capsule 80 mg  80 mg Oral QHS Dakotah Orrego, MD   80 mg at 07/05/22 2102   thiamine (Vitamin B-1) tablet 100 mg  100 mg Oral Daily Mason Jim, Amy E, MD   100 mg at 07/06/22 0815   traZODone (DESYREL) tablet 50 mg  50 mg Oral QHS PRN Comer Locket, MD   50 mg at 07/04/22 2059    Lab Results: No results found for this or any previous visit (from the past 48 hour(s)).  Blood Alcohol level:  Lab Results  Component Value Date   ETH <10 06/08/2022   ETH <10 06/20/2021    Metabolic Disorder Labs: Lab Results  Component Value Date   HGBA1C 5.8 (H)  06/24/2022   MPG 119.76 06/24/2022   No results found for: "PROLACTIN" Lab Results  Component Value Date   CHOL 186 06/13/2022   TRIG 272 (H) 06/13/2022   HDL 34 (L) 06/13/2022   CHOLHDL 5.5 06/13/2022   VLDL 54 (H) 06/13/2022   LDLCALC 98 06/13/2022    Physical Findings: AIMS: Facial and Oral Movements Muscles of Facial Expression: None, normal Lips and Perioral Area: None, normal Jaw: None, normal Tongue: None, normal,Extremity Movements Upper (arms, wrists, hands, fingers): None, normal Lower (legs, knees, ankles, toes): None, normal, Trunk Movements Neck,  shoulders, hips: None, normal, Overall Severity Severity of abnormal movements (highest score from questions above): None, normal Incapacitation due to abnormal movements: None, normal Patient's awareness of abnormal movements (rate only patient's report): No Awareness, Dental Status Current problems with teeth and/or dentures?: No Does patient usually wear dentures?: No  CIWA:  CIWA-Ar Total: 0 COWS:     Musculoskeletal: Strength & Muscle Tone: within normal limits Gait & Station: normal Patient leans: N/A  Psychiatric Specialty Exam:  General Appearance: Appears at stated age below average hygiene, unkempt casually dressed  Behavior: Guarded but cooperative in general, calm with no agitation or anxiety noted  Psychomotor Activity: No psychomotor agitation or retardation noted  Eye Contact: Limited Speech: Decreased amount   Mood: Euthymic Affect: Restricted affect at baseline  Thought Process: Linear and goal-directed yet concrete Descriptions of Associations: Intact, concrete Thought Content: Hallucinations: Denies AH, VH and does not present responding to stimuli Delusions: No paranoia or other delusions noted Suicidal Thoughts: Denies SI, intention, plan  Homicidal Thoughts: Denies eyes HI, intention, plan   Alertness/Orientation: Alert and oriented  Insight: Limited Judgment: Limited  Memory:  Limited  Executive Functions  Concentration: Fair Attention Span: Fair Recall: [employer] of Knowledge: Fair   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    Review of Systems  All other systems reviewed and are negative.  Blood pressure (!) 130/98, pulse 98, temperature 97.9 F (36.6 C), temperature source Oral, resp. rate 20, height 6\' 2"  (1.88 m), weight 130.2 kg, SpO2 97 %. Body mass index is 36.85 kg/m.   Treatment Plan Summary:  ASSESSMENT:  Diagnoses / Active Problems: Principal Problem: Schizoaffective disorder, bipolar type ([place]) Diagnosis: Principal Problem:   Schizoaffective disorder, bipolar type ([place]) Active Problems:   Cannabis use disorder   OSA (obstructive sleep apnea)   Prediabetes   Tobacco use disorder   Essential hypertension   Hyperlipidemia   PLAN: Safety and Monitoring:  -- Involuntary admission to inpatient psychiatric unit for safety, stabilization and treatment  -- Daily contact with patient to assess and evaluate symptoms and progress in treatment  -- Patient's case to be discussed in multi-disciplinary team meeting  -- Observation Level : q15 minute checks  -- Vital signs:  q12 hours  -- Precautions: suicide, elopement, and assault  2. Medications:   Continue Depakote 1000 mg at bedtime for mood stabilization  Continue Haldol 10 mg in the morning and 50 mg at bedtime for mood and psychosis, patient refusing Haldol decanoate injection  Continue Cogentin 0.5 mg twice daily for EPS prophylaxis  Continue Singulair for allergic rhinitis  Continue Lipitor 20 mg at bedtime for hypercholesterolemia  Continue Norvasc 10 mg daily for hypertension  Titrate Inderal LA from 80-120 mg at bedtime for hypertension and tachycardia, monitor effects and safety and adjust dosing accordingly   The  risks/benefits/side-effects/alternatives to this medication were discussed in detail with the patient and time was given for questions. The patient consents to medication trial.    -- Metabolic profile and EKG monitoring obtained while on an atypical antipsychotic (BMI: Lipid Panel: HbgA1c: QTc:)      3. Pertinent labs: Labwork reviewed, Depakote level on 10/2 low therapeutic 43      Lab ordered: None  4. Group and Therapy: -- Encouraged patient to participate in unit milieu and in scheduled group therapies     -- Short Term Goals: Ability to identify  changes in lifestyle to reduce recurrence of condition will improve, Ability to verbalize feelings will improve, Ability to disclose and discuss suicidal ideas, Ability to demonstrate self-control will improve, and Ability to identify and develop effective coping behaviors will improve  -- Long Term Goals: Improvement in symptoms so as ready for discharge  6. Discharge Planning:   -- Social work and case management to assist with discharge planning and identification of hospital follow-up needs prior to discharge  -- Estimated LOS: 5-7 days  -- Discharge Concerns: Need to establish a safety plan; Medication compliance and effectiveness  -- Discharge Goals: Return home with outpatient referrals for mental health follow-up including medication management/psychotherapy      Total Time Spent in Direct Patient Care:  I personally spent 35 minutes on the unit in direct patient care. The direct patient care time included face-to-face time with the patient, reviewing the patient's chart, communicating with other professionals, and coordinating care. Greater than 50% of this time was spent in counseling or coordinating care with the patient regarding goals of hospitalization, psycho-education, and discharge planning needs.   Makalia Bare Abbott Pao, MD 07/06/2022, 10:38 AM

## 2022-07-06 NOTE — Progress Notes (Signed)
[facility] MD Progress Note  07/06/2022 10:36 AM Gregory Saunders  MRN:  [id number]   Reason for Admission:  Gregory Saunders is a 26 y.o., male with PMH significant for schizoaffective disorder-bipolar type, cannabis use disorder, tobacco use disorder, who presented to [facility] (06/08/2022) via parents for assaulting parents and bizarre behaviors, then admitted to [facility] (06/13/2022). Involuntary treatment of acute psychosis in the setting of medication non-compliance. The patient is currently on Hospital Day 23.   Chart Review from last 24 hours:  The patient's chart was reviewed and nursing notes were reviewed. The patient's case was discussed in multidisciplinary team meeting. Per [facility] patient is compliant with his scheduled medication including psychotropic medications Cogentin 0.5 mg twice daily, Depakote 1000 mg at bedtime, Haldol 10 mg in the morning and 50 mg at bedtime, no need for as needed medication for agitation or aggression noted or reported, using trazodone as needed for sleep intermittently last use 10/4.  Patient reported by staff to be denying any SI HI or AVH almost all the time, cooperative in general with no agitation reported but secluding himself to the room not attending groups despite encouragement.  Chart review continues to indicate elevated blood pressure readings as well as occasional tachycardia.  Patient is on Norvasc for hypertension 10 mg daily, also started on metoprolol LA last night, blood pressure continues to be elevated, will titrate dose up and follow.  Information Obtained Today During Patient Interview: The patient was seen and evaluated on the unit.  Patient was evaluated in his room he is lying down in bed withdrawn but answering questions appropriately in a linear manner, continues to report fair mood denies depressed mood or anxiety, denies SI HI or AVH denies paranoia or other delusions.  I did counsel patient regarding need to attend  groups at least twice daily, will follow.  Patient denies side effect to medications.  Discussed with patient current referral process to group homes in the area and awaiting response.  Sleep  Sleep: Good sleep reported by patient   Principal Problem: Schizoaffective disorder, bipolar type (HCC) Diagnosis: Principal Problem:   Schizoaffective disorder, bipolar type (HCC) Active Problems:   Cannabis use disorder   OSA (obstructive sleep apnea)   Prediabetes   Tobacco use disorder   Essential hypertension   Hyperlipidemia    Past Psychiatric History: See H&P  Past Medical History:  Past Medical History:  Diagnosis Date   Cannabis use disorder 10/16/2020   Schizophrenia (HCC)    Strain of right Achilles tendon 04/07/2019   Tobacco use disorder 06/14/2022    Past Surgical History:  Procedure Laterality Date   stye removal     WISDOM TOOTH EXTRACTION     Family History: History reviewed. No pertinent family history. Family Psychiatric  History: See H&P Social History: See H&P  Sleep: Good  Appetite: Good  Current Medications: Current Facility-Administered Medications  Medication Dose Route Frequency Provider Last Rate Last Admin   acetaminophen (TYLENOL) tablet 650 mg  650 mg Oral Q6H PRN Ajibola, Ene A, NP       alum & mag hydroxide-simeth (MAALOX/MYLANTA) 200-200-20 MG/5ML suspension 30 mL  30 mL Oral Q4H PRN Ajibola, Ene A, NP       amLODipine (NORVASC) tablet 10 mg  10 mg Oral Daily Park Pope, MD   10 mg at 07/06/22 0815   atorvastatin (LIPITOR) tablet 20 mg  20 mg Oral QHS Princess Bruins, DO   20 mg at 07/05/22 2103  benztropine (COGENTIN) tablet 0.5 mg  0.5 mg Oral BID Park Pope, MD   0.5 mg at 07/06/22 0815   haloperidol lactate (HALDOL) injection 5 mg  5 mg Intramuscular Q6H PRN Park Pope, MD       And   LORazepam (ATIVAN) injection 2 mg  2 mg Intramuscular Q6H PRN Park Pope, MD       And   diphenhydrAMINE (BENADRYL) injection 50 mg  50 mg Intravenous Q6H  PRN Park Pope, MD       divalproex (DEPAKOTE ER) 24 hr tablet 1,000 mg  1,000 mg Oral QHS Mason Jim, Amy E, MD   1,000 mg at 07/05/22 2102   docusate sodium (COLACE) capsule 100 mg  100 mg Oral Daily PRN Comer Locket, MD       haloperidol (HALDOL) tablet 10 mg  10 mg Oral Q1200 Park Pope, MD   10 mg at 07/06/22 0815   haloperidol (HALDOL) tablet 15 mg  15 mg Oral Seabron Spates, MD   15 mg at 07/05/22 2103   haloperidol (HALDOL) tablet 5 mg  5 mg Oral Q8H PRN Park Pope, MD       And   LORazepam (ATIVAN) tablet 1 mg  1 mg Oral Q8H PRN Park Pope, MD       magnesium hydroxide (MILK OF MAGNESIA) suspension 30 mL  30 mL Oral Daily PRN Ajibola, Ene A, NP       montelukast (SINGULAIR) tablet 10 mg  10 mg Oral QHS Princess Bruins, DO   10 mg at 07/05/22 2102   multivitamin with minerals tablet 1 tablet  1 tablet Oral Daily Comer Locket, MD   1 tablet at 07/06/22 0815   propranolol ER (INDERAL LA) 24 hr capsule 80 mg  80 mg Oral QHS Bodi Palmeri, MD   80 mg at 07/05/22 2102   thiamine (Vitamin B-1) tablet 100 mg  100 mg Oral Daily Mason Jim, Amy E, MD   100 mg at 07/06/22 0815   traZODone (DESYREL) tablet 50 mg  50 mg Oral QHS PRN Comer Locket, MD   50 mg at 07/04/22 2059    Lab Results: No results found for this or any previous visit (from the past 48 hour(s)).  Blood Alcohol level:  Lab Results  Component Value Date   ETH <10 06/08/2022   ETH <10 06/20/2021    Metabolic Disorder Labs: Lab Results  Component Value Date   HGBA1C 5.8 (H) 06/24/2022   MPG 119.76 06/24/2022   No results found for: "PROLACTIN" Lab Results  Component Value Date   CHOL 186 06/13/2022   TRIG 272 (H) 06/13/2022   HDL 34 (L) 06/13/2022   CHOLHDL 5.5 06/13/2022   VLDL 54 (H) 06/13/2022   LDLCALC 98 06/13/2022    Physical Findings: AIMS: Facial and Oral Movements Muscles of Facial Expression: None, normal Lips and Perioral Area: None, normal Jaw: None, normal Tongue: None, normal,Extremity  Movements Upper (arms, wrists, hands, fingers): None, normal Lower (legs, knees, ankles, toes): None, normal, Trunk Movements Neck, shoulders, hips: None, normal, Overall Severity Severity of abnormal movements (highest score from questions above): None, normal Incapacitation due to abnormal movements: None, normal Patient's awareness of abnormal movements (rate only patient's report): No Awareness, Dental Status Current problems with teeth and/or dentures?: No Does patient usually wear dentures?: No  CIWA:  CIWA-Ar Total: 0 COWS:     Musculoskeletal: Strength & Muscle Tone: within normal limits Gait & Station: normal Patient leans: N/A  Psychiatric Specialty Exam:  General Appearance: Appears at stated age below average hygiene, unkempt casually dressed  Behavior: Guarded but cooperative in general, calm with no agitation or anxiety noted  Psychomotor Activity: No psychomotor agitation or retardation noted  Eye Contact: Limited Speech: Decreased amount   Mood: Euthymic Affect: Restricted affect at baseline  Thought Process: Linear and goal-directed yet concrete Descriptions of Associations: Intact, concrete Thought Content: Hallucinations: Denies AH, VH and does not present responding to stimuli Delusions: No paranoia or other delusions noted Suicidal Thoughts: Denies SI, intention, plan  Homicidal Thoughts: Denies eyes HI, intention, plan   Alertness/Orientation: Alert and oriented  Insight: Limited Judgment: Limited  Memory: Limited  Executive Functions  Concentration: Fair Attention Span: Fair Recall: [employer] of Knowledge: Fair   Assets  Assets: Manufacturing systems engineer; Desire for Improvement; Resilience; Physical Health; Social Support    Physical Exam: Physical Exam Vitals and nursing note reviewed.    Review of Systems  All other systems reviewed and are negative.  Blood pressure (!) 130/98, pulse 98, temperature 97.9 F (36.6 C), temperature  source Oral, resp. rate 20, height 6\' 2"  (1.88 m), weight 130.2 kg, SpO2 97 %. Body mass index is 36.85 kg/m.   Treatment Plan Summary:  ASSESSMENT:  Diagnoses / Active Problems: Principal Problem: Schizoaffective disorder, bipolar type (HCC) Diagnosis: Principal Problem:   Schizoaffective disorder, bipolar type (HCC) Active Problems:   Cannabis use disorder   OSA (obstructive sleep apnea)   Prediabetes   Tobacco use disorder   Essential hypertension   Hyperlipidemia   PLAN: Safety and Monitoring:  -- Involuntary admission to inpatient psychiatric unit for safety, stabilization and treatment  -- Daily contact with patient to assess and evaluate symptoms and progress in treatment  -- Patient's case to be discussed in multi-disciplinary team meeting  -- Observation Level : q15 minute checks  -- Vital signs:  q12 hours  -- Precautions: suicide, elopement, and assault  2. Medications:   Continue Depakote 1000 mg at bedtime for mood stabilization  Continue Haldol 10 mg in the morning and 50 mg at bedtime for mood and psychosis, patient refusing Haldol decanoate injection  Continue Cogentin 0.5 mg twice daily for EPS prophylaxis  Continue Singulair for allergic rhinitis  Continue Lipitor 20 mg at bedtime for hypercholesterolemia  Continue Norvasc 10 mg daily for hypertension  Titrate Inderal LA from 60-80 mg at bedtime for hypertension and tachycardia, monitor effects and safety and adjust dosing accordingly   The risks/benefits/side-effects/alternatives to this medication were discussed in detail with the patient and time was given for questions. The patient consents to medication trial.    -- Metabolic profile and EKG monitoring obtained while on an atypical antipsychotic (BMI: Lipid Panel: HbgA1c: QTc:)      3. Pertinent labs: Labwork reviewed, Depakote level on 10/2 low therapeutic 43      Lab ordered: None  4. Group and Therapy: -- Encouraged patient to participate in  unit milieu and in scheduled group therapies     -- Short Term Goals: Ability to identify changes in lifestyle to reduce recurrence of condition will improve, Ability to verbalize feelings will improve, Ability to disclose and discuss suicidal ideas, Ability to demonstrate self-control will improve, and Ability to identify and develop effective coping behaviors will improve  -- Long Term Goals: Improvement in symptoms so as ready for discharge  6. Discharge Planning:   -- Social work and case management to assist with discharge planning and identification of hospital follow-up needs prior  to discharge  -- Estimated LOS: 5-7 days  -- Discharge Concerns: Need to establish a safety plan; Medication compliance and effectiveness  -- Discharge Goals: Return home with outpatient referrals for mental health follow-up including medication management/psychotherapy      Total Time Spent in Direct Patient Care:  I personally spent 35 minutes on the unit in direct patient care. The direct patient care time included face-to-face time with the patient, reviewing the patient's chart, communicating with other professionals, and coordinating care. Greater than 50% of this time was spent in counseling or coordinating care with the patient regarding goals of hospitalization, psycho-education, and discharge planning needs.   Michaelann Gunnoe Winfred Leeds, MD 07/06/2022, 10:36 AM

## 2022-07-06 NOTE — [facility] Counselor (Signed)
[facility] LCSW Progress Note   07/06/2022    3:53 PM  GAYLON BENTZ   [id number]   Type of Contact and Topic:  Group Home Placement  CSW called [facility], Ainsley Spinner, who expressed concerns about patient drug use.  CSW reviewed chart and discussed only recent substance use was THC.  Manager discussed wanting to have a phone conversation with patient regarding expectations of group home and if he was a good fit. Group home manager asked if he could call at 10am tomorrow for screener.       Signed:  Riki Altes MSW, [place], LCAS 07/06/2022 3:53 PM

## 2022-07-06 NOTE — Progress Notes (Signed)
Adult Psychoeducational Group Note  Date:  07/06/2022 Time:  8:43 PM  Group Topic/Focus:  Wrap-Up Group:   The focus of this group is to help patients review their daily goal of treatment and discuss progress on daily workbooks.  Participation Level:  Did Not Attend  Participation Quality:  Did Not Attend  Affect:  Did Not Attend  Cognitive:  Did Not Attend  Insight: Did Not Attend  Engagement in Group:  Did Not Attend  Modes of Intervention:  Did Not Attend  Additional Comments:   Pt was encouraged to attend group discussion but refused.   Gerhard Perches 07/06/2022, 8:43 PM

## 2022-07-07 MED ORDER — HYDROCHLOROTHIAZIDE 12.5 MG PO TABS
12.5000 mg | ORAL_TABLET | Freq: Every day | ORAL | Status: DC
  Administered 2022-07-08 – 2022-07-09 (×2): 12.5 mg via ORAL
  Filled 2022-07-07: qty 1
  Filled 2022-07-07 (×2): qty 7
  Filled 2022-07-07: qty 1

## 2022-07-07 NOTE — Progress Notes (Signed)
   07/07/22 0800  Psych Admission Type (Psych Patients Only)  Admission Status Voluntary  Psychosocial Assessment  Patient Complaints Isolation  Eye Contact Brief  Facial Expression Blank  Affect Appropriate to circumstance  Speech Logical/coherent  Interaction Assertive  Motor Activity Slow  Appearance/Hygiene Unremarkable  Behavior Characteristics Guarded  Mood Pleasant  Aggressive Behavior  Effect No apparent injury  Thought Process  Coherency WDL  Content WDL  Delusions WDL  Perception WDL  Hallucination None reported or observed  Judgment Poor  Confusion WDL  Danger to Self  Current suicidal ideation? Denies  Agreement Not to Harm Self Yes  Description of Agreement verbal  Danger to Others  Danger to Others None reported or observed

## 2022-07-07 NOTE — Progress Notes (Signed)
Adult Psychoeducational Group Note  Date:  07/07/2022 Time:  8:43 PM  Group Topic/Focus:  Wrap-Up Group:   The focus of this group is to help patients review their daily goal of treatment and discuss progress on daily workbooks.  Participation Level:  Did Not Attend  Participation Quality:   Did Not Attend  Affect:   Did Not Attend  Cognitive:   Did Not Attend  Insight: None  Engagement in Group:   Did Not Attend  Modes of Intervention:   Did Not Attend  Additional Comments:  Pt was encouraged to attend wrap up group but did not attend.  [name] 07/07/2022, 8:43 PM
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
## 2022-07-07 NOTE — Plan of Care (Signed)

## 2022-07-07 NOTE — BHH Counselor (Signed)
BHH/BMU LCSW Progress Note   07/07/2022    11:04 AM  SHAFER SWAMY   [id number]   Type of Contact and Topic:  Group Home placement  CSW provided number for Timmy Rogers with [facility] to patient.  Patient agreed to call to participate in a screener  CSW also received phone call from [facility] in [facility] stating they had bed availability.  CSW provided patient history and sent referral to house manager Romie Minus.  Referral sent to [email or web address].     Signed:  Riki Altes MSW, LCSW, LCAS 07/07/2022 11:04 AM

## 2022-07-07 NOTE — Progress Notes (Signed)
     07/06/22 2121  Psych Admission Type (Psych Patients Only)  Admission Status Voluntary  Psychosocial Assessment  Patient Complaints Isolation  Eye Contact Brief  Facial Expression Blank  Affect Appropriate to circumstance  Speech Logical/coherent  Interaction Assertive  Motor Activity Slow  Appearance/Hygiene Unremarkable  Behavior Characteristics Guarded  Mood Pleasant  Thought Process  Coherency WDL  Content WDL  Delusions None reported or observed  Perception WDL  Hallucination None reported or observed  Judgment Poor  Confusion WDL  Danger to Self  Current suicidal ideation? Denies  Agreement Not to Harm Self Yes  Description of Agreement verbal  Danger to Others  Danger to Others None reported or observed

## 2022-07-07 NOTE — Group Note (Signed)
Recreation Therapy Group Note   Group Topic:Health and Wellness  Group Date: 07/07/2022 Start Time: 1000 End Time: 1030 Facilitators: Brantly Kalman-McCall, LRT,CTRS Location: [address] Dayroom   Goal Area(s) Addresses:  Patient will verbalize benefit of exercise during group session. Patient will identify an exercise that can be completed post d/c. Patient will acknowledge benefits of exercise when used as a coping mechanism.   Group Description:  Exercise.  LRT and patients went over the importance of physical health and how it impacts overall wellbeing.  LRT and patients went through a series of stretches to loosen the muscles.  Patients then took turns leading the group in any exercise of their choosing.  LRT expressed to patients to take breaks or get water as needed.  LRT and patients were going for at least 30 minutes of exercise.   Affect/Mood: N/A   Participation Level: Did not attend    Clinical Observations/Individualized Feedback:     Plan: Continue to engage patient in RT group sessions 2-3x/week.   Domonick Sittner-McCall, LRT,CTRS 07/07/2022 12:26 PM

## 2022-07-07 NOTE — Plan of Care (Signed)
  Problem: Education: Goal: Emotional status will improve Outcome: Progressing Goal: Mental status will improve Outcome: Progressing   Problem: Coping: Goal: Ability to verbalize frustrations and anger appropriately will improve Outcome: Progressing   Problem: Activity: Goal: Interest or engagement in activities will improve Outcome: Not Progressing

## 2022-07-08 ENCOUNTER — Encounter (HOSPITAL_COMMUNITY): Payer: Self-pay

## 2022-07-08 MED ORDER — PROPRANOLOL HCL ER 120 MG PO CP24: 120.0000 mg | ORAL_CAPSULE | Freq: Every day | ORAL | 0 refills | Status: DC

## 2022-07-08 MED ORDER — AMLODIPINE BESYLATE 10 MG PO TABS: 10.0000 mg | ORAL_TABLET | Freq: Every day | ORAL | 0 refills | Status: DC

## 2022-07-08 MED ORDER — BENZTROPINE MESYLATE 0.5 MG PO TABS: 0.5000 mg | ORAL_TABLET | Freq: Two times a day (BID) | ORAL | 0 refills | Status: DC

## 2022-07-08 MED ORDER — HALOPERIDOL 5 MG PO TABS: 15.0000 mg | ORAL_TABLET | Freq: Every day | ORAL | 0 refills | Status: DC

## 2022-07-08 MED ORDER — HALOPERIDOL 10 MG PO TABS: 10.0000 mg | ORAL_TABLET | Freq: Every day | ORAL | 0 refills | Status: DC

## 2022-07-08 MED ORDER — DIVALPROEX SODIUM ER 500 MG PO TB24: 1000.0000 mg | ORAL_TABLET | Freq: Every day | ORAL | 0 refills | Status: DC

## 2022-07-08 MED ORDER — ATORVASTATIN CALCIUM 20 MG PO TABS: 20.0000 mg | ORAL_TABLET | Freq: Every day | ORAL | 0 refills | Status: DC

## 2022-07-08 MED ORDER — HYDROCHLOROTHIAZIDE 12.5 MG PO TABS: 12.5000 mg | ORAL_TABLET | Freq: Every day | ORAL | 0 refills | Status: DC

## 2022-07-08 MED ORDER — MONTELUKAST SODIUM 10 MG PO TABS: 10.0000 mg | ORAL_TABLET | Freq: Every day | ORAL | 0 refills | Status: DC

## 2022-07-08 NOTE — Progress Notes (Signed)
   07/08/22 0559  Sleep  Number of Hours 8

## 2022-07-08 NOTE — Group Note (Signed)
Recreation Therapy Group Note   Group Topic:Other  Group Date: 07/08/2022 Start Time: 1400 End Time: 1430 Facilitators: Mory Herrman-McCall, LRT,CTRS Location: [address] Dayroom   Goal Area(s) Addresses:  Patient will engage in pro-social way in music group.  Patient will follow directions of drum leader on the first prompt. Patient will demonstrate no behavioral issues during group.  Patient will identify if a reduction in stress level occurs as a result of participation in therapeutic drum circle.     Activity Description/Intervention: Therapeutic Drumming. Patients with peers and staff were given the opportunity to engage in a leader facilitated [place] with staff from the [employer], in partnership with [employer]. Nurse, adult and trained [employer], Devin Going leading with LRT observing and documenting intervention and pt response. This evidenced-based practice targets 7 areas of health and wellbeing in the human experience including: stress-reduction, exercise, self-expression, camaraderie/support, nurturing, spirituality, and music-making (leisure).    Affect/Mood: Appropriate   Participation Level: Engaged   Participation Quality: Independent   Behavior: Appropriate     Clinical Observations/Individualized Feedback: Patient actively engaged in therapeutic drumming exercise and discussions. Pt was appropriate with peers, staff, and musical equipment for duration of programming.     Plan: Continue to engage patient in RT group sessions 2-3x/week.   Genee Rann-McCall, LRT,CTRS 07/08/2022 3:10 PM

## 2022-07-08 NOTE — Progress Notes (Signed)
Patient requested ensure order if appropriate.

## 2022-07-08 NOTE — Progress Notes (Signed)
D:  Patient denied SI and HI, contracts for safety.  Denied A/V hallucinations.  Denied pain. A:  Medications administered per MD orders.  Emotional support and encouragement given patient. R:  Safety maintained with 15 minute checks.  

## 2022-07-08 NOTE — Progress Notes (Signed)
The patient attended the evening N.A.meeting and was appropriate.  

## 2022-07-08 NOTE — BHH Group Notes (Signed)
Adult Psychoeducational Group Note  Date:  07/08/2022 Time:  10:55 AM  Group Topic/Focus:  Goals Group:   The focus of this group is to help patients establish daily goals to achieve during treatment and discuss how the patient can incorporate goal setting into their daily lives to aide in recovery.  Participation Level:  Did Not Attend  Kern Reap 07/08/2022, 10:55 AM

## 2022-07-08 NOTE — BHH Counselor (Addendum)
BHH/BMU LCSW Progress Note   07/08/2022    10:18 AM  Gregory Saunders   [id number]   Type of Contact and Topic:  Group Home Placement: [facility]  CSW spoke with group home manager, Ainsley Spinner.  They reported that they have bed availability and that they can receive him tomorrow at 12pm as long as payment can be processed through mother.  CSW called mother who agreed to call group home manager, Ainsley Spinner to coordinate payment.  Prescriptions will need to be sent to [facility] in [place].    Appt for CST assessment will be next Tuesday on 10/17 at noon.  Group home requested that appt be scheduled next week so they can set him up with transportation through Medicaid to get to his appt.   Addendum: CSW also left message with patient DSS worker, Jaynee Eagles, [place], to notify of placement of patient into a group home.  CSW left message with contact information to call CSW back for additional information.   Signed:  Riki Altes, MSW, LCSW, LCAS 07/08/2022 10:18 AM

## 2022-07-08 NOTE — BH IP Treatment Plan (Signed)
Interdisciplinary Treatment and Diagnostic Plan Update  07/08/2022 Time of Session: 0830 Gregory Saunders MRN: [id number]  Principal Diagnosis: Schizoaffective disorder, bipolar type [facility])  Secondary Diagnoses: Principal Problem:   Schizoaffective disorder, bipolar type ([place]) Active Problems:   Cannabis use disorder   OSA (obstructive sleep apnea)   Prediabetes   Tobacco use disorder   Essential hypertension   Hyperlipidemia   Current Medications:  Current Facility-Administered Medications  Medication Dose Route Frequency Provider Last Rate Last Admin   acetaminophen (TYLENOL) tablet 650 mg  650 mg Oral Q6H PRN Ajibola, Ene A, NP       alum & mag hydroxide-simeth (MAALOX/MYLANTA) 200-200-20 MG/5ML suspension 30 mL  30 mL Oral Q4H PRN Ajibola, Ene A, NP       amLODipine (NORVASC) tablet 10 mg  10 mg Oral Daily France Ravens, MD   10 mg at 07/08/22 0803   atorvastatin (LIPITOR) tablet 20 mg  20 mg Oral QHS Merrily Brittle, DO   20 mg at 07/07/22 2046   benztropine (COGENTIN) tablet 0.5 mg  0.5 mg Oral BID France Ravens, MD   0.5 mg at 07/08/22 0803   haloperidol lactate (HALDOL) injection 5 mg  5 mg Intramuscular Q6H PRN France Ravens, MD       And   LORazepam (ATIVAN) injection 2 mg  2 mg Intramuscular Q6H PRN France Ravens, MD       And   diphenhydrAMINE (BENADRYL) injection 50 mg  50 mg Intravenous Q6H PRN France Ravens, MD       divalproex (DEPAKOTE ER) 24 hr tablet 1,000 mg  1,000 mg Oral QHS Singleton, Amy E, MD   1,000 mg at 07/07/22 2046   docusate sodium (COLACE) capsule 100 mg  100 mg Oral Daily PRN Harlow Asa, MD       haloperidol (HALDOL) tablet 10 mg  10 mg Oral Q1200 France Ravens, MD   10 mg at 07/08/22 0803   haloperidol (HALDOL) tablet 15 mg  15 mg Oral Aliene Altes, MD   15 mg at 07/07/22 2046   haloperidol (HALDOL) tablet 5 mg  5 mg Oral Q8H PRN France Ravens, MD       And   LORazepam (ATIVAN) tablet 1 mg  1 mg Oral Q8H PRN France Ravens, MD       hydrochlorothiazide  (HYDRODIURIL) tablet 12.5 mg  12.5 mg Oral Daily Nkwenti, Doris, NP   12.5 mg at 07/08/22 0803   magnesium hydroxide (MILK OF MAGNESIA) suspension 30 mL  30 mL Oral Daily PRN Ajibola, Ene A, NP       montelukast (SINGULAIR) tablet 10 mg  10 mg Oral QHS Merrily Brittle, DO   10 mg at 07/07/22 2047   multivitamin with minerals tablet 1 tablet  1 tablet Oral Daily Harlow Asa, MD   1 tablet at 07/08/22 0803   propranolol ER (INDERAL LA) 24 hr capsule 120 mg  120 mg Oral QHS Attiah, Nadir, MD   120 mg at 07/07/22 2046   thiamine (Vitamin B-1) tablet 100 mg  100 mg Oral Daily Nelda Marseille, Amy E, MD   100 mg at 07/08/22 0803   traZODone (DESYREL) tablet 50 mg  50 mg Oral QHS PRN Harlow Asa, MD   50 mg at 07/04/22 2059   PTA Medications: Medications Prior to Admission  Medication Sig Dispense Refill Last Dose   OLANZapine (ZYPREXA) 10 MG tablet Take 1 tablet (10 mg total) by mouth at bedtime. 30 tablet 2  Past Month   risperiDONE (RISPERDAL) 3 MG tablet Take 3 mg by mouth 2 (two) times daily.   Past Month    Patient Stressors: Marital or family conflict   Medication change or noncompliance   Occupational concerns   Substance abuse    Patient Strengths: Active sense of humor  Physical Health  Work skills   Treatment Modalities: Medication Management, Group therapy, Case management,  1 to 1 session with clinician, Psychoeducation, Recreational therapy.   Physician Treatment Plan for Primary Diagnosis: Schizoaffective disorder, bipolar type (HCC) Long Term Goal(s): Improvement in symptoms so as ready for discharge   Short Term Goals: Ability to identify changes in lifestyle to reduce recurrence of condition will improve Ability to verbalize feelings will improve Ability to disclose and discuss suicidal ideas Ability to demonstrate self-control will improve Ability to identify and develop effective coping behaviors will improve  Medication Management: Evaluate patient's response, side  effects, and tolerance of medication regimen.  Therapeutic Interventions: 1 to 1 sessions, Unit Group sessions and Medication administration.  Evaluation of Outcomes: Progressing  Physician Treatment Plan for Secondary Diagnosis: Principal Problem:   Schizoaffective disorder, bipolar type (HCC) Active Problems:   Cannabis use disorder   OSA (obstructive sleep apnea)   Prediabetes   Tobacco use disorder   Essential hypertension   Hyperlipidemia  Long Term Goal(s): Improvement in symptoms so as ready for discharge   Short Term Goals: Ability to identify changes in lifestyle to reduce recurrence of condition will improve Ability to verbalize feelings will improve Ability to disclose and discuss suicidal ideas Ability to demonstrate self-control will improve Ability to identify and develop effective coping behaviors will improve     Medication Management: Evaluate patient's response, side effects, and tolerance of medication regimen.  Therapeutic Interventions: 1 to 1 sessions, Unit Group sessions and Medication administration.  Evaluation of Outcomes: Progressing   RN Treatment Plan for Primary Diagnosis: Schizoaffective disorder, bipolar type (HCC) Long Term Goal(s): Knowledge of disease and therapeutic regimen to maintain health will improve  Short Term Goals: Ability to remain free from injury will improve, Ability to verbalize frustration and anger appropriately will improve, Ability to demonstrate self-control, Ability to participate in decision making will improve, Ability to verbalize feelings will improve, Ability to disclose and discuss suicidal ideas, Ability to identify and develop effective coping behaviors will improve, and Compliance with prescribed medications will improve  Medication Management: RN will administer medications as ordered by provider, will assess and evaluate patient's response and provide education to patient for prescribed medication. RN will report  any adverse and/or side effects to prescribing provider.  Therapeutic Interventions: 1 on 1 counseling sessions, Psychoeducation, Medication administration, Evaluate responses to treatment, Monitor vital signs and CBGs as ordered, Perform/monitor CIWA, COWS, AIMS and Fall Risk screenings as ordered, Perform wound care treatments as ordered.  Evaluation of Outcomes: Progressing   LCSW Treatment Plan for Primary Diagnosis: Schizoaffective disorder, bipolar type (HCC) Long Term Goal(s): Safe transition to appropriate next level of care at discharge, Engage patient in therapeutic group addressing interpersonal concerns.  Short Term Goals: Engage patient in aftercare planning with referrals and resources, Increase social support, Increase ability to appropriately verbalize feelings, Increase emotional regulation, Facilitate acceptance of mental health diagnosis and concerns, Facilitate patient progression through stages of change regarding substance use diagnoses and concerns, Identify triggers associated with mental health/substance abuse issues, and Increase skills for wellness and recovery  Therapeutic Interventions: Assess for all discharge needs, 1 to 1 time with Child psychotherapist,  Explore available resources and support systems, Assess for adequacy in community support network, Educate family and significant other(s) on suicide prevention, Complete Psychosocial Assessment, Interpersonal group therapy.  Evaluation of Outcomes: Progressing   Progress in Treatment: Attending groups: Yes. Participating in groups: Yes. Taking medication as prescribed: Yes. Toleration medication: Yes. Family/Significant other contact made: Yes, individual(s) contacted:  Gregory Saunders, [phone] Patient understands diagnosis: Yes. Discussing patient identified problems/goals with staff: Yes. Medical problems stabilized or resolved: Yes. Denies suicidal/homicidal ideation: No. Issues/concerns per patient  self-inventory: Yes. Other: none  New problem(s) identified: No, Describe:  none  New Short Term/Long Term Goal(s): Patient to work towards elimination of symptoms of psychosis, medication management for mood stabilization; elimination of SI thoughts; development of comprehensive mental wellness plan.  Patient Goals:  No additional goals identified at this time. Patient to continue to work towards original goals identified in initial treatment team meeting. CSW will remain available to patient should they voice additional treatment goals.   Discharge Plan or Barriers: CSW team to discuss placement options with all stakeholders.   Reason for Continuation of Hospitalization: Other; describe psychosis  Estimated Length of Stay: 1-7 days   Scribe for Treatment Team: Almedia Balls 07/08/2022 11:39 AM

## 2022-07-08 NOTE — Group Note (Signed)
Recreation Therapy Group Note   Group Topic:Leisure Education  Group Date: 07/08/2022 Start Time: 1000 End Time: 1040 Facilitators: Deane Wattenbarger-McCall, LRT,CTRS Location: [address] Day[address]   Goal Area(s) Addresses:  Patient will successfully identify positive leisure and recreation activities.  Patient will acknowledge benefits of participation in healthy leisure activities post discharge.  Patient will actively work with peers toward a shared goal.   Group Description: Pictionary. In groups of 5-7, patients took turns trying to guess the picture being drawn on the board by their teammate.  If the team guessed the correct answer, they won a point.  If the team guessed wrong, the other team got a chance to steal the point. After several rounds of game play, the team with the most points were declared winners. Post-activity discussion reviewed benefits of positive recreation outlets: reducing stress, improving coping mechanisms, increasing self-esteem, and building larger support systems.    Affect/Mood: Appropriate   Participation Level: Engaged   Participation Quality: Independent   Behavior: Appropriate   Speech/Thought Process: Focused   Insight: Good   Judgement: Good   Modes of Intervention: Competitive Play   Patient Response to Interventions:  Engaged   Education Outcome:  Acknowledges education and In group clarification offered    Clinical Observations/Individualized Feedback: Pt was bright and engaged.  Pt described leisure as something that is done on your own.  Pt appeared to be very upbeat and interacted well with peers.  Pt was very involved in activity.  Pt was appropriate and engaging throughout activity.    Plan: Continue to engage patient in RT group sessions 2-3x/week.   Amiee Wiley-McCall, LRT,CTRS 07/08/2022 10:48 AM

## 2022-07-08 NOTE — [facility] Group Notes (Signed)
Did not attend group 

## 2022-07-08 NOTE — Group Note (Signed)
LCSW Group Therapy Note   Group Date: 07/08/2022 Start Time: 1300 End Time: 1400  Type of Therapy and Topic: Group Therapy: Control  Participation Level: Active  Description of Group: In this group patients will discuss what is out of their control, what is somewhat in their control, and what is within their control.  They will be encouraged to explore what issues they can control and what issues are out of their control within their daily lives. They will be guided to discuss their thoughts, feelings, and behaviors related to these issues. The group will process together ways to better control things that are well within our own control and how to notice and accept the things that are not within our control. This group will be process-oriented, with patients participating in exploration of their own experiences as well as giving and receiving support and challenge from other group members.  During this group 2 worksheets will be provided to each patient to follow along and fill out.   Therapeutic Goals: 1. Patient will identify what is within their control and what is not within their control. 2. Patient will identify their thoughts and feelings about having control over their own lives. 3. Patient will identify their thoughts and feelings about not having control over everything in their lives.. 4. Patient will identify ways that they can have more control over their own lives. 5. Patient will identify areas were they can allow others to help them or provide assistance.  Summary of Patient Progress: The Pt attended group and remained there the entire time. The Pt accepted all worksheets and discussed the topic openly and appropriately with their peers.  The Pt was able to discuss what they have control of or do not have control of, the choices that they can make, and the possible consequences that may arise from their choices.  The Pt was able to identify possible anxiety about these things  and how they can manage that anxiety as well.   Darleen Crocker, LCSWA 07/08/2022  1:56 PM
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
## 2022-07-09 LAB — VALPROIC ACID LEVEL: Valproic Acid Lvl: 57 ug/mL (ref 50.0–100.0)

## 2022-07-09 NOTE — [facility] Suicide Risk Assessment (Signed)
Suicide Risk Assessment  Discharge Assessment    [facility] Discharge Suicide Risk Assessment   Principal Problem: Schizoaffective disorder, bipolar type [facility]) Discharge Diagnoses: Principal Problem:   Schizoaffective disorder, bipolar type (HCC) Active Problems:   Cannabis use disorder   OSA (obstructive sleep apnea)   Prediabetes   Tobacco use disorder   Essential hypertension   Hyperlipidemia  Reason For Admission: Gregory Saunders is a 26 y.o., male with PMH significant for schizoaffective disorder-bipolar type, cannabis use disorder, tobacco use disorder, who presented to [facility] (06/08/2022) via parents for assaulting foster parents and bizarre behaviors, then admitted to [facility] (06/13/2022) Involuntary treatment of acute psychosis in the setting of medication nonadherence and THC use.                                HOSPITAL COURSE During the patient's hospitalization, patient had extensive initial psychiatric evaluation, and follow-up psychiatric evaluations every day. Psychiatric diagnoses provided upon initial assessment were as follows: Principal Problem:  Schizoaffective disorder, bipolar type (HCC) Active Problems: Cannabis use disorder OSA (obstructive sleep apnea) Prediabetes Tobacco use disorder  Patient's psychiatric medications were adjusted on admission as follows: # Schizoaffective, bipolar type Home Rx included Zyprexa 10 mg daily and Risperdal 3 mg daily. At [facility] Risperdal was discontinued, and Zyprexa 10 mg continued. Increased Zyprexa 10 mg to 20 mg qHS - attempting to maximize one atypical antipsychotic if tolerated for monotherapy Continued cogentin 1 mg qHS  During the hospitalization, other adjustments were made to the patient's psychiatric medication regimen with medications at discharge being as follows: Schizoaffective disorder, bipolar type: Continue:  Haloperidol to 10 mg in AM and 15 mg at night for psychosis Cogentin 0.5 mg  bid for EPS prophylaxis  Metabolic profile and EKG monitoring obtained while on an atypical antipsychotic (BMI 36.83 Lipid Panel nonfasting: LDL 98, cholesterol 186, triglycerides 272, HDL 34 (06/13/2022), HbgA1c: 5.8, QTc: 450 (06/14/2022) and QTC (06/24/2022)) Depakote 1000 mg qhs for mood lability (med started 9/29; depakote trough level, CBC, hepatic function panel 06/29/22 show WNL). Repeat Valproic Acid level 07/09/22   Agitation protocol ordered PRN  .  07/09/22: Discharging to group home.    Medical Problems: -Elevated creatinine - improved Creatinine 1.33 > 1.20 (appears chronic compared to labs 11-12 months ago when creatinine was 1.35-1.46) Repeat BMP 9/27 shows creatinine 1.20 Encourage p.o. fluids   -Prediabetes A1c 5.8 06/24/22 - will need f/u with PCP after discharge for monitoring while on antipsychotic medication   -Hypercholesterolemia Continue Atorvastatin 20 mg qHS   -Incomplete right bundle branch block -OSA  Does have h/o OSA, would benefit from CPAP. Saw sleep clinic in June 2023.  Follow-up with PCP Would benefit from resuming sleep clinic f/u for CPAP Repeat EKG 9/27 NSR with Incomplete RBBB - f/u with PCP after discharge   -Tobacco Use Disorder                  -- Nicotine patch 21mg /24 hours ordered            -- Smoking cessation encouraged and illicit drug use  discouraged -Cannabis use disorder            -- Smoking cessation encouraged and illicit drug use  discouraged   -Seasonal allergies -Continued home singulair -Atopic dermatitis  -Hypertension Continue:   Amlodipine 10 mg  Propanolol ER 120 mg HR Hydrochlorothiazide 12.5 mg daily  Patient's care was  discussed during the interdisciplinary team meeting every day during the hospitalization. The patient denies having side effects to prescribed psychiatric medication. The patient was evaluated each day by a clinical provider to ascertain response to treatment. Improvement was noted by the  patient's report of decreasing symptoms, improved sleep and appetite, affect, medication tolerance, behavior, and participation in unit programming.  Patient was asked each day to complete a self inventory noting mood, mental status, pain, new symptoms, anxiety and concerns.    Symptoms were reported as significantly decreased or resolved completely by discharge. On day of discharge, the patient reports that their mood is stable. The patient denied having suicidal thoughts for more than 48 hours prior to discharge.  Patient denies having homicidal thoughts.  Patient denies having auditory hallucinations.  Patient denies any visual hallucinations or other symptoms of psychosis. The patient was motivated to continue taking medication with a goal of continued improvement in mental health.   The patient reports their target psychiatric symptoms of psychosis, anxiety and depressive symptoms responded well to the psychiatric medications, and the patient reports overall benefit other psychiatric hospitalization. Supportive psychotherapy was provided to the patient. The patient also participated in regular group therapy while hospitalized. Coping skills, problem solving as well as relaxation therapies were also part of the unit programming.  Labs were reviewed with the patient, and abnormal results were discussed with the patient.  Total Time spent with patient: 30 minutes  Musculoskeletal: Strength & Muscle Tone: within normal limits Gait & Station: normal Patient leans: N/A  Psychiatric Specialty Exam  Presentation  General Appearance:  Appropriate for Environment; Fairly Groomed  Eye Contact: Good  Speech: Clear and Coherent  Speech Volume: Normal  Handedness: Right   Mood and Affect  Mood: Euthymic  Duration of Depression Symptoms: Less than two weeks  Affect: Congruent  Thought Process  Thought Processes: Coherent  Descriptions of Associations:Intact  Orientation:Full  (Time, Place and Person)  Thought Content:Logical  History of Schizophrenia/Schizoaffective disorder:Yes  Duration of Psychotic Symptoms:Greater than six months  Hallucinations:Hallucinations: None  Ideas of Reference:None  Suicidal Thoughts:Suicidal Thoughts: No  Homicidal Thoughts:Homicidal Thoughts: No  Sensorium  Memory: Immediate Good  Judgment: Fair  Insight: Fair  Art therapist  Concentration: Good  Attention Span: Good  Recall: Good  Fund of Knowledge: Good  Language: Good  Psychomotor Activity  Psychomotor Activity: Psychomotor Activity: Normal  Assets  Assets: Communication Skills  Sleep  Sleep: Sleep: Good  Physical Exam: Physical Exam Constitutional:      Appearance: Normal appearance.  HENT:     Head: Normocephalic.     Nose: Nose normal. No congestion or rhinorrhea.  Eyes:     Pupils: Pupils are equal, round, and reactive to light.  Pulmonary:     Effort: Pulmonary effort is normal.  Musculoskeletal:        General: Normal range of motion.     Cervical back: Normal range of motion.  Neurological:     Mental Status: He is alert and oriented to person, place, and time.  Psychiatric:        Behavior: Behavior normal.        Thought Content: Thought content normal.    Review of Systems  Constitutional: Negative.  Negative for fever.  HENT: Negative.  Negative for sore throat.   Eyes: Negative.   Respiratory: Negative.  Negative for cough.   Cardiovascular: Negative.  Negative for chest pain.  Gastrointestinal: Negative.   Genitourinary: Negative.   Musculoskeletal: Negative.   Skin:  Negative.   Neurological: Negative.   Psychiatric/Behavioral:  Positive for substance abuse (Educated on the need to abstain from [facility] abuse). Negative for depression, hallucinations, memory loss and suicidal ideas. The patient is nervous/anxious (improved on current medications). The patient does not have insomnia.    Blood pressure  (!) 129/98, pulse 89, temperature 98.2 F (36.8 C), temperature source Oral, resp. rate 20, height 6\' 2"  (1.88 m), weight 130.2 kg, SpO2 93 %. Body mass index is 36.85 kg/m.  Mental Status Per Nursing Assessment::   On Admission:  NA  Demographic Factors:  Male and Low socioeconomic status  Loss Factors: NA  Historical Factors: NA  Risk Reduction Factors:   Living with another person, especially a relative  Continued Clinical Symptoms:  Patient currently denies SI, denies HI, denies AVH, denies paranoia and there is no evidence of delusional thinking. Pt is verbally contracting for safety outside of this [facility].  Cognitive Features That Contribute To Risk:  None    Suicide Risk:  Minimal: No identifiable suicidal ideation.  Patients presenting with no risk factors but with morbid ruminations; may be classified as minimal risk based on the severity of the depressive symptoms   Follow-up Information     [facility]. Schedule an appointment as soon as possible for a visit.   Why: Please call to schedule a hospital follow up for primary care services as soon as possible. Contact information: [address] [place] [address] [phone]         [facility] Follow up.   Why: You have been connected with a [employer].  You have an assessment appointment on 07/14/2022 at 12pm.  Please go in person to this provider to get connected with services. Contact information: [place] [address] [phone]                Plan Of Care/Follow-up recommendations:  The patient is able to verbalize their individual safety plan to this provider.  # It is recommended to the patient to continue psychiatric medications as prescribed, after discharge from the hospital.    # It is recommended to the patient to follow up with your outpatient psychiatric provider and PCP.  # It was discussed with the patient, the  impact of alcohol, drugs, tobacco have been there overall psychiatric and medical wellbeing, and total abstinence from substance use was recommended the patient.ed.  # Prescriptions provided or sent directly to preferred pharmacy at discharge. Patient agreeable to plan. Given opportunity to ask questions. Appears to feel comfortable with discharge.    # In the event of worsening symptoms, the patient is instructed to call the crisis hotline, 911 and or go to the nearest ED for appropriate evaluation and treatment of symptoms. To follow-up with primary care provider for other medical issues, concerns and or health care needs  # Patient was discharged to the [facility] group home with a plan to follow up as noted above.   Nicholes Rough, NP 07/09/2022, 11:29 AM
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
## 2022-07-09 NOTE — Progress Notes (Signed)
   07/09/22 1000  Psych Admission Type (Psych Patients Only)  Admission Status Voluntary  Psychosocial Assessment  Patient Complaints None  Eye Contact Fair  Facial Expression Flat  Affect Flat  Speech Logical/coherent  Interaction Assertive  Motor Activity Other (Comment) (level 3 observation)  Appearance/Hygiene Poor hygiene  Behavior Characteristics Cooperative;Guarded  Mood Euthymic  Aggressive Behavior  Effect No apparent injury  Thought Process  Coherency WDL  Content WDL  Delusions None reported or observed  Perception WDL  Hallucination None reported or observed  Judgment WDL  Confusion WDL  Danger to Self  Current suicidal ideation? Denies  Agreement Not to Harm Self Yes  Description of Agreement verbal contract  Danger to Others  Danger to Others None reported or observed

## 2022-07-09 NOTE — Progress Notes (Signed)
Pt discharged to lobby with taxi voucher. Pt was stable and appreciative at that time. All papers, samples, and prescriptions were given and valuables returned. Verbal understanding expressed. Denies SI/HI and A/VH. Pt given opportunity to express concerns and ask questions. 

## 2022-07-09 NOTE — Discharge Instructions (Signed)
-  Follow-up with your outpatient psychiatric provider -instructions on appointment date, time, and address (location) are provided to you in discharge paperwork.  -Take your psychiatric medications as prescribed at discharge - instructions are provided to you in the discharge paperwork  -Follow-up with outpatient primary care doctor and other specialists -for management of preventative medicine and any chronic medical disease.  -Recommend abstinence from alcohol, tobacco, and other illicit drug use at discharge.   -If your psychiatric symptoms recur, worsen, or if you have side effects to your psychiatric medications, call your outpatient psychiatric provider, 911, [phone] or go to the nearest emergency department.  -If suicidal thoughts occur, call your outpatient psychiatric provider, 911, [phone] or go to the nearest emergency department.  

## 2022-07-09 NOTE — Progress Notes (Signed)
  [facility] Adult Case Management Discharge Plan :  Will you be returning to the same living situation after discharge:  No. Patient will be going to [facility] in [place], [place] At discharge, do you have transportation home?: Yes,  hospital will provide transportation through Taxi to group home Do you have the ability to pay for your medications: Yes,  [employer]  Release of information consent forms completed and in the chart;  Patient's signature needed at discharge.  Patient to Follow up at:  Follow-up Information     [facility] Schedule an appointment as soon as possible for a visit.   Why: Please call to schedule a hospital follow up for primary care services as soon as possible. Contact information: [address] [place] [address] [phone]         [facility] Follow up.   Why: You have been connected with a [employer].  You have an assessment appointment on 07/14/2022 at 12pm.  Please go in person to this provider to get connected with services. Contact information: [place] [address] [phone]                 Next level of care provider has access to [facility] and Suicide Prevention discussed: Yes,  mother, Xzavion Doswell     Has patient been referred to the Quitline?: Patient refused referral  Patient has been referred for addiction treatment: Yes, [facility] team has substance use support  Ida, LCSW 07/09/2022, 9:40 AM

## 2022-07-17 ENCOUNTER — Other Ambulatory Visit: Payer: Self-pay | Admitting: Psychiatry

## 2022-07-17 DIAGNOSIS — F250 Schizoaffective disorder, bipolar type: Secondary | ICD-10-CM

## 2022-07-17 MED ORDER — DIVALPROEX SODIUM ER 500 MG PO TB24: 1000.0000 mg | ORAL_TABLET | Freq: Every day | ORAL | 0 refills | Status: DC

## 2022-07-17 MED ORDER — HALOPERIDOL 5 MG PO TABS: 15.0000 mg | ORAL_TABLET | Freq: Every day | ORAL | 0 refills | Status: DC

## 2022-07-17 MED ORDER — ATORVASTATIN CALCIUM 20 MG PO TABS: 20.0000 mg | ORAL_TABLET | Freq: Every day | ORAL | 0 refills | Status: DC

## 2022-07-17 MED ORDER — MONTELUKAST SODIUM 10 MG PO TABS: 10.0000 mg | ORAL_TABLET | Freq: Every day | ORAL | 0 refills | Status: DC

## 2022-07-17 MED ORDER — BENZTROPINE MESYLATE 0.5 MG PO TABS: 0.5000 mg | ORAL_TABLET | Freq: Two times a day (BID) | ORAL | 0 refills | Status: AC

## 2022-07-17 MED ORDER — AMLODIPINE BESYLATE 10 MG PO TABS: 10.0000 mg | ORAL_TABLET | Freq: Every day | ORAL | 0 refills | Status: AC

## 2022-07-17 MED ORDER — HALOPERIDOL 10 MG PO TABS: 10.0000 mg | ORAL_TABLET | Freq: Every day | ORAL | 0 refills | Status: AC

## 2022-07-17 MED ORDER — PROPRANOLOL HCL ER 120 MG PO CP24: 120.0000 mg | ORAL_CAPSULE | Freq: Every day | ORAL | 0 refills | Status: DC

## 2022-07-17 MED ORDER — HYDROCHLOROTHIAZIDE 12.5 MG PO TABS: 12.5000 mg | ORAL_TABLET | Freq: Every day | ORAL | 0 refills | Status: DC

## 2022-08-10 ENCOUNTER — Ambulatory Visit: Payer: Medicaid Other | Admitting: Dermatology

## 2022-08-26 ENCOUNTER — Ambulatory Visit (HOSPITAL_COMMUNITY)
Admission: AD | Admit: 2022-08-26 | Discharge: 2022-08-26 | Disposition: A | Discharge status: Home / Self Care | Payer: No Typology Code available for payment source | Attending: Psychiatry | Admitting: Psychiatry

## 2022-08-26 NOTE — H&P (Signed)
Behavioral Health Medical Screening Exam  Gregory Saunders is a 26 y.o. male with psychiatric history of Schizoaffective disorder bipolar type, Cannabis use disorder, and tobacco use disorder, who presented voluntarily as a walk-in to [facility] for medication management.   Patient was seen face to face by this provider and chart reviewed.  On evaluation patient is alert and oriented x3, speech is clear and coherent. Patient's eye contact is good, mood is irritable, affect is blunt.  Patient's thought process is goal directed and thought content is within normal limits. Patient denies SI HI, denies AVH, or paranoia. There is no indication that the patient is responding to internal stimuli and no delusion noted.    Per chart review, Patient was admitted here at [facility] between 06/13/22 - 07/09/22 under IVC for acute psychosis, medication adherence and THC use.  During this evaluation, patient reported that he wants to get back on his medication and would like something to help him sleep.  Patient said that he lives in the hotel by himself in [place] and he has been there for 2 weeks now. Patient reported that he is on disability.  Patient said he sees [facility] psychiatrist in [place], who prescribes his medication along with therapy services.  Patient reported that he takes Zyprexa, risperidone, fish oil, but unable to tell the dosage of his medication. Patient reported smoking weed, CBD vapes and 6 packs of alcohol daily. Patient said he eats 1-2 meals a day. Patient denies history of suicide attempt. Patient denies access to gun or fire weapon. Pt appear guarded and refused to provide additional information.    Total Time spent with patient: 20 minutes  Psychiatric Specialty Exam:  Presentation  General Appearance:  Casual  Eye Contact: Good  Speech: Clear and Coherent  Speech Volume: Normal  Handedness: Right   Mood and Affect  Mood: Irritable  Affect: Blunt   Thought Process   Thought Processes: Goal Directed  Descriptions of Associations:Intact  Orientation:Full (Time, Place and Person)  Thought Content:WDL  History of Schizophrenia/Schizoaffective disorder:Yes  Duration of Psychotic Symptoms:Greater than six months  Hallucinations:Hallucinations: None  Ideas of Reference:None  Suicidal Thoughts:Suicidal Thoughts: No  Homicidal Thoughts:Homicidal Thoughts: No   Sensorium  Memory: Immediate Good; Remote Good  Judgment: Good  Insight: Fair; Good   Executive Functions  Concentration: Good  Attention Span: Good  Recall: Good  Fund of Knowledge: Good  Language: Good   Psychomotor Activity  Psychomotor Activity: Psychomotor Activity: Normal   Assets  Assets: Communication Skills; Housing; Desire for Improvement; Physical Health; Resilience   Sleep  Sleep: Sleep: Fair    Physical Exam: Physical Exam Constitutional:      General: He is not in acute distress.    Appearance: He is not diaphoretic.  HENT:     Head: Normocephalic.     Right Ear: External ear normal.     Left Ear: External ear normal.     Nose: No congestion.  Pulmonary:     Effort: Pulmonary effort is normal. No respiratory distress.  Chest:     Chest wall: No tenderness.  Neurological:     Mental Status: He is alert and oriented to person, place, and time.  Psychiatric:        Attention and Perception: Attention and perception normal.        Mood and Affect: Affect is blunt.        Speech: Speech normal.        Behavior: Behavior normal.  Thought Content: Thought content is not paranoid or delusional. Thought content does not include homicidal or suicidal ideation. Thought content does not include homicidal or suicidal plan.        Cognition and Memory: Cognition normal.    Review of Systems  Constitutional:  Negative for chills, diaphoresis and fever.  HENT:  Negative for congestion.   Eyes:  Negative for discharge.  Respiratory:   Negative for cough, shortness of breath and wheezing.   Cardiovascular:  Negative for chest pain and palpitations.  Gastrointestinal:  Negative for diarrhea, nausea and vomiting.  Neurological:  Negative for dizziness, tremors, seizures and weakness.  Psychiatric/Behavioral:  Positive for substance abuse. Negative for depression, hallucinations and suicidal ideas. The patient is not nervous/anxious.     Musculoskeletal: Strength & Muscle Tone: within normal limits Gait & Station: normal Patient leans: N/A  [place] Scale:  Flowsheet Row OP Visit from 08/26/2022 in [place] Admission (Discharged) from 06/13/2022 in [place] [facility] ED from 06/08/2022 in [place] No Risk No Risk No Risk       Recommendations:  Based on my evaluation the patient does not appear to have an emergency medical condition.  Recommends discharge to home/self and follow up with outpatient psychiatric services for medication management and therapy. Patient provided resources for [facility] outpatient psychiatric walk-in clinic and encourage to follow up.  Patient does not meet inpatient psychiatric admission criteria or IVC criteria at this time. There is no evidence of imminent risk of harm to self or others.    Discussed methods to reduce the risk of self-injury or suicide attempts: Frequent conversations regarding unsafe thoughts. Remove all significant sharps. Remove all firearms. Remove all medications, including over-the-counter meds. Consider lockbox for medications and having a responsible person dispense medications until patient has strengthened coping skills. Room checks for sharps or other harmful objects. Secure all chemical substances that can be ingested or inhaled.   Please refrain from using alcohol or illicit substances, as they can affect your mood and can cause depression, anxiety or other  concerning symptoms.  Alcohol can increase the chance that a person will make reckless decisions, like attempting suicide, and can increase the lethality of a drug overdose.   Discussed crisis plan, calling 911, or going to the ED if condition changes or worsens.  Patient verbalized his understanding.   Patient discharged and condition at discharge is stable.   Earney Mallet, NP 08/26/2022, 8:44 PM

## 2022-09-01 ENCOUNTER — Emergency Department (HOSPITAL_COMMUNITY)
Admission: EM | Admit: 2022-09-01 | Discharge: 2022-09-03 | Disposition: A | Discharge status: Home / Self Care | Payer: No Typology Code available for payment source | Attending: Emergency Medicine | Admitting: Emergency Medicine

## 2022-09-01 ENCOUNTER — Encounter (HOSPITAL_COMMUNITY): Payer: Self-pay | Admitting: Emergency Medicine

## 2022-09-01 DIAGNOSIS — F209 Schizophrenia, unspecified: Secondary | ICD-10-CM

## 2022-09-01 DIAGNOSIS — Z1152 Encounter for screening for COVID-19: Secondary | ICD-10-CM | POA: Diagnosis not present

## 2022-09-01 DIAGNOSIS — F250 Schizoaffective disorder, bipolar type: Secondary | ICD-10-CM

## 2022-09-01 DIAGNOSIS — Z91010 Allergy to peanuts: Secondary | ICD-10-CM | POA: Diagnosis not present

## 2022-09-01 DIAGNOSIS — R451 Restlessness and agitation: Secondary | ICD-10-CM | POA: Diagnosis not present

## 2022-09-01 DIAGNOSIS — F17200 Nicotine dependence, unspecified, uncomplicated: Secondary | ICD-10-CM | POA: Insufficient documentation

## 2022-09-01 DIAGNOSIS — Z91148 Patient's other noncompliance with medication regimen for other reason: Secondary | ICD-10-CM

## 2022-09-01 DIAGNOSIS — F23 Brief psychotic disorder: Secondary | ICD-10-CM

## 2022-09-01 DIAGNOSIS — R030 Elevated blood-pressure reading, without diagnosis of hypertension: Secondary | ICD-10-CM

## 2022-09-01 LAB — URINALYSIS, ROUTINE W REFLEX MICROSCOPIC
Bacteria, UA: NONE SEEN
Bilirubin Urine: NEGATIVE
Glucose, UA: NEGATIVE mg/dL
Hgb urine dipstick: NEGATIVE
Ketones, ur: NEGATIVE mg/dL
Leukocytes,Ua: NEGATIVE
Nitrite: NEGATIVE
Protein, ur: 30 mg/dL — AB
Specific Gravity, Urine: 1.025 (ref 1.005–1.030)
pH: 5 (ref 5.0–8.0)

## 2022-09-01 LAB — COMPREHENSIVE METABOLIC PANEL
ALT: 34 U/L (ref 0–44)
AST: 24 U/L (ref 15–41)
Albumin: 3.9 g/dL (ref 3.5–5.0)
Alkaline Phosphatase: 71 U/L (ref 38–126)
Anion gap: 8 (ref 5–15)
BUN: 8 mg/dL (ref 6–20)
CO2: 24 mmol/L (ref 22–32)
Calcium: 9.3 mg/dL (ref 8.9–10.3)
Chloride: 103 mmol/L (ref 98–111)
Creatinine, Ser: 1.33 mg/dL — ABNORMAL HIGH (ref 0.61–1.24)
GFR, Estimated: >60 mL/min (ref >60)
Glucose, Bld: 109 mg/dL — ABNORMAL HIGH (ref 70–99)
Potassium: 3.4 mmol/L — ABNORMAL LOW (ref 3.5–5.1)
Sodium: 135 mmol/L (ref 135–145)
Total Bilirubin: 1.4 mg/dL — ABNORMAL HIGH (ref 0.3–1.2)
Total Protein: 7.6 g/dL (ref 6.5–8.1)

## 2022-09-01 LAB — CBC WITH DIFFERENTIAL/PLATELET
Abs Immature Granulocytes: 0.02 10*3/uL (ref 0.00–0.07)
Basophils Absolute: 0 10*3/uL (ref 0.0–0.1)
Basophils Relative: 1 %
Eosinophils Absolute: 0.1 10*3/uL (ref 0.0–0.5)
Eosinophils Relative: 2 %
HCT: 40.5 % (ref 39.0–52.0)
Hemoglobin: 13.5 g/dL (ref 13.0–17.0)
Immature Granulocytes: 0 %
Lymphocytes Relative: 29 %
Lymphs Abs: 1.4 10*3/uL (ref 0.7–4.0)
MCH: 28.2 pg (ref 26.0–34.0)
MCHC: 33.3 g/dL (ref 30.0–36.0)
MCV: 84.6 fL (ref 80.0–100.0)
Monocytes Absolute: 0.4 10*3/uL (ref 0.1–1.0)
Monocytes Relative: 8 %
Neutro Abs: 2.8 10*3/uL (ref 1.7–7.7)
Neutrophils Relative %: 60 %
Platelets: 244 10*3/uL (ref 150–400)
RBC: 4.79 MIL/uL (ref 4.22–5.81)
RDW: 15.1 % (ref 11.5–15.5)
WBC: 4.8 10*3/uL (ref 4.0–10.5)
nRBC: 0 % (ref 0.0–0.2)

## 2022-09-01 LAB — RAPID URINE DRUG SCREEN, HOSP PERFORMED
Amphetamines: NOT DETECTED
Barbiturates: NOT DETECTED
Benzodiazepines: NOT DETECTED
Cocaine: NOT DETECTED
Opiates: NOT DETECTED
Tetrahydrocannabinol: POSITIVE — AB

## 2022-09-01 LAB — SALICYLATE LEVEL: Salicylate Lvl: <7 mg/dL — ABNORMAL LOW (ref 7.0–30.0)

## 2022-09-01 LAB — AMMONIA: Ammonia: 16 umol/L (ref 9–35)

## 2022-09-01 LAB — VALPROIC ACID LEVEL: Valproic Acid Lvl: <10 ug/mL — ABNORMAL LOW (ref 50.0–100.0)

## 2022-09-01 LAB — ETHANOL: Alcohol, Ethyl (B): <10 mg/dL (ref <10)

## 2022-09-01 LAB — ACETAMINOPHEN LEVEL: Acetaminophen (Tylenol), Serum: <10 ug/mL — ABNORMAL LOW (ref 10–30)

## 2022-09-01 MED ORDER — BENZTROPINE MESYLATE 1 MG PO TABS
0.5000 mg | ORAL_TABLET | Freq: Two times a day (BID) | ORAL | Status: DC
  Administered 2022-09-01 – 2022-09-03 (×4): 0.5 mg via ORAL
  Filled 2022-09-01 (×4): qty 1

## 2022-09-01 MED ORDER — PROPRANOLOL HCL ER 60 MG PO CP24
120.0000 mg | ORAL_CAPSULE | Freq: Every day | ORAL | Status: DC
  Administered 2022-09-01: 120 mg via ORAL
  Filled 2022-09-01: qty 2
  Filled 2022-09-01: qty 1
  Filled 2022-09-01 (×2): qty 2

## 2022-09-01 MED ORDER — AMLODIPINE BESYLATE 5 MG PO TABS
10.0000 mg | ORAL_TABLET | Freq: Every day | ORAL | Status: DC
  Administered 2022-09-01 – 2022-09-03 (×3): 10 mg via ORAL
  Filled 2022-09-01 (×3): qty 2

## 2022-09-01 MED ORDER — HYDROCHLOROTHIAZIDE 12.5 MG PO TABS
12.5000 mg | ORAL_TABLET | Freq: Every day | ORAL | Status: DC
  Administered 2022-09-01 – 2022-09-03 (×3): 12.5 mg via ORAL
  Filled 2022-09-01 (×3): qty 1

## 2022-09-01 MED ORDER — DIVALPROEX SODIUM ER 500 MG PO TB24
1000.0000 mg | ORAL_TABLET | Freq: Every day | ORAL | Status: DC
  Administered 2022-09-01 – 2022-09-02 (×2): 1000 mg via ORAL
  Filled 2022-09-01 (×2): qty 2

## 2022-09-01 MED ORDER — ATORVASTATIN CALCIUM 10 MG PO TABS
20.0000 mg | ORAL_TABLET | Freq: Every day | ORAL | Status: DC
  Administered 2022-09-01 – 2022-09-02 (×2): 20 mg via ORAL
  Filled 2022-09-01: qty 2

## 2022-09-01 MED ORDER — MONTELUKAST SODIUM 10 MG PO TABS
10.0000 mg | ORAL_TABLET | Freq: Every day | ORAL | Status: DC
  Administered 2022-09-01 – 2022-09-02 (×2): 10 mg via ORAL
  Filled 2022-09-01 (×2): qty 1

## 2022-09-01 MED ORDER — LORAZEPAM 1 MG PO TABS
1.0000 mg | ORAL_TABLET | Freq: Once | ORAL | Status: AC
  Administered 2022-09-01: 1 mg via ORAL
  Filled 2022-09-01: qty 1

## 2022-09-01 MED ORDER — HALOPERIDOL 5 MG PO TABS
15.0000 mg | ORAL_TABLET | Freq: Every day | ORAL | Status: DC
  Administered 2022-09-01 – 2022-09-02 (×2): 15 mg via ORAL
  Filled 2022-09-01 (×2): qty 3

## 2022-09-01 MED ORDER — HALOPERIDOL 5 MG PO TABS: 10.0000 mg | ORAL_TABLET | Freq: Every day | ORAL | Status: DC

## 2022-09-01 NOTE — ED Notes (Signed)
IVC paperwork is in the orange zone on purple clipboard

## 2022-09-01 NOTE — ED Provider Notes (Signed)
[facility] EMERGENCY DEPARTMENT Provider Note   CSN: [id number] Arrival date & time: 09/01/22  1453     History  No chief complaint on file.   Gregory Saunders is a 26 y.o. male.  Level 5 caveat for psychiatric illness. Patient has schizophrenia and has not been taking his medications for several months. He states he was watching TV and forced to come to hospital by police. Oriented to person and place and time. Admits to tobacco use. No other drug use. Denies suicidal or homicidal thoughts. States he "sees the devil and witches" and is hearing voices.  IVC by mother who is concerned about his behavior. Reported not concerned if he lives or dies.  Denies any physical complaints.   The history is provided by the patient and the police.       Home Medications Prior to Admission medications   Medication Sig Start Date End Date Taking? Authorizing Provider  amLODipine (NORVASC) 10 MG tablet Take 1 tablet (10 mg total) by mouth daily. 07/17/22 08/16/22  Massengill, Harrold Donath, MD  atorvastatin (LIPITOR) 20 MG tablet Take 1 tablet (20 mg total) by mouth at bedtime. 07/17/22 08/16/22  Massengill, Harrold Donath, MD  benztropine (COGENTIN) 0.5 MG tablet Take 1 tablet (0.5 mg total) by mouth 2 (two) times daily. 07/17/22 08/16/22  Massengill, Harrold Donath, MD  divalproex (DEPAKOTE ER) 500 MG 24 hr tablet Take 2 tablets (1,000 mg total) by mouth at bedtime. 07/17/22 08/16/22  Massengill, Harrold Donath, MD  haloperidol (HALDOL) 10 MG tablet Take 1 tablet (10 mg total) by mouth daily at 12 noon. 07/17/22 08/16/22  Massengill, Harrold Donath, MD  haloperidol (HALDOL) 5 MG tablet Take 3 tablets (15 mg total) by mouth at bedtime. 07/17/22 08/16/22  Massengill, Harrold Donath, MD  hydrochlorothiazide (HYDRODIURIL) 12.5 MG tablet Take 1 tablet (12.5 mg total) by mouth daily. 07/17/22 08/16/22  Massengill, Harrold Donath, MD  montelukast (SINGULAIR) 10 MG tablet Take 1 tablet (10 mg total) by mouth at bedtime. 07/17/22 08/16/22   Massengill, Harrold Donath, MD  propranolol ER (INDERAL LA) 120 MG 24 hr capsule Take 1 capsule (120 mg total) by mouth at bedtime. 07/17/22 08/16/22  Massengill, Harrold Donath, MD      Allergies    Amoxicillin, Peanut (diagnostic), and Penicillins    Review of Systems   Review of Systems  Unable to perform ROS: Psychiatric disorder    Physical Exam Updated Vital Signs BP (!) 153/102 (BP Location: Right Arm)   Pulse 87   Temp 98.3 F (36.8 C)   Resp 20   SpO2 100%  Physical Exam Vitals and nursing note reviewed.  Constitutional:      General: He is not in acute distress.    Appearance: He is well-developed.     Comments: agitated  HENT:     Head: Normocephalic and atraumatic.     Mouth/Throat:     Pharynx: No oropharyngeal exudate.  Eyes:     Conjunctiva/sclera: Conjunctivae normal.     Pupils: Pupils are equal, round, and reactive to light.  Neck:     Comments: No meningismus. Cardiovascular:     Rate and Rhythm: Normal rate and regular rhythm.     Heart sounds: Normal heart sounds. No murmur heard. Pulmonary:     Effort: Pulmonary effort is normal. No respiratory distress.     Breath sounds: Normal breath sounds.  Abdominal:     Palpations: Abdomen is soft.     Tenderness: There is no abdominal tenderness. There is no guarding or rebound.  Musculoskeletal:        General: No tenderness. Normal range of motion.     Cervical back: Normal range of motion and neck supple.  Skin:    General: Skin is warm.  Neurological:     Mental Status: He is alert and oriented to person, place, and time.     Cranial Nerves: No cranial nerve deficit.     Motor: No abnormal muscle tone.     Coordination: Coordination normal.     Comments: No ataxia on finger to nose bilaterally. No pronator drift. 5/5 strength throughout. CN 2-12 intact.Equal grip strength. Sensation intact.   Psychiatric:        Behavior: Behavior normal.     Comments: Aggressive and agitated.  Oriented to person, place,  time. Year 2023     ED Results / Procedures / Treatments   Labs (all labs ordered are listed, but only abnormal results are displayed) Labs Reviewed  COMPREHENSIVE METABOLIC PANEL - Abnormal; Notable for the following components:      Result Value   Potassium 3.4 (*)    Glucose, Bld 109 (*)    Creatinine, Ser 1.33 (*)    Total Bilirubin 1.4 (*)    All other components within normal limits  ACETAMINOPHEN LEVEL - Abnormal; Notable for the following components:   Acetaminophen (Tylenol), Serum <10 (*)    All other components within normal limits  SALICYLATE LEVEL - Abnormal; Notable for the following components:   Salicylate Lvl <7.0 (*)    All other components within normal limits  RAPID URINE DRUG SCREEN, HOSP PERFORMED - Abnormal; Notable for the following components:   Tetrahydrocannabinol POSITIVE (*)    All other components within normal limits  URINALYSIS, ROUTINE W REFLEX MICROSCOPIC - Abnormal; Notable for the following components:   APPearance HAZY (*)    Protein, ur 30 (*)    All other components within normal limits  VALPROIC ACID LEVEL - Abnormal; Notable for the following components:   Valproic Acid Lvl <10 (*)    All other components within normal limits  CBC WITH DIFFERENTIAL/PLATELET  ETHANOL  AMMONIA    EKG None  Radiology No results found.  Procedures Procedures    Medications Ordered in ED Medications - No data to display  ED Course/ Medical Decision Making/ A&P                           Medical Decision Making Amount and/or Complexity of Data Reviewed Labs: ordered. Decision-making details documented in ED Course. Radiology: ordered and independent interpretation performed. Decision-making details documented in ED Course. ECG/medicine tests: ordered and independent interpretation performed. Decision-making details documented in ED Course.  Risk Prescription drug management.   Schizophrenic hearing voices and not taking medications.  Denies SI or HI. Vitals stable. No stable no distress.   Screening labs to be obtained IVC by police  Screening labs are reassuring.  Toxicology labs are normal.  Valproic acid level undetectable.  Ammonia levels normal.  IVC paperwork completed and first exam completed on arrival.  Patient calm and cooperative.  Home medications ordered.  Patient medically clear for TTS evaluation.        Final Clinical Impression(s) / ED Diagnoses Final diagnoses:  None    Rx / DC Orders ED Discharge Orders     None         Treyshaun Keatts, Jeannett Senior, MD 09/01/22 2013

## 2022-09-01 NOTE — ED Notes (Signed)
IVC Completed 4 copies made, 1 original in triage red folder, 1 med rec drawer and labeled, 3 copies on purple clip board in green

## 2022-09-01 NOTE — ED Notes (Signed)
The patient belongings are in locker 8

## 2022-09-01 NOTE — ED Triage Notes (Signed)
Pt brought by police, IVC'd due to not taking medications. Hx of schizophrenia. Pt denies SI

## 2022-09-02 DIAGNOSIS — F250 Schizoaffective disorder, bipolar type: Secondary | ICD-10-CM

## 2022-09-02 DIAGNOSIS — Z91148 Patient's other noncompliance with medication regimen for other reason: Secondary | ICD-10-CM

## 2022-09-02 LAB — RESP PANEL BY RT-PCR (FLU A&B, COVID) ARPGX2
Influenza A by PCR: NEGATIVE
Influenza B by PCR: NEGATIVE
SARS Coronavirus 2 by RT PCR: NEGATIVE

## 2022-09-02 NOTE — BH Assessment (Signed)
Comprehensive Clinical Assessment (CCA) Note  09/02/2022 Gregory Saunders [id number]  Disposition: Gregory [place], NP recommends inpatient treatment. Per [name], RN no available beds. CSW to seek placement. Disposition discussed with Gregory Rung, RN via secure message.   Flowsheet Row ED from 09/01/2022 in [facility] EMERGENCY DEPARTMENT OP Visit from [facility] Admission (Discharged) from [facility]/13/[facility]  C-SSRS RISK CATEGORY No Risk No Risk No Risk      The patient demonstrates the following risk factors for suicide: Chronic risk factors for suicide include: psychiatric disorder of Schizoaffective Disorder, Bipolar type (HCC) . Acute risk factors for suicide include:  Pt denies, SI . Protective factors for this patient include:  Pt denies, SI . Considering these factors, the overall suicide risk at this point appears to be no risk. Patient is not appropriate for outpatient follow up.  Gregory Saunders is a 26 year old male who presents involuntary and unaccompanied to [facility]. Clinician asked the pt, "what brought you to the hospital?" Pt reports, he was sleep in his motel room until the police came and brought him to the hospital. Pt reports, he hears/sees the spirit world, dead people and entities. Pt reports, when he was a baby his semen was taken from him so he's not sure if he has children. 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 Pt reports, last year he sold his soul to the [employer] [place], he believes in [place] and the [place]. Pt reports, he has $75,000  but chooses to live in a motel. Pt reports, he has to find somewhere to go because the police were called out. During the assessment pt told clinician, "your grandma is behind you." Clinician asked "how do you know it's my grandmother?" Pt reports, because she says you're doing good and she loves you." Pt denies, SI, HI, self-injurious  behaviors and access to weapons.   Pt was IVC'd by his mother. Per IVC paperwork: "The respondent has not ben taking his medications as prescribed. The respondent states that he is the devil and that his going to hurt his family. The respondent is very aggressive and states that he doe snot care of he lives or dies. The respondent acted in a way that created a substantial risk of serious bodily harm and there is a reasonable probability that this conduct will be repeated."   Pt reports, vaping CBD everyday, smoking a blunt on a makeshift bong last year. Pt reports, drinking once in a blue moon. Pt reports, smoking a pack of Black and Mild's everyday. Pt's UDS is positive for Marijuana. Pt's BAL is <10 at 1452. Pt reports, previous inpatient admission at [facility]. Per chart, pt has previous inpatient admission at [facility] from 06/13/2022-07/09/2022 for Schizoaffective Disorder, Bipolar type.   Pt presents alert in scrubs. During the assessment at times the pt would stick his tongue out to the side of his mouth. Pt's mood was euthymic. Pt's affect was congruent.   Diagnosis: Schizoaffective Disorder, Bipolar type (HCC).  *Pt denies, having family, friend supports.*   Chief Complaint:  Chief Complaint  Patient presents with   Psychiatric Evaluation   Visit Diagnosis:     CCA Screening, Triage and Referral (STR)  Patient Reported Information How did you hear about [place]? Other (Comment) ([employer].)  What Is the Reason for Your Visit/Call Today? Pt reports, he hears/sees the spirit world, dead, people, and entities. Pt  reports, he was born a Demi God with powers. Pt reports, he sold his soul to the [employer] [place] [employer], he believes in [place] and the [place]. Pt denies, SI, HI, self-injurious and access to weapons.  How Long Has This Been Causing You Problems? > than 6 months  What Do You Feel Would Help You the Most Today? Housing Assistance; Medication(s); Treatment for Depression or other mood  problem   Have You Recently Had Any Thoughts About Hurting Yourself? No  Are You Planning to Commit Suicide/Harm Yourself At This time? No   Flowsheet Row ED from 09/01/2022 in [facility] EMERGENCY DEPARTMENT OP Visit from 08/26/2022 [facility] Admission (Discharged) from 06/13/2022 [facility]  C-SSRS RISK CATEGORY No Risk No Risk No Risk       Have you Recently Had Thoughts About Hurting Someone [name]? No  Are You Planning to Harm Someone at This Time? No  Explanation: NA   Have You Used Any Alcohol or Drugs in the Past 24 Hours? Yes  What Did You Use and How Much? Pt reports, vaping CBD everyday, smoking a blunt on a makeshift bong last year. Pt reports, drinking once in a blue moon. Pt reports, smoking a pack of Black and Milds everyday.   Do You Currently Have a Therapist/Psychiatrist? No  Name of Therapist/Psychiatrist: Name of Therapist/Psychiatrist: Pt denies.   Have You Been Recently Discharged From Any Office Practice or Programs? No  Explanation of Discharge From Practice/Program: NA     CCA Screening Triage Referral Assessment Type of Contact: Tele-Assessment  Telemedicine Service Delivery: Telemedicine service delivery: This service was provided via telemedicine using a 2-way, interactive audio and video technology  Is this Initial or Reassessment? Is this Initial or Reassessment?: Initial Assessment  Date Telepsych consult ordered in CHL:  Date Telepsych consult ordered in CHL: 09/01/22  Time Telepsych consult ordered in CHL:  Time Telepsych consult ordered in CHL: 1646  Location of Assessment: [facility] ED  Provider Location: [facility] Assessment Services   Collateral Involvement: None.   Does Patient Have a Automotive engineer Guardian? No  Legal Guardian Contact Information: NA  Copy of Legal Guardianship Form: -- (NA)  Legal Guardian Notified of Arrival: --  (NA)  Legal Guardian Notified of Pending Discharge: -- (NA)  If Minor and Not Living with Parent(s), Who has Custody? NA  Is CPS involved or ever been involved? Never  Is APS involved or ever been involved? Never   Patient Determined To Be At Risk for Harm To Self or Others Based on Review of Patient Reported Information or Presenting Complaint? No  Method: No Plan  Availability of Means: No access or NA  Intent: Vague intent or NA  Notification Required: No need or identified person  Additional Information for Danger to Others Potential: -- (NA)  Additional Comments for Danger to Others Potential: NA  Are There Guns or Other Weapons in Your Home? No (Pt denies.)  Types of Guns/Weapons: NA  Are These Weapons Safely Secured?                            -- (NA)  Who Could Verify You Are Able To Have These Secured: NA  Do You Have any Outstanding Charges, Pending Court Dates, Parole/Probation? Pt denies.  Contacted To Inform of Risk of Harm To Self or Others: Other: Comment (NA)    Does Patient  Present under Involuntary Commitment? No    [place]   Patient Currently Receiving the Following Services: Not Receiving Services   Determination of Need: Emergent (2 hours)   Options For Referral: [facility]; Medication Management; Facility-Based Crisis     CCA Biopsychosocial Patient Reported Schizophrenia/Schizoaffective Diagnosis in Past: Yes   Strengths: self-awareness   Mental Health Symptoms Depression:   Worthlessness; Hopelessness; Fatigue; Tearfulness; Difficulty Concentrating; Sleep (too much or little); Irritability (Guilt/blame.)   Duration of Depressive symptoms:  Duration of Depressive Symptoms: Greater than two weeks   Mania:   None   Anxiety:    Worrying; Tension   Psychosis:   Hallucinations; Delusions   Duration of Psychotic symptoms:  Duration of Psychotic Symptoms: Greater than  six months   Trauma:   None   Obsessions:   None   Compulsions:   None   Inattention:   None   Hyperactivity/Impulsivity:   None   Oppositional/Defiant Behaviors:   Angry   Emotional Irregularity:   None   Other Mood/Personality Symptoms:   Pt presents with depressive symptoms.    Mental Status Exam Appearance and self-care  Stature:   Tall   Weight:   Average weight   Clothing:   -- (Pt in scrubs.)   Grooming:   Normal   Cosmetic use:   None   Posture/gait:   Normal   Motor activity:   Not Remarkable   Sensorium  Attention:   Normal   Concentration:   Normal   Orientation:   X5   Recall/memory:   Normal   Affect and Mood  Affect:   Anxious   Mood:   Angry; Anxious; Irritable   Relating  Eye contact:   Normal   Facial expression:   Responsive   Attitude toward examiner:   Cooperative   Thought and Language  Speech flow:  Normal   Thought content:   Appropriate to Mood and Circumstances   Preoccupation:   None   Hallucinations:   Auditory; Visual   Organization:   Patent examinerCoherent   Executive Functions  Fund of Knowledge:   Fair   Intelligence:   Average   Abstraction:   Abstract   Judgement:   Poor   Reality Testing:   Distorted   Insight:   Fair   Decision Making:   Impulsive   Social Functioning  Social Maturity:   Impulsive   Social Judgement:   "Street Smart"   Stress  Stressors:   Other (Comment) (Pt reports, he has bad anxiety and depression.)   Coping Ability:   -- (UTA)   Skill Deficits:   Decision making   Supports:   Family     Religion: Religion/Spirituality Are You A Religious Person?: Yes What is Your Religious Affiliation?: Baptist (Pt reports, 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  Exercise/Diet: Exercise/Diet Do You Exercise?: No Have You Gained or Lost A Significant Amount of Weight in the Past Six Months?:  (NA) Do You Follow a Special Diet?:  (NA) Do You Have Any Trouble Sleeping?: Yes Explanation of Sleeping Difficulties: Pt reprots, he sleeps a couple of hours.   CCA Employment/Education Employment/Work Situation: Employment / Work Situation Employment Situation: On disability Why is Patient on Disability: Schizophrenia. How Long has Patient Been on Disability: Since 2018. Patient's Job has Been Impacted by Current  Illness:  (NA) Has Patient ever Been in the Military?: No  Education: Education Is Patient Currently Attending School?: No Last Grade Completed: 12 Did You Attend College?: Yes What Type of College Degree Do you Have?: Pt reports, he completed two years of school but did not graduate. Did You Have An Individualized Education Program (IIEP): No Did You Have Any Difficulty At School?: No Patient's Education Has Been Impacted by Current Illness: No   CCA Family/Childhood History Family and Relationship History: Family history Marital status: Single Does patient have children?:  (Pt reports, when he was a baby semen was taken from him so he's not sure if he has children.)  Childhood History:  Childhood History By whom was/is the patient raised?: Adoptive parents (Per chart.) Did patient suffer any verbal/emotional/physical/sexual abuse as a child?: No Did patient suffer from severe childhood neglect?: No Has patient ever been sexually abused/assaulted/raped as an adolescent or adult?: No Was the patient ever a victim of a crime or a disaster?: No Witnessed domestic violence?: No Has patient been affected by domestic violence as an adult?:  (NA)       CCA Substance Use Alcohol/Drug Use: Alcohol / Drug Use Pain Medications: See MAR Prescriptions: See MAR Over the Counter: See MAR History of alcohol / drug use?: No  history of alcohol / drug abuse Longest period of sobriety (when/how long): NA Negative Consequences of Use:  (NA) Withdrawal Symptoms: None (NA)    ASAM's:  Six Dimensions of Multidimensional Assessment  Dimension 1:  Acute Intoxication and/or Withdrawal Potential:   Dimension 1:  Description of individual's past and current experiences of substance use and withdrawal: NA  Dimension 2:  Biomedical Conditions and Complications:   Dimension 2:  Description of patient's biomedical conditions and  complications: NA  Dimension 3:  Emotional, Behavioral, or Cognitive Conditions and Complications:  Dimension 3:  Description of emotional, behavioral, or cognitive conditions and complications: NA  Dimension 4:  Readiness to Change:  Dimension 4:  Description of Readiness to Change criteria: NA  Dimension 5:  Relapse, Continued use, or Continued Problem Potential:  Dimension 5:  Relapse, continued use, or continued problem potential critiera description: NA  Dimension 6:  Recovery/Living Environment:  Dimension 6:  Recovery/Iiving environment criteria description: NA  ASAM Severity Score:    ASAM Recommended Level of Treatment: ASAM Recommended Level of Treatment:  (NA)   Substance use Disorder (SUD) Substance Use Disorder (SUD)  Checklist Symptoms of Substance Use:  (NA)  Recommendations for Services/Supports/Treatments: Recommendations for Services/Supports/Treatments Recommendations For Services/Supports/Treatments: Inpatient Hospitalization  Discharge Disposition: Discharge Disposition Medical Exam completed: Yes  DSM5 Diagnoses: Patient Active Problem List   Diagnosis Date Noted   Essential hypertension 06/30/2022   Hyperlipidemia 06/30/2022   Hypothyroidism 06/14/2022   Prediabetes 06/14/2022   Tobacco use disorder 06/14/2022   OSA (obstructive sleep apnea) 01/12/2022   Schizoaffective disorder, bipolar type (HCC) 10/16/2020   Cannabis use disorder 10/16/2020     Referrals  to Alternative Service(s): Referred to Alternative Service(s):   Place:   Date:   Time:    Referred to Alternative Service(s):   Place:   Date:   Time:    Referred to Alternative Service(s):   Place:   Date:   Time:    Referred to Alternative Service(s):   Place:   Date:   Time:     Redmond Pulling, [facility] Comprehensive Clinical Assessment (CCA) Screening, Triage and Referral Note  09/02/2022 Gregory Saunders [id number]  Chief Complaint:  Chief Complaint  Patient presents with   Psychiatric Evaluation   Visit Diagnosis:   Patient Reported Information How did you hear about [place]? Other (Comment) ([employer].)  What Is the Reason for Your Visit/Call Today? Pt reports, he hears/sees the spirit world, dead, people, and entities. Pt reports, he was born a Demi God with powers. Pt reports, he sold his soul to the [employer] [place], he believes in [place] and the [place]. Pt denies, SI, HI, self-injurious and access to weapons.  How Long Has This Been Causing You Problems? > than 6 months  What Do You Feel Would Help You the Most Today? Housing Assistance; Medication(s); Treatment for Depression or other mood problem   Have You Recently Had Any Thoughts About Hurting Yourself? No  Are You Planning to Commit Suicide/Harm Yourself At This time? No   Have you Recently Had Thoughts About Hurting Someone [name]? No  Are You Planning to Harm Someone at This Time? No  Explanation: NA   Have You Used Any Alcohol or Drugs in the Past 24 Hours? Yes  How Long Ago Did You Use Drugs or Alcohol? No data recorded What Did You Use and How Much? Pt reports, vaping CBD everyday, smoking a blunt on a makeshift bong last year. Pt reports, drinking once in a blue moon. Pt reports, smoking a pack of Black and Milds everyday.   Do You Currently Have a Therapist/Psychiatrist? No  Name of Therapist/Psychiatrist: Pt denies.   Have You Been Recently Discharged From Any Office Practice or Programs? No  Explanation  of Discharge From Practice/Program: NA    CCA Screening Triage Referral Assessment Type of Contact: Tele-Assessment  Telemedicine Service Delivery: Telemedicine service delivery: This service was provided via telemedicine using a 2-way, interactive audio and video technology  Is this Initial or Reassessment? Is this Initial or Reassessment?: Initial Assessment  Date Telepsych consult ordered in CHL:  Date Telepsych consult ordered in CHL: 09/01/22  Time Telepsych consult ordered in CHL:  Time Telepsych consult ordered in CHL: 1646  Location of Assessment: [facility] ED  Provider Location: [facility] Assessment Services    Collateral Involvement: None.   Does Patient Have a Automotive engineer Guardian? No data recorded Name and Contact of Legal Guardian: No data recorded If Minor and Not Living with Parent(s), Who has Custody? NA  Is CPS involved or ever been involved? Never  Is APS involved or ever been involved? Never   Patient Determined To Be At Risk for Harm To Self or Others Based on Review of Patient Reported Information or Presenting Complaint? No  Method: No Plan  Availability of Means: No access or NA  Intent: Vague intent or NA  Notification Required: No need or identified person  Additional Information for Danger to Others Potential: -- (NA)  Additional Comments for Danger to Others Potential: NA  Are There Guns or Other Weapons in Your Home? No (Pt denies.)  Types of Guns/Weapons: NA  Are These Weapons Safely Secured?                            -- (NA)  Who Could Verify You Are Able To Have These Secured: NA  Do You Have any Outstanding Charges, Pending Court Dates, Parole/Probation? Pt denies.  Contacted To Inform of Risk of Harm To Self or Others: Other: Comment (NA)   Does Patient Present under Involuntary Commitment? No    [place] of Residence: [place]  Patient Currently Receiving the Following Services: Not Receiving  Services   Determination of Need: Emergent (2 hours)   Options For Referral: Inpatient Hospitalization; [facility] Urgent Care; Medication Management; Facility-Based Crisis   Discharge Disposition:  Discharge Disposition Medical Exam completed: Yes  Redmond Pulling, [facility]       Redmond Pulling, MS, [facility], [facility] Triage Specialist [phone]

## 2022-09-02 NOTE — Progress Notes (Signed)
Patient has been denied by [facility] due to no appropriate beds available. Patient meets BH inpatient criteria per Joaquin Courts, FNP. Patient has been faxed out to the following facilities:    [facility]  [address] [place] [address] [phone]  [facility]  [address] [place], [place] [address] [phone]  [facility]  [address] [place] [address] [phone]  [facility]  [address], [place] [address] [phone]  [facility]  [address] [place], [place] [address] [phone]  [facility]  [address] [place] [address] [phone]  [facility]  [address] [place] [address] [phone]  [facility]  [address] [place] [address] [phone]  [facility]  [address] [place] [address] [phone]  [facility]  [address] [place] [address] [phone]  [facility]  [address] [place] [address] [phone]  [facility]  [address] [place] [address] [phone]  [facility]  [address] [place] [place] [address] [phone]  [facility]  [address], [place] [address] [phone]    Damita Dunnings, MSW, LCSW-A  8:22 PM 09/02/2022

## 2022-09-02 NOTE — BH Assessment (Signed)
Clinician messaged Monique Davis, RN: "Hey. It's Trey with TTS. Is the pt able to engage in the assessment, if so the pt will need to be placed in a private room. Is the pt under IVC? Also is the pt medically cleared?"    Clinician awaiting response.    Daison Braxton D Jennyfer Nickolson, MS, LCMHC, CRC Triage Specialist [phone]  

## 2022-09-02 NOTE — Progress Notes (Signed)
[facility] Psych ED Progress Note  09/02/2022 11:10 AM Gregory Saunders  MRN:  [id number]   Subjective:  "I'm under IVC" Principal Problem: Schizoaffective disorder, bipolar type ([place]) Diagnosis:  Principal Problem:   Schizoaffective disorder, bipolar type ([place]) Active Problems:   Non compliance w medication regimen   ED Assessment Time Calculation: Start Time: 1100 Stop Time: 1115 Total Time in Minutes (Assessment Completion): [place], 26 year old, male, re-evaluated face to face at [facility], per TTS consult, for psychiatric evaluation. Patient is currently under IVC petition, petitioned by mother, Desmen Goetter, [phone].  IVC petition reads as follows" The respondent has not ben taking his medications as prescribed. The respondent states that he is the devil and that his going to hurt his family. The respondent is very aggressive and states that he doe snot care of he lives or dies. The respondent acted in a way that created a substantial risk of serious bodily harm and there is a reasonable probability that this conduct will be repeated."    On evaluation, patient is laying in bed and reports to this writer he is here due to IVC his mother "ordered". He denies knowledge or understanding as to the reason his mother initiated IVC petition. When asked about his last mental health admission, patient states, " they took me off my medicine last time". He further reports, "I do not have medicine take". Patient is very irritable and labile while speaking with this Probation officer. When asked questions about suicidal and homicidal thoughts, patient responded, "no". When asked is he was hearing voices, patient became very defensive and irritable while stating, "I'm gonna repeat the same thing I tell them, no, no". Patient refused to advised who he was referring to when stating "them".  He notably began to get slightly agitated however was not verbally or physically aggressive.  This Probation officer did  inform patient that he is currently pending inpatient placement.  Patient acknowledged this writer's statement by responding, "okay".   During evaluation Gregory Saunders is laying in bed, in no acute distress. He is s alert, oriented x 4, irritable, cooperative. His mood is labile, with a blunted affect. His speech is normal. Although irritable, patient has non-aggressive behavior.  Objectively, patient is very guarded and limiting his responses during evaluation which is concerning for paranoia. He do not appear to be responding to internal stimuli. Patient was verbalizing delusion thoughts/thinking patterns earlier this morning (see TTS encounter note).  Patient is able to converse coherently, although remains guarded with certain responses. He is also denies suicidal/self-harm/homicidal ideation, however, objectively, patient is unable to reliably contract for safety, and meets criteria for inpatient psychiatric treatment for acute mental health crisis stabilization, medication management and safety.    UDS positive THC and COVID/Flu negative, currently awaiting inpatient placement.  TTS Evaluation  09/02/2022 @0400  am encounter notes: Gregory Saunders is a 26 year old male who presents involuntary and unaccompanied to [facility] . Clinician asked the pt, "what brought you to the hospital?" Pt reports, he was sleep in his motel room until the police came and brought him to the hospital. Pt reports, he hears/sees the spirit world, dead people and entities. Pt reports, when he was a baby his semen was taken from him so he's not sure if he has children. 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 Pt reports, last year he sold his soul to the [place], he believes in [place] and the  Devil. Pt reports, he has $75,000  but chooses to live in a motel. Pt reports, he has to find somewhere to go because the police were called out. During the assessment pt told clinician, "your grandma is behind you." Clinician  asked "how do you know it's my grandmother?" Pt reports, because she says you're doing good and she loves you." Pt denies, SI, HI, self-injurious behaviors and access to weapons.     Past Psychiatric History:  Schizoaffective Disorder, Cannabis disorder  [facility] admission 06/13/2022-07/09/2022 for schizoaffective disorder   [place] Scale:  [facility] ED from 09/01/2022 in [place] OP Visit from 08/26/2022 in [facility] Admission (Discharged) from 06/13/2022 in [facility] [facility]  C-SSRS RISK CATEGORY No Risk No Risk No Risk       Past Medical History:  Past Medical History:  Diagnosis Date   Cannabis use disorder 10/16/2020   Schizophrenia ([place])    Strain of right Achilles tendon 04/07/2019   Tobacco use disorder 06/14/2022    Past Surgical History:  Procedure Laterality Date   stye removal     WISDOM TOOTH EXTRACTION     Family History: History reviewed. No pertinent family history.  Social History:  Social History   Substance and Sexual Activity  Alcohol Use Never     Social History   Substance and Sexual Activity  Drug Use Not Currently   Types: Marijuana    Social History   Socioeconomic History   Marital status: Single    Spouse name: Not on file   Number of children: Not on file   Years of education: Not on file   Highest education level: Not on file  Occupational History   Not on file  Tobacco Use   Smoking status: Every Day    Packs/day: 0.50    Types: Cigarettes   Smokeless tobacco: Never  Vaping Use   Vaping Use: Every day   Substances: CBD  Substance and Sexual Activity   Alcohol use: Never   Drug use: Not Currently    Types: Marijuana   Sexual activity: Not on file  Other Topics Concern   Not on file  Social History Narrative   Not on file   Social Determinants of Health   Financial Resource Strain: Not on file  Food Insecurity: Unknown  (06/14/2022)   Hunger Vital Sign    Worried About Running Out of Food in the Last Year: Patient refused    [place] in the Last Year: Patient refused  Transportation Needs: Unknown (06/14/2022)   PRAPARE - Hydrologist (Medical): Patient refused    Lack of Transportation (Non-Medical): Patient refused  Physical Activity: Not on file  Stress: Not on file  Social Connections: Not on file    Sleep: Good  Appetite:  Fair  Current Medications: Current Facility-Administered Medications  Medication Dose Route Frequency Provider Last Rate Last Admin   amLODipine (NORVASC) tablet 10 mg  10 mg Oral Daily Rancour, Stephen, MD   10 mg at 09/01/22 1718   atorvastatin (LIPITOR) tablet 20 mg  20 mg Oral QHS Rancour, Stephen, MD   20 mg at 09/01/22 2149   benztropine (COGENTIN) tablet 0.5 mg  0.5 mg Oral BID Rancour, Stephen, MD   0.5 mg at 09/01/22 2139   divalproex (DEPAKOTE ER) 24 hr tablet 1,000 mg  1,000 mg Oral QHS Rancour, Annie Main, MD   1,000 mg at 09/01/22  2139   haloperidol (HALDOL) tablet 10 mg  10 mg Oral Q1200 Rancour, Stephen, MD       haloperidol (HALDOL) tablet 15 mg  15 mg Oral QHS Rancour, Annie Main, MD   15 mg at 09/01/22 2138   hydrochlorothiazide (HYDRODIURIL) tablet 12.5 mg  12.5 mg Oral Daily Rancour, Stephen, MD   12.5 mg at 09/01/22 1718   montelukast (SINGULAIR) tablet 10 mg  10 mg Oral QHS Rancour, Annie Main, MD   10 mg at 09/01/22 2138   propranolol ER (INDERAL LA) 24 hr capsule 120 mg  120 mg Oral QHS Rancour, Annie Main, MD   120 mg at 09/01/22 2138   Current Outpatient Medications  Medication Sig Dispense Refill   amLODipine (NORVASC) 10 MG tablet Take 1 tablet (10 mg total) by mouth daily. (Patient not taking: Reported on 09/02/2022) 30 tablet 0   atorvastatin (LIPITOR) 20 MG tablet Take 1 tablet (20 mg total) by mouth at bedtime. (Patient not taking: Reported on 09/02/2022) 30 tablet 0   benztropine (COGENTIN) 0.5 MG tablet Take 1 tablet (0.5  mg total) by mouth 2 (two) times daily. (Patient not taking: Reported on 09/02/2022) 60 tablet 0   divalproex (DEPAKOTE ER) 500 MG 24 hr tablet Take 2 tablets (1,000 mg total) by mouth at bedtime. (Patient not taking: Reported on 09/02/2022) 60 tablet 0   haloperidol (HALDOL) 10 MG tablet Take 1 tablet (10 mg total) by mouth daily at 12 noon. (Patient not taking: Reported on 09/02/2022) 30 tablet 0   haloperidol (HALDOL) 5 MG tablet Take 3 tablets (15 mg total) by mouth at bedtime. (Patient not taking: Reported on 09/02/2022) 90 tablet 0   hydrochlorothiazide (HYDRODIURIL) 12.5 MG tablet Take 1 tablet (12.5 mg total) by mouth daily. (Patient not taking: Reported on 09/02/2022) 30 tablet 0   montelukast (SINGULAIR) 10 MG tablet Take 1 tablet (10 mg total) by mouth at bedtime. (Patient not taking: Reported on 09/02/2022) 30 tablet 0   propranolol ER (INDERAL LA) 120 MG 24 hr capsule Take 1 capsule (120 mg total) by mouth at bedtime. (Patient not taking: Reported on 09/02/2022) 30 capsule 0    Lab Results:  Results for orders placed or performed during the hospital encounter of 09/01/22 (from the past 48 hour(s))  Ethanol     Status: None   Collection Time: 09/01/22  2:52 PM  Result Value Ref Range   Alcohol, Ethyl (B) <10 <10 mg/dL    Comment: (NOTE) Lowest detectable limit for serum alcohol is 10 mg/dL.  For medical purposes only. Performed at [facility], [place] [address] [place], [place] [address]   Acetaminophen level     Status: Abnormal   Collection Time: 09/01/22  2:52 PM  Result Value Ref Range   Acetaminophen (Tylenol), Serum <10 (L) 10 - 30 ug/mL    Comment: (NOTE) Therapeutic concentrations vary significantly. A range of 10-30 ug/mL  may be an effective concentration for many patients. However, some  are best treated at concentrations outside of this range. Acetaminophen concentrations >150 ug/mL at 4 hours after ingestion  and >50 ug/mL at 12 hours after ingestion are often  associated with  toxic reactions.  Performed at [facility], [place] [address], [place], [place] [id number]   Salicylate level     Status: Abnormal   Collection Time: 09/01/22  2:52 PM  Result Value Ref Range   Salicylate Lvl Q000111Q (L) 7.0 - 30.0 mg/dL    Comment: Performed at [facility], [place] [address], [place], [place] [address]  Ammonia     Status: None   Collection Time: 09/01/22  2:52 PM  Result Value Ref Range   Ammonia 16 9 - 35 umol/L    Comment: Performed at [facility], [place] [address], [place], [place] [address]  Rapid urine drug screen (hospital performed)     Status: Abnormal   Collection Time: 09/01/22  4:40 PM  Result Value Ref Range   Opiates NONE DETECTED NONE DETECTED   Cocaine NONE DETECTED NONE DETECTED   Benzodiazepines NONE DETECTED NONE DETECTED   Amphetamines NONE DETECTED NONE DETECTED   Tetrahydrocannabinol POSITIVE (A) NONE DETECTED   Barbiturates NONE DETECTED NONE DETECTED    Comment: (NOTE) DRUG SCREEN FOR MEDICAL PURPOSES ONLY.  IF CONFIRMATION IS NEEDED FOR ANY PURPOSE, NOTIFY LAB WITHIN 5 DAYS.  LOWEST DETECTABLE LIMITS FOR URINE DRUG SCREEN Drug Class                     Cutoff (ng/mL) Amphetamine and metabolites    1000 Barbiturate and metabolites    200 Benzodiazepine                 200 Opiates and metabolites        300 Cocaine and metabolites        300 THC                            50 Performed at [facility], [place] [address] [place], [place] [address]   Urinalysis, Routine w reflex microscopic Urine, Clean Catch     Status: Abnormal   Collection Time: 09/01/22  4:40 PM  Result Value Ref Range   Color, Urine YELLOW YELLOW   APPearance HAZY (A) CLEAR   Specific Gravity, Urine 1.025 1.005 - 1.030   pH 5.0 5.0 - 8.0   Glucose, UA NEGATIVE NEGATIVE mg/dL   Hgb urine dipstick NEGATIVE NEGATIVE   Bilirubin Urine NEGATIVE NEGATIVE   Ketones, ur NEGATIVE NEGATIVE mg/dL   Protein, ur 30 (A) NEGATIVE mg/dL    Nitrite NEGATIVE NEGATIVE   Leukocytes,Ua NEGATIVE NEGATIVE   RBC / HPF 0-5 0 - 5 RBC/hpf   WBC, UA 0-5 0 - 5 WBC/hpf   Bacteria, UA NONE SEEN NONE SEEN   Mucus PRESENT     Comment: Performed at [place] [address], [place], [place] [address]  CBC with Differential     Status: None   Collection Time: 09/01/22  4:55 PM  Result Value Ref Range   WBC 4.8 4.0 - 10.5 K/uL   RBC 4.79 4.22 - 5.81 MIL/uL   Hemoglobin 13.5 13.0 - 17.0 g/dL   HCT 40.5 39.0 - 52.0 %   MCV 84.6 80.0 - 100.0 fL   MCH 28.2 26.0 - 34.0 pg   MCHC 33.3 30.0 - 36.0 g/dL   RDW 15.1 11.5 - 15.5 %   Platelets 244 150 - 400 K/uL   nRBC 0.0 0.0 - 0.2 %   Neutrophils Relative % 60 %   Neutro Abs 2.8 1.7 - 7.7 K/uL   Lymphocytes Relative 29 %   Lymphs Abs 1.4 0.7 - 4.0 K/uL   Monocytes Relative 8 %   Monocytes Absolute 0.4 0.1 - 1.0 K/uL   Eosinophils Relative 2 %   Eosinophils Absolute 0.1 0.0 - 0.5 K/uL   Basophils Relative 1 %   Basophils  Absolute 0.0 0.0 - 0.1 K/uL   Immature Granulocytes 0 %   Abs Immature Granulocytes 0.02 0.00 - 0.07 K/uL    Comment: Performed at [facility], [address], [place], [place] [address]  Comprehensive metabolic panel     Status: Abnormal   Collection Time: 09/01/22  4:55 PM  Result Value Ref Range   Sodium 135 135 - 145 mmol/L   Potassium 3.4 (L) 3.5 - 5.1 mmol/L   Chloride 103 98 - 111 mmol/L   CO2 24 22 - 32 mmol/L   Glucose, Bld 109 (H) 70 - 99 mg/dL    Comment: Glucose reference range applies only to samples taken after fasting for at least 8 hours.   BUN 8 6 - 20 mg/dL   Creatinine, Ser 9.47 (H) 0.61 - 1.24 mg/dL   Calcium 9.3 8.9 - 09.6 mg/dL   Total Protein 7.6 6.5 - 8.1 g/dL   Albumin 3.9 3.5 - 5.0 g/dL   AST 24 15 - 41 U/L   ALT 34 0 - 44 U/L   Alkaline Phosphatase 71 38 - 126 U/L   Total Bilirubin 1.4 (H) 0.3 - 1.2 mg/dL   GFR, Estimated >28 >36 mL/min    Comment: (NOTE) Calculated using the CKD-EPI Creatinine Equation (2021)     Anion gap 8 5 - 15    Comment: Performed at [facility], [address] [place], [place] [address]  Valproic acid level     Status: Abnormal   Collection Time: 09/01/22  4:55 PM  Result Value Ref Range   Valproic Acid Lvl <10 (L) 50.0 - 100.0 ug/mL    Comment: RESULTS CONFIRMED BY MANUAL DILUTION Performed at [facility], [address] [place], [place] [address]   Resp Panel by RT-PCR (Flu A&B, Covid) Anterior Nasal Swab     Status: None   Collection Time: 09/02/22  8:31 AM   Specimen: Anterior Nasal Swab  Result Value Ref Range   SARS Coronavirus 2 by RT PCR NEGATIVE NEGATIVE    Comment: (NOTE) SARS-CoV-2 target nucleic acids are NOT DETECTED.  The SARS-CoV-2 RNA is generally detectable in upper respiratory specimens during the acute phase of infection. The lowest concentration of SARS-CoV-2 viral copies this assay can detect is 138 copies/mL. A negative result does not preclude SARS-Cov-2 infection and should not be used as the sole basis for treatment or other patient management decisions. A negative result may occur with  improper specimen collection/handling, submission of specimen other than nasopharyngeal swab, presence of viral mutation(s) within the areas targeted by this assay, and inadequate number of viral copies(<138 copies/mL). A negative result must be combined with clinical observations, patient history, and epidemiological information. The expected result is Negative.  Fact Sheet for Patients:  BloggerCourse.com  Fact Sheet for Healthcare Providers:  SeriousBroker.it  This test is no t yet approved or cleared by the [place] FDA and  has been authorized for detection and/or diagnosis of SARS-CoV-2 by FDA under an Emergency Use Authorization (EUA). This EUA will remain  in effect (meaning this test can be used) for the duration of the COVID-19 declaration under Section 564(b)(1) of the Act,  21 U.S.C.section 360bbb-3(b)(1), unless the authorization is terminated  or revoked sooner.       Influenza A by PCR NEGATIVE NEGATIVE   Influenza B by PCR NEGATIVE NEGATIVE    Comment: (NOTE) The Xpert Xpress SARS-CoV-2/FLU/RSV plus assay is intended as an aid in the diagnosis of  influenza from Nasopharyngeal swab specimens and should not be used as a sole basis for treatment. Nasal washings and aspirates are unacceptable for Xpert Xpress SARS-CoV-2/FLU/RSV testing.  Fact Sheet for Patients: EntrepreneurPulse.com.au  Fact Sheet for Healthcare Providers: IncredibleEmployment.be  This test is not yet approved or cleared by the [place] FDA and has been authorized for detection and/or diagnosis of SARS-CoV-2 by FDA under an Emergency Use Authorization (EUA). This EUA will remain in effect (meaning this test can be used) for the duration of the COVID-19 declaration under Section 564(b)(1) of the Act, 21 U.S.C. section 360bbb-3(b)(1), unless the authorization is terminated or revoked.  Performed at [facility], [place] [address] [place], [place] [address]     Blood Alcohol level:  Lab Results  Component Value Date   [facility] <10 09/01/2022   ETH <10 06/08/2022    Physical Findings:    Psychiatric Specialty Exam:  Presentation  General Appearance:  Appropriate for Environment  Eye Contact: Fair  Speech: Clear and Coherent  Speech Volume: Increased  Handedness: Right   Mood and Affect  Mood: Irritable; Labile  Affect: Blunt   Thought Process  Thought Processes: Coherent  Descriptions of Associations:Intact  Orientation:Full (Time, Place and Person)  Thought Content:WDL  History of Schizophrenia/Schizoaffective disorder:Yes  Duration of Psychotic Symptoms:Greater than six months  Hallucinations:Hallucinations: None  Ideas of Reference:None  Suicidal Thoughts:Suicidal Thoughts: No  Homicidal  Thoughts:Homicidal Thoughts: No   Sensorium  Memory: Recent Poor; Remote Poor; Immediate Fair  Judgment: Impaired  Insight: Lacking   Executive Functions  Concentration: Fair  Attention Span: Poor  Recall: [employer] of Knowledge: Fair  Language: Good   Psychomotor Activity  Psychomotor Activity: Psychomotor Activity: Normal   Assets  Assets: Communication Skills; Desire for Improvement; Physical Health   Sleep  Sleep: Sleep: Good    Physical Exam: Physical Exam Constitutional:      Appearance: Normal appearance.  HENT:     Head: Normocephalic.  Eyes:     Extraocular Movements: Extraocular movements intact.     Pupils: Pupils are equal, round, and reactive to light.  Cardiovascular:     Rate and Rhythm: Normal rate.  Pulmonary:     Effort: Pulmonary effort is normal.  Musculoskeletal:     Cervical back: Normal range of motion.  Neurological:     General: No focal deficit present.     Mental Status: He is alert.    Review of Systems  Psychiatric/Behavioral:  Positive for substance abuse. Negative for depression and suicidal ideas.    Blood pressure 132/73, pulse 76, temperature 97.7 F (36.5 C), temperature source Oral, resp. rate 20, SpO2 100 %. There is no height or weight on file to calculate BMI.   Medical Decision Making: Patient case reviewed and discussed with Dr. Dwyane Dee, and patient meets inpatient criteria for inpatient psychiatric treatment.  Patient is unable to reliably contract for safety at this time. There is currently no bed availability at [facility].  CSW notified and will be faxing patient out. EDP, RN, LCSW, notified of disposition.   Problem 1:Schizoaffective disorder, continue Haldol BID. Cogentin daily for EPS prophylaxis.   Molli Barrows, FNP-C, PMHNP-BC 09/02/2022, 11:10 AM

## 2022-09-02 NOTE — Progress Notes (Signed)
[facility] LCSW Progress Note   09/02/2022    8:38 PM  Gregory Saunders   [id number]   Type of Contact and Topic:  Psychiatric Bed Placement   Pt accepted to [facility]     Patient meets inpatient criteria per Joaquin Courts, FNP  The attending provider will be Estill Cotta, MD  Call report to [phone]  Debara Pickett, RN @ [facility] ED notified.     Pt scheduled  to arrive at [facility] AFTER 0900. Please fax IVC paperwork to [phone].    Damita Dunnings, MSW, LCSW-A  8:40 PM 09/02/2022

## 2022-09-02 NOTE — Progress Notes (Signed)
Pt is under review at [facility]. This CSW has reach out to nursing Adonis Brook, RN and has requested that RN call [facility] intake at [phone] to provide updates. CSW will assist and follow.   Maryjean Ka, MSW, LCSWA 09/02/2022 2:25 PM

## 2022-09-02 NOTE — ED Notes (Signed)
TTS in process 

## 2022-09-02 NOTE — ED Notes (Signed)
Snack bag and drink provided

## 2022-09-02 NOTE — ED Provider Notes (Signed)
Emergency Medicine Observation Re-evaluation Note  Gregory Saunders is a 26 y.o. male, seen on rounds today.  Pt initially presented to the ED for complaints of Psychiatric Evaluation Currently, the patient is awaiting psychiatric admission  Physical Exam  BP 132/73 (BP Location: Right Arm)   Pulse 76   Temp 97.7 F (36.5 C) (Oral)   Resp 20   SpO2 100%  Physical Exam Alert in no acute distress  ED Course / MDM  EKG:   I have reviewed the labs performed to date as well as medications administered while in observation.  Recent changes in the last 24 hours include none.  Plan  Current plan is for psychiatric admission.    Bethann Berkshire, MD 09/02/22 1049

## 2022-09-02 NOTE — Progress Notes (Signed)
Inpatient Behavioral Health Placement  Pt meets inpatient criteria per [facility]. There are no available beds at [facility].  Referral was sent to the following facilities;   Destination  Service Provider Address Phone Fax  [facility]  [address] [place] [address] [phone]  [facility]  [address] [place], [place] [address] [phone]  [facility]  [address] [place] [address] [phone]  [facility]  [address], [place] [address] [phone]  [facility]  [address] [place], [place] [address] [phone]  [facility]  [address] [place] [address] [phone]  [facility]  [address] [place] [address] [phone]  [facility]  [address] [place] [address] [phone]  [facility]  [address], [place] [address] [phone]  [facility]  [address] [place] [address] [phone]  [facility]  [address] [place] [address] [phone]  [facility]  [address], [place] [address] [phone]  [facility]  [address] [place] [address] [phone]  [facility]  [address], [place] [address] [phone]    Situation ongoing,  CSW will follow up.   Gregory Saunders, MSW, LCSWA 09/02/2022  @ 12:40 PM

## 2022-09-03 NOTE — ED Notes (Signed)
TC to PT's Mother and Family will pick up crock shoes today. Shoes placed in locker #1

## 2022-09-03 NOTE — ED Notes (Signed)
TC from [facility] and this Clinical research associate gave report. This Oceanographer for transport request to [employer].

## 2022-09-03 NOTE — ED Provider Notes (Signed)
Emergency Medicine Observation Re-evaluation Note  Gregory Saunders is a 26 y.o. male, seen on rounds today.  Pt initially presented to the ED for complaints of Psychiatric Evaluation Currently, the patient awaiting BH placement. No new c/o this AM.   Physical Exam  BP (!) 151/94 (BP Location: Right Arm)   Pulse 99   Temp (!) 97.4 F (36.3 C)   Resp 17   SpO2 96%  Physical Exam General: resting comfortably. Cardiac: regular rate. Lungs: breathing comfortably. Psych: resting comfortably, calm.   ED Course / MDM   I have reviewed the labs performed to date as well as medications administered while in observation.  Recent changes in the last 24 hours include [facility] consultation, med management, and placement.   Plan  Current plan has been accepted to [facility], Dr Loyola Mast.   Pt currently appears stable for transfer/transport.       Cathren Laine, MD 09/03/22 [phone]

## 2022-09-03 NOTE — ED Notes (Signed)
IVC paper work Faxed to [facility] 

## 2022-09-03 NOTE — ED Notes (Signed)
Number provided for [facility] called for report. This writer left message as directed with call back #.

## 2022-09-03 NOTE — Discharge Instructions (Addendum)
Transfer to [facility] 

## 2022-09-27 ENCOUNTER — Emergency Department
Admission: EM | Admit: 2022-09-27 | Discharge: 2022-09-28 | Disposition: A | Discharge status: Home / Self Care | Payer: No Typology Code available for payment source | Attending: Emergency Medicine | Admitting: Emergency Medicine

## 2022-09-27 ENCOUNTER — Other Ambulatory Visit: Payer: Self-pay

## 2022-09-27 DIAGNOSIS — Z20822 Contact with and (suspected) exposure to covid-19: Secondary | ICD-10-CM | POA: Insufficient documentation

## 2022-09-27 DIAGNOSIS — F17210 Nicotine dependence, cigarettes, uncomplicated: Secondary | ICD-10-CM | POA: Insufficient documentation

## 2022-09-27 DIAGNOSIS — F250 Schizoaffective disorder, bipolar type: Secondary | ICD-10-CM | POA: Diagnosis not present

## 2022-09-27 DIAGNOSIS — F209 Schizophrenia, unspecified: Secondary | ICD-10-CM

## 2022-09-27 DIAGNOSIS — R443 Hallucinations, unspecified: Secondary | ICD-10-CM

## 2022-09-27 LAB — CBC WITH DIFFERENTIAL/PLATELET
Abs Immature Granulocytes: 0.04 10*3/uL (ref 0.00–0.07)
Basophils Absolute: 0 10*3/uL (ref 0.0–0.1)
Basophils Relative: 0 %
Eosinophils Absolute: 0.1 10*3/uL (ref 0.0–0.5)
Eosinophils Relative: 1 %
HCT: 41.8 % (ref 39.0–52.0)
Hemoglobin: 13.8 g/dL (ref 13.0–17.0)
Immature Granulocytes: 1 %
Lymphocytes Relative: 36 %
Lymphs Abs: 2.7 10*3/uL (ref 0.7–4.0)
MCH: 27.4 pg (ref 26.0–34.0)
MCHC: 33 g/dL (ref 30.0–36.0)
MCV: 82.9 fL (ref 80.0–100.0)
Monocytes Absolute: 0.7 10*3/uL (ref 0.1–1.0)
Monocytes Relative: 9 %
Neutro Abs: 4.1 10*3/uL (ref 1.7–7.7)
Neutrophils Relative %: 53 %
Platelets: 255 10*3/uL (ref 150–400)
RBC: 5.04 MIL/uL (ref 4.22–5.81)
RDW: 13.7 % (ref 11.5–15.5)
WBC: 7.6 10*3/uL (ref 4.0–10.5)
nRBC: 0 % (ref 0.0–0.2)

## 2022-09-27 LAB — URINE DRUG SCREEN, QUALITATIVE (ARMC ONLY)
Amphetamines, Ur Screen: NOT DETECTED
Barbiturates, Ur Screen: NOT DETECTED
Benzodiazepine, Ur Scrn: NOT DETECTED
Cannabinoid 50 Ng, Ur ~~LOC~~: POSITIVE — AB
Cocaine Metabolite,Ur ~~LOC~~: NOT DETECTED
MDMA (Ecstasy)Ur Screen: NOT DETECTED
Methadone Scn, Ur: NOT DETECTED
Opiate, Ur Screen: NOT DETECTED
Phencyclidine (PCP) Ur S: NOT DETECTED
Tricyclic, Ur Screen: POSITIVE — AB

## 2022-09-27 LAB — BASIC METABOLIC PANEL
Anion gap: 7 (ref 5–15)
BUN: 13 mg/dL (ref 6–20)
CO2: 27 mmol/L (ref 22–32)
Calcium: 9.7 mg/dL (ref 8.9–10.3)
Chloride: 109 mmol/L (ref 98–111)
Creatinine, Ser: 1.24 mg/dL (ref 0.61–1.24)
GFR, Estimated: >60 mL/min (ref >60)
Glucose, Bld: 99 mg/dL (ref 70–99)
Potassium: 3.8 mmol/L (ref 3.5–5.1)
Sodium: 143 mmol/L (ref 135–145)

## 2022-09-27 LAB — RESP PANEL BY RT-PCR (RSV, FLU A&B, COVID)  RVPGX2
Influenza A by PCR: NEGATIVE
Influenza B by PCR: NEGATIVE
Resp Syncytial Virus by PCR: NEGATIVE
SARS Coronavirus 2 by RT PCR: NEGATIVE

## 2022-09-27 LAB — VALPROIC ACID LEVEL: Valproic Acid Lvl: 107 ug/mL — ABNORMAL HIGH (ref 50.0–100.0)

## 2022-09-27 LAB — ETHANOL: Alcohol, Ethyl (B): <10 mg/dL (ref <10)

## 2022-09-27 MED ORDER — DIVALPROEX SODIUM ER 500 MG PO TB24
1000.0000 mg | ORAL_TABLET | Freq: Every day | ORAL | Status: DC
  Administered 2022-09-27: 1000 mg via ORAL
  Filled 2022-09-27: qty 2

## 2022-09-27 MED ORDER — BENZTROPINE MESYLATE 1 MG PO TABS
1.0000 mg | ORAL_TABLET | Freq: Every day | ORAL | Status: DC
  Administered 2022-09-27 – 2022-09-28 (×2): 1 mg via ORAL
  Filled 2022-09-27 (×2): qty 1

## 2022-09-27 MED ORDER — HALOPERIDOL 5 MG PO TABS
5.0000 mg | ORAL_TABLET | Freq: Two times a day (BID) | ORAL | Status: DC
  Administered 2022-09-27 – 2022-09-28 (×2): 5 mg via ORAL
  Filled 2022-09-27 (×2): qty 1

## 2022-09-27 MED ORDER — TRAZODONE HCL 100 MG PO TABS
100.0000 mg | ORAL_TABLET | Freq: Every evening | ORAL | Status: DC | PRN
  Administered 2022-09-27: 100 mg via ORAL
  Filled 2022-09-27: qty 1

## 2022-09-27 NOTE — ED Notes (Signed)
Pt denies need for snack and is provided with drink at this time

## 2022-09-27 NOTE — ED Triage Notes (Addendum)
First Nurse Note;  Pt via BPD from [facility] ([address] [place]). Pt is voluntary.

## 2022-09-27 NOTE — ED Notes (Signed)
Report from Tobi Bastos, [place], pt to be transferred to [facility] shortly and will take over care.

## 2022-09-27 NOTE — ED Notes (Signed)
Pt dressed out into hospital provided scrubs by Jae Dire, RN, and Brandon, EDT. Pts belongings include:  Psychologist, clinical [employer] winter coat Blue t-shirt White t-shirt [employer] Black socks [employer] glasses (kept on pt due to vision impairment) Pack of cigarettes Lighter  Pt denied having a phone, wallet, or anything else with him at this time.

## 2022-09-27 NOTE — ED Triage Notes (Signed)
Schizophrenic patient presents today with auditory and visual hallucinations of spirits that are talking to him; He states that he has not been on medications "for a while, like years" but EMR shows that he was here earlier this month for the same and went to [facility]; He denies SI/Hi at this time

## 2022-09-27 NOTE — ED Provider Notes (Signed)
[facility] Provider Note    Event Date/Time   First MD Initiated Contact with Patient 09/27/22 1718     (approximate)   History   Hallucinations  HPI  Gregory Saunders is a 26 y.o. male  who presents to the emergency department today with concern for increasing hallucinations. The patient states that he has been having them for a while but they seem to be worse over the past week. The patient denies any medical complaints.      Physical Exam   Triage Vital Signs: ED Triage Vitals  Enc Vitals Group     BP 09/27/22 1658 (!) 136/91     Pulse Rate 09/27/22 1658 87     Resp 09/27/22 1658 18     Temp 09/27/22 1658 98.9 F (37.2 C)     Temp Source 09/27/22 1658 Oral     SpO2 09/27/22 1658 98 %     Weight 09/27/22 1703 287 lb 0.6 oz (130.2 kg)     Height 09/27/22 1703 6\' 2"  (1.88 m)     Head Circumference --      Peak Flow --      Pain Score 09/27/22 1703 0     Pain Loc --      Pain Edu? --      Excl. in GC? --     Most recent vital signs: Vitals:   09/27/22 1658  BP: (!) 136/91  Pulse: 87  Resp: 18  Temp: 98.9 F (37.2 C)  SpO2: 98%   General: Awake, alert, oriented. CV:  Good peripheral perfusion. Regular rate and rhythm. Resp:  Normal effort. Lungs clear. Abd:  No distention.     ED Results / Procedures / Treatments   Labs (all labs ordered are listed, but only abnormal results are displayed) Labs Reviewed  VALPROIC ACID LEVEL - Abnormal; Notable for the following components:      Result Value   Valproic Acid Lvl 107 (*)    All other components within normal limits  URINE DRUG SCREEN, QUALITATIVE ([facility] ONLY) - Abnormal; Notable for the following components:   Tricyclic, Ur Screen POSITIVE (*)    Cannabinoid 50 Ng, Ur [place] POSITIVE (*)    All other components within normal limits  RESP PANEL BY RT-PCR (RSV, FLU A&B, COVID)  RVPGX2  CBC WITH DIFFERENTIAL/PLATELET  BASIC METABOLIC PANEL  ETHANOL      EKG  None   RADIOLOGY None   PROCEDURES:  Critical Care performed: No  Procedures   MEDICATIONS ORDERED IN ED: Medications - No data to display   IMPRESSION / MDM / ASSESSMENT AND PLAN / ED COURSE  I reviewed the triage vital signs and the nursing notes.                              Differential diagnosis includes, but is not limited to, schizophrenia, drug induced mental disorder.  Patient's presentation is most consistent with acute presentation with potential threat to life or bodily function.  Patient present to the emergency department today because of concerns for increased hallucinations.  On my exam patient is calm and does not appear to be responding to internal stimuli.  Will have psychiatry evaluate.  The patient has been placed in psychiatric observation due to the need to provide a safe environment for the patient while obtaining psychiatric consultation and evaluation, as well as ongoing medical and medication management to treat the  patient's condition.  The patient has not been placed under full IVC at this time.      FINAL CLINICAL IMPRESSION(S) / ED DIAGNOSES   Final diagnoses:  Hallucinations    Note:  This document was prepared using Dragon voice recognition software and may include unintentional dictation errors.    Phineas Semen, MD 09/27/22 [phone]

## 2022-09-27 NOTE — BH Assessment (Signed)
Comprehensive Clinical Assessment (CCA) Note  09/27/2022 Gregory Saunders [id number] Recommendations for Services/Supports/Treatments: Pt pending psych consult/disposition.  Gregory Saunders is a 27 year old, English speaking, Black male with a history of schizoaffective disorder. Pt also has a hx of cannabis use disorder and treatment noncompliance. Pt presented to [facility] ED voluntarily via BPD due to complaints of worsening AV/H. Per patient report, his main stressors are living in a place that he doesn't want to be (Group Home setting). Pt seemed to be preoccupied with his delusions that spirits conversate with him and look to him for guidance about the afterlife. The pt. reported worsening auditory and visual hallucinations that are problematic as they seem increasingly physical. Pt reported that they are not command hallucinations. Upon chart review, pt. was recently discharged from [facility]. Pt reports that he is medication compliant. Pt explained that he has worsening depression and anxiety. Pt reported that he has poor sleep and that he hasn't slept in 3 weeks. Pt requested sleeping medication from his attending nurse despite being sound asleep upon this writer's arrival. Pt was oriented to person and place. Pt had fair insight and judgement. Pt had clear and linear speech. Pt was cooperative and calm throughout the assessment. Pt presented with an anxious mood and a responsive affect. Pt had disheveled appearance; eye contact was good.The patient denied current SI or HI. Pt's BAL was unremarkable; UDS + for cannabis.  Chief Complaint:  Chief Complaint  Patient presents with   Mental Health Problem    Schizophrenic patient presents today with auditory and visual hallucinations of spirits that are talking to him; He states that he has not been on medications "for a while, like years" but EMR shows that he was here earlier this month for the same and went to [facility]; He denies SI/Hi at this time    Visit Diagnosis: Schizoaffective disorder, bipolar type    CCA Screening, Triage and Referral (STR)  Patient Reported Information How did you hear about [place]? Other (Comment) ([employer])  Referral name: No data recorded Referral phone number: No data recorded  Whom do you see for routine medical problems? No data recorded Practice/Facility Name: No data recorded Practice/Facility Phone Number: No data recorded Name of Contact: No data recorded Contact Number: No data recorded Contact Fax Number: No data recorded Prescriber Name: No data recorded Prescriber Address (if known): No data recorded  What Is the Reason for Your Visit/Call Today? Schizophrenic patient presents today with auditory and visual hallucinations of spirits that are talking to him; He states that he has not been on medications "for a while, like years" but EMR shows that he was here earlier this month for the same and went to [facility]  How Long Has This Been Causing You Problems? > than 6 months  What Do You Feel Would Help You the Most Today? Treatment for Depression or other mood problem   Have You Recently Been in Any Inpatient Treatment (Hospital/Detox/Crisis Center/28-Day Program)? No data recorded Name/Location of Program/Hospital:No data recorded How Long Were You There? No data recorded When Were You Discharged? No data recorded  Have You Ever Received Services From [facility] Before? No data recorded Who Do You See at [facility]? No data recorded  Have You Recently Had Any Thoughts About Hurting Yourself? No  Are You Planning to Commit Suicide/Harm Yourself At This time? No   Have you Recently Had Thoughts About Hurting Someone Gregory Saunders? No  Explanation: N/A   Have You Used  Any Alcohol or Drugs in the Past 24 Hours? No  How Long Ago Did You Use Drugs or Alcohol? No data recorded What Did You Use and How Much? N/A   Do You Currently Have a Therapist/Psychiatrist? Yes  Name of  Therapist/Psychiatrist: Group Home   Have You Been Recently Discharged From Any Office Practice or Programs? Yes  Explanation of Discharge From Practice/Program: [facility]     CCA Screening Triage Referral Assessment Type of Contact: Face-to-Face  Is this Initial or Reassessment? Initial Assessment  Date Telepsych consult ordered in [facility]:  09/01/22  [facility]   Patient Reported Information Reviewed? No data recorded Patient Left Without Being Seen? No data recorded Reason for Not Completing Assessment: No data recorded  Collateral Involvement: None.   Does Patient Have a Automotive engineerCourt Appointed Legal Guardian? No data recorded Name and Contact of Legal Guardian: No data recorded If Minor and Not Living with Parent(s), Who has Custody? n/a  Is CPS involved or ever been involved? Never  Is APS involved or ever been involved? Never   Patient Determined To Be At Risk for Harm To Self or Others Based on Review of Patient Reported Information or Presenting Complaint? No  Method: No Plan  Availability of Means: No access or NA  Intent: Vague intent or NA  Notification Required: No need or identified person  Additional Information for Danger to Others Potential: -- (na)  Additional Comments for Danger to Others Potential: NA  Are There Guns or Other Weapons in Your Home? No  Types of Guns/Weapons: NA  Are These Weapons Safely Secured?                            -- (NA)  Who Could Verify You Are Able To Have These Secured: NA  Do You Have any Outstanding Charges, Pending Court Dates, Parole/Probation? Pt denies.  Contacted To Inform of Risk of Harm To Self or Others: -- (NA)   [facility]   Does Patient Present under Involuntary Commitment? No  IVC Papers Initial File Date: No data recorded  [place]   Patient Currently Receiving the Following Services:  Medication Management; Group Home   Determination of Need: Emergent (2 hours)   Options For Referral: ED Visit     CCA Biopsychosocial Intake/Chief Complaint:  No data recorded Current Symptoms/Problems: No data recorded  Patient Reported Schizophrenia/Schizoaffective Diagnosis in Past: Yes   Strengths: Pt can contract for safety; pt is receptive to recieving treatment  Preferences: No data recorded Abilities: No data recorded  Type of Services Patient Feels are Needed: No data recorded  Initial Clinical Notes/Concerns: No data recorded  Mental Health Symptoms Depression:   Change in energy/activity; Sleep (too much or little) (Low mood)   Duration of Depressive symptoms:  Greater than two weeks   Mania:   None   Anxiety:    Worrying; Tension   Psychosis:   Hallucinations; Delusions   Duration of Psychotic symptoms:  Greater than six months   Trauma:   None   Obsessions:   None   Compulsions:   None   Inattention:   None   Hyperactivity/Impulsivity:   None   Oppositional/Defiant Behaviors:   Angry   Emotional Irregularity:   None   Other Mood/Personality Symptoms:   Pt presents with depressive symptoms.    Mental Status Exam Appearance and  self-care  Stature:   Tall   Weight:   Overweight   Clothing:   -- (In scrubs)   Grooming:   Neglected   Cosmetic use:   None   Posture/gait:   Normal   Motor activity:   Tremor   Sensorium  Attention:   Normal   Concentration:   Normal   Orientation:   Time; Situation; Place; Object; Person   Recall/memory:   Normal   Affect and Mood  Affect:   Congruent   Mood:   Anxious   Relating  Eye contact:   Normal   Facial expression:   Responsive   Attitude toward examiner:   Cooperative   Thought and Language  Speech flow:  Normal   Thought content:   Appropriate to Mood and Circumstances   Preoccupation:   None   Hallucinations:   Auditory; Visual    Organization:  No data recorded  [employer] of Knowledge:   Fair   Intelligence:   Average   Abstraction:   Abstract   Judgement:   Poor   Reality Testing:   Distorted   Insight:   Fair   Decision Making:   Impulsive   Social Functioning  Social Maturity:   Impulsive   Social Judgement:   "Street Smart"   Stress  Stressors:   Other (Comment) (Living arrangements)   Coping Ability:   -- (UTA)   Skill Deficits:   Decision making   Supports:   Family; Friends/Service system     Religion: Religion/Spirituality Are You A Religious Person?: Yes What is Your Religious Affiliation?: Baptist How Might This Affect Treatment?: NA  Leisure/Recreation: Leisure / Recreation Do You Have Hobbies?: Yes Leisure and Hobbies: Pt reports, smokes and drinks.  Exercise/Diet: Exercise/Diet Do You Exercise?: No Have You Gained or Lost A Significant Amount of Weight in the Past Six Months?:  (n/a) Do You Follow a Special Diet?: No Do You Have Any Trouble Sleeping?: Yes Explanation of Sleeping Difficulties: Pt reprots, he has't slept in 3 weeks.   CCA Employment/Education Employment/Work Situation: Employment / Work Situation Employment Situation: On disability Why is Patient on Disability: Schizophrenia. How Long has Patient Been on Disability: Since 2018. Patient's Job has Been Impacted by Current Illness: No Has Patient ever Been in the [employer]?: No  Education: Education Is Patient Currently Attending School?: No Last Grade Completed: 12 Did You Attend College?: Yes What Type of College Degree Do you Have?: Pt reports, he completed two years of school but did not graduate. Did You Have An Individualized Education Program (IIEP): No Did You Have Any Difficulty At School?: No Patient's Education Has Been Impacted by Current Illness: No   CCA Family/Childhood History Family and Relationship History: Family history Marital status:  Single Does patient have children?: No  Childhood History:  Childhood History By whom was/is the patient raised?: Adoptive parents Did patient suffer any verbal/emotional/physical/sexual abuse as a child?: No Did patient suffer from severe childhood neglect?: No Has patient ever been sexually abused/assaulted/raped as an adolescent or adult?: No Was the patient ever a victim of a crime or a disaster?: No Witnessed domestic violence?: No Has patient been affected by domestic violence as an adult?: No  Child/Adolescent Assessment:     CCA Substance Use Alcohol/Drug Use: Alcohol / Drug Use Pain Medications: See MAR Prescriptions: See MAR Over the Counter: See MAR History of alcohol / drug use?: No history of alcohol / drug abuse Longest period of sobriety (when/how long):  NA Withdrawal Symptoms: None                         ASAM's:  Six Dimensions of Multidimensional Assessment  Dimension 1:  Acute Intoxication and/or Withdrawal Potential:   Dimension 1:  Description of individual's past and current experiences of substance use and withdrawal: NA  Dimension 2:  Biomedical Conditions and Complications:   Dimension 2:  Description of patient's biomedical conditions and  complications: NA  Dimension 3:  Emotional, Behavioral, or Cognitive Conditions and Complications:  Dimension 3:  Description of emotional, behavioral, or cognitive conditions and complications: NA  Dimension 4:  Readiness to Change:  Dimension 4:  Description of Readiness to Change criteria: NA  Dimension 5:  Relapse, Continued use, or Continued Problem Potential:  Dimension 5:  Relapse, continued use, or continued problem potential critiera description: NA  Dimension 6:  Recovery/Living Environment:  Dimension 6:  Recovery/Iiving environment criteria description: NA  ASAM Severity Score:    ASAM Recommended Level of Treatment: ASAM Recommended Level of Treatment:  (NA)   Substance use Disorder  (SUD) Substance Use Disorder (SUD)  Checklist Symptoms of Substance Use:  (NA)  Recommendations for Services/Supports/Treatments: Recommendations for Services/Supports/Treatments Recommendations For Services/Supports/Treatments:  (NA)  DSM5 Diagnoses: Patient Active Problem List   Diagnosis Date Noted   Non compliance w medication regimen 09/02/2022   Essential hypertension 06/30/2022   Hyperlipidemia 06/30/2022   Hypothyroidism 06/14/2022   Prediabetes 06/14/2022   Tobacco use disorder 06/14/2022   OSA (obstructive sleep apnea) 01/12/2022   Schizoaffective disorder, bipolar type (HCC) 10/16/2020   Cannabis use disorder 10/16/2020   Gregory Saunders, LCAS

## 2022-09-28 DIAGNOSIS — F250 Schizoaffective disorder, bipolar type: Secondary | ICD-10-CM

## 2022-09-28 MED ORDER — DIVALPROEX SODIUM ER 250 MG PO TB24: 750.0000 mg | ORAL_TABLET | Freq: Every day | ORAL | 0 refills | Status: DC

## 2022-09-28 MED ORDER — DIVALPROEX SODIUM ER 500 MG PO TB24: 750.0000 mg | ORAL_TABLET | Freq: Every day | ORAL | Status: DC

## 2022-09-28 NOTE — ED Provider Notes (Signed)
Emergency Medicine Observation Re-evaluation Note  Gregory Saunders is a 27 y.o. male, seen on rounds today.  Pt initially presented to the ED for complaints of Mental Health Problem (Schizophrenic patient presents today with auditory and visual hallucinations of spirits that are talking to him; He states that he has not been on medications "for a while, like years" but EMR shows that he was here earlier this month for the same and went to [facility]; He denies SI/Hi at this time)   Physical Exam  BP (!) 110/56 (BP Location: Right Arm)   Pulse 69   Temp 98.6 F (37 C) (Oral)   Resp 16   Ht 6\' 2"  (1.88 m)   Wt 130.2 kg   SpO2 99%   BMI 36.85 kg/m  Physical Exam General: Resting comfortably in bed Lungs: Patient not in respiratory distress Psych: Patient not combative  ED Course / MDM  EKG:     Plan  Current plan is for psychiatric evaluation and treatment.    Nena Polio, MD 09/28/22 [phone]

## 2022-09-28 NOTE — ED Notes (Signed)
Patient is sitting in the dayroom, no signs of distress, denies Si/hi, does states that He hears non- threaten voices, they just get on my nerves.  Nurse will continue to monitor.

## 2022-09-28 NOTE — ED Notes (Signed)
VOL/pending psych consult 

## 2022-09-28 NOTE — ED Notes (Signed)
Patient refused his snack

## 2022-09-28 NOTE — ED Notes (Signed)
Patient ate 100% of lunch and beverage.  

## 2022-09-28 NOTE — Consult Note (Signed)
[facility] Face-to-Face Psychiatry Consult   Reason for Consult: Hallucinations Referring Physician:  EDP Patient Identification: Gregory Saunders MRN:  [id number] Principal Diagnosis: Schizoaffective disorder, bipolar type (HCC) Diagnosis:  Principal Problem:   Schizoaffective disorder, bipolar type (HCC)   Total Time spent with patient: 30 minutes  Subjective:   Gregory Saunders is a 27 y.o. male patient admitted with hallucinations.  HPI: Patient presented voluntarily to the ED via police department due to complaints of auditory and visual hallucinations.  There have been no behavioral incidents overnight.    On evaluation, patient is calm and cooperative.  He is sleeping comfortably and easily awakened.  Patient states that he really does not like to be in a group home and thinks he should probably stay in the hospital.  Denies suicidal or homicidal ideation.  Patient did not mention anything about having delusions of spirits, as he did last night.  It does not appear that patient is experiencing auditory or visual hallucinations, although he states that he sees "shadows."  Patient is speaking in clear, linear sentences.  He is making good eye contact.  Patient admits to marijuana use.  Discussed interactions between his medications and marijuana.  Patient does not accept that marijuana could be a problem for him. Patient does not appear to be at imminent risk to himself or others at time of evaluation.  His valproic acid level is increased at 107, so his Depakote was decreased to 250 mg at night with the prescription provided via electronic  prescription to pharmacy on file.  Patient's mother picked him up from this ED.  Notified mother of the medication changes.  Advised to follow up tomorrow with his outpatient providers.  It appears that he may have outpatient provider at [facility].  Past Psychiatric History: Schizoaffective disorder, bipolar type.  Recent hospitalization at [facility].   Risk to Self:    Risk to Others:   Prior Inpatient Therapy:   Prior Outpatient Therapy:    Past Medical History:  Past Medical History:  Diagnosis Date   Cannabis use disorder 10/16/2020   Schizophrenia (HCC)    Strain of right Achilles tendon 04/07/2019   Tobacco use disorder 06/14/2022    Past Surgical History:  Procedure Laterality Date   stye removal     WISDOM TOOTH EXTRACTION     Family History: No family history on file. Family Psychiatric  History: Unknown Social History:  Social History   Substance and Sexual Activity  Alcohol Use Never     Social History   Substance and Sexual Activity  Drug Use Not Currently   Types: Marijuana    Social History   Socioeconomic History   Marital status: Single    Spouse name: Not on file   Number of children: Not on file   Years of education: Not on file   Highest education level: Not on file  Occupational History   Not on file  Tobacco Use   Smoking status: Every Day    Packs/day: 0.50    Types: Cigarettes   Smokeless tobacco: Never  Vaping Use   Vaping Use: Every day   Substances: CBD  Substance and Sexual Activity   Alcohol use: Never   Drug use: Not Currently    Types: Marijuana   Sexual activity: Not on file  Other Topics Concern   Not on file  Social History Narrative   Not on file   Social Determinants of Health   Financial Resource Strain: Not on file  Food Insecurity: Unknown (06/14/2022)   Hunger Vital Sign    Worried About Running Out of Food in the Last Year: Patient refused    Ran Out of Food in the Last Year: Patient refused  Transportation Needs: Unknown (06/14/2022)   PRAPARE - Administrator, Civil Service (Medical): Patient refused    Lack of Transportation (Non-Medical): Patient refused  Physical Activity: Not on file  Stress: Not on file  Social Connections: Not on file   Additional Social History:    Allergies:   Allergies  Allergen Reactions   Amoxicillin     Childhood, possible  rash    Peanut (Diagnostic) Other (See Comments)   Penicillins Other (See Comments)    Unknown     Labs:  Results for orders placed or performed during the hospital encounter of 09/27/22 (from the past 48 hour(s))  CBC with Differential     Status: None   Collection Time: 09/27/22  5:09 PM  Result Value Ref Range   WBC 7.6 4.0 - 10.5 K/uL   RBC 5.04 4.22 - 5.81 MIL/uL   Hemoglobin 13.8 13.0 - 17.0 g/dL   HCT 67.6 19.5 - 09.3 %   MCV 82.9 80.0 - 100.0 fL   MCH 27.4 26.0 - 34.0 pg   MCHC 33.0 30.0 - 36.0 g/dL   RDW 26.7 12.4 - 58.0 %   Platelets 255 150 - 400 K/uL   nRBC 0.0 0.0 - 0.2 %   Neutrophils Relative % 53 %   Neutro Abs 4.1 1.7 - 7.7 K/uL   Lymphocytes Relative 36 %   Lymphs Abs 2.7 0.7 - 4.0 K/uL   Monocytes Relative 9 %   Monocytes Absolute 0.7 0.1 - 1.0 K/uL   Eosinophils Relative 1 %   Eosinophils Absolute 0.1 0.0 - 0.5 K/uL   Basophils Relative 0 %   Basophils Absolute 0.0 0.0 - 0.1 K/uL   Immature Granulocytes 1 %   Abs Immature Granulocytes 0.04 0.00 - 0.07 K/uL    Comment: Performed at [facility], [address] [place], [place] [address]  Basic metabolic panel     Status: None   Collection Time: 09/27/22  5:09 PM  Result Value Ref Range   Sodium 143 135 - 145 mmol/L   Potassium 3.8 3.5 - 5.1 mmol/L   Chloride 109 98 - 111 mmol/L   CO2 27 22 - 32 mmol/L   Glucose, Bld 99 70 - 99 mg/dL    Comment: Glucose reference range applies only to samples taken after fasting for at least 8 hours.   BUN 13 6 - 20 mg/dL   Creatinine, Ser 8.25 0.61 - 1.24 mg/dL   Calcium 9.7 8.9 - 05.3 mg/dL   GFR, Estimated >97 >67 mL/min    Comment: (NOTE) Calculated using the CKD-EPI Creatinine Equation (2021)    Anion gap 7 5 - 15    Comment: Performed at [facility], [address] [place], [place] [address]  Valproic acid level     Status: Abnormal   Collection Time: 09/27/22  5:09 PM  Result Value Ref Range   Valproic Acid Lvl 107 (H) 50.0 -  100.0 ug/mL    Comment: Performed at [facility], [address] [place], [place] [address]  Resp panel by RT-PCR (RSV, Flu A&B, Covid) Anterior Nasal Swab     Status: None   Collection Time: 09/27/22  5:09 PM   Specimen: Anterior Nasal Swab  Result Value Ref Range  SARS Coronavirus 2 by RT PCR NEGATIVE NEGATIVE    Comment: (NOTE) SARS-CoV-2 target nucleic acids are NOT DETECTED.  The SARS-CoV-2 RNA is generally detectable in upper respiratory specimens during the acute phase of infection. The lowest concentration of SARS-CoV-2 viral copies this assay can detect is 138 copies/mL. A negative result does not preclude SARS-Cov-2 infection and should not be used as the sole basis for treatment or other patient management decisions. A negative result may occur with  improper specimen collection/handling, submission of specimen other than nasopharyngeal swab, presence of viral mutation(s) within the areas targeted by this assay, and inadequate number of viral copies(<138 copies/mL). A negative result must be combined with clinical observations, patient history, and epidemiological information. The expected result is Negative.  Fact Sheet for Patients:  BloggerCourse.com  Fact Sheet for Healthcare Providers:  SeriousBroker.it  This test is no t yet approved or cleared by the [place] FDA and  has been authorized for detection and/or diagnosis of SARS-CoV-2 by FDA under an Emergency Use Authorization (EUA). This EUA will remain  in effect (meaning this test can be used) for the duration of the COVID-19 declaration under Section 564(b)(1) of the Act, 21 U.S.C.section 360bbb-3(b)(1), unless the authorization is terminated  or revoked sooner.       Influenza A by PCR NEGATIVE NEGATIVE   Influenza B by PCR NEGATIVE NEGATIVE    Comment: (NOTE) The Xpert Xpress SARS-CoV-2/FLU/RSV plus assay is intended as an aid in the  diagnosis of influenza from Nasopharyngeal swab specimens and should not be used as a sole basis for treatment. Nasal washings and aspirates are unacceptable for Xpert Xpress SARS-CoV-2/FLU/RSV testing.  Fact Sheet for Patients: BloggerCourse.com  Fact Sheet for Healthcare Providers: SeriousBroker.it  This test is not yet approved or cleared by the [place] FDA and has been authorized for detection and/or diagnosis of SARS-CoV-2 by FDA under an Emergency Use Authorization (EUA). This EUA will remain in effect (meaning this test can be used) for the duration of the COVID-19 declaration under Section 564(b)(1) of the Act, 21 U.S.C. section 360bbb-3(b)(1), unless the authorization is terminated or revoked.     Resp Syncytial Virus by PCR NEGATIVE NEGATIVE    Comment: (NOTE) Fact Sheet for Patients: BloggerCourse.com  Fact Sheet for Healthcare Providers: SeriousBroker.it  This test is not yet approved or cleared by the [place] FDA and has been authorized for detection and/or diagnosis of SARS-CoV-2 by FDA under an Emergency Use Authorization (EUA). This EUA will remain in effect (meaning this test can be used) for the duration of the COVID-19 declaration under Section 564(b)(1) of the Act, 21 U.S.C. section 360bbb-3(b)(1), unless the authorization is terminated or revoked.  Performed at [facility], [address] [place], [place] [address]   Ethanol     Status: None   Collection Time: 09/27/22  5:09 PM  Result Value Ref Range   Alcohol, Ethyl (B) <10 <10 mg/dL    Comment: (NOTE) Lowest detectable limit for serum alcohol is 10 mg/dL.  For medical purposes only. Performed at [facility], [address] [place], [place] [address]   Urine Drug Screen, Qualitative [facility] only)     Status: Abnormal   Collection Time: 09/27/22  5:09 PM  Result  Value Ref Range   Tricyclic, Ur Screen POSITIVE (A) NONE DETECTED   Amphetamines, Ur Screen NONE DETECTED NONE DETECTED   MDMA (Ecstasy)Ur Screen NONE DETECTED NONE DETECTED   Cocaine Metabolite,Ur [place] NONE DETECTED NONE DETECTED  Opiate, Ur Screen NONE DETECTED NONE DETECTED   Phencyclidine (PCP) Ur S NONE DETECTED NONE DETECTED   Cannabinoid 50 Ng, Ur [place] POSITIVE (A) NONE DETECTED   Barbiturates, Ur Screen NONE DETECTED NONE DETECTED   Benzodiazepine, Ur Scrn NONE DETECTED NONE DETECTED   Methadone Scn, Ur NONE DETECTED NONE DETECTED    Comment: (NOTE) Tricyclics + metabolites, urine    Cutoff 1000 ng/mL Amphetamines + metabolites, urine  Cutoff 1000 ng/mL MDMA (Ecstasy), urine              Cutoff 500 ng/mL Cocaine Metabolite, urine          Cutoff 300 ng/mL Opiate + metabolites, urine        Cutoff 300 ng/mL Phencyclidine (PCP), urine         Cutoff 25 ng/mL Cannabinoid, urine                 Cutoff 50 ng/mL Barbiturates + metabolites, urine  Cutoff 200 ng/mL Benzodiazepine, urine              Cutoff 200 ng/mL Methadone, urine                   Cutoff 300 ng/mL  The urine drug screen provides only a preliminary, unconfirmed analytical test result and should not be used for non-medical purposes. Clinical consideration and professional judgment should be applied to any positive drug screen result due to possible interfering substances. A more specific alternate chemical method must be used in order to obtain a confirmed analytical result. Gas chromatography / mass spectrometry (GC/MS) is the preferred confirm atory method. Performed at [facility], [address] [place], [place] [address]     Current Facility-Administered Medications  Medication Dose Route Frequency Provider Last Rate Last Admin   benztropine (COGENTIN) tablet 1 mg  1 mg Oral Daily Charm RingsLord, Jamison Y, NP   1 mg at 09/28/22 0956   divalproex (DEPAKOTE ER) 24 hr tablet 750 mg  750 mg Oral QHS Gabriel CirriBarthold,  Raneisha Bress F, NP       haloperidol (HALDOL) tablet 5 mg  5 mg Oral BID Charm RingsLord, Jamison Y, NP   5 mg at 09/28/22 0957   traZODone (DESYREL) tablet 100 mg  100 mg Oral QHS PRN Charm RingsLord, Jamison Y, NP   100 mg at 09/27/22 2134   Current Outpatient Medications  Medication Sig Dispense Refill   amLODipine (NORVASC) 10 MG tablet Take 1 tablet (10 mg total) by mouth daily. (Patient not taking: Reported on 09/02/2022) 30 tablet 0   atorvastatin (LIPITOR) 20 MG tablet Take 1 tablet (20 mg total) by mouth at bedtime. (Patient not taking: Reported on 09/02/2022) 30 tablet 0   benztropine (COGENTIN) 0.5 MG tablet Take 1 tablet (0.5 mg total) by mouth 2 (two) times daily. (Patient not taking: Reported on 09/02/2022) 60 tablet 0   divalproex (DEPAKOTE ER) 250 MG 24 hr tablet Take 3 tablets (750 mg total) by mouth at bedtime. 30 tablet 0   haloperidol (HALDOL) 10 MG tablet Take 1 tablet (10 mg total) by mouth daily at 12 noon. (Patient not taking: Reported on 09/02/2022) 30 tablet 0   haloperidol (HALDOL) 5 MG tablet Take 3 tablets (15 mg total) by mouth at bedtime. (Patient not taking: Reported on 09/02/2022) 90 tablet 0   hydrochlorothiazide (HYDRODIURIL) 12.5 MG tablet Take 1 tablet (12.5 mg total) by mouth daily. (Patient not taking: Reported on 09/02/2022) 30 tablet 0   montelukast (SINGULAIR) 10 MG tablet  Take 1 tablet (10 mg total) by mouth at bedtime. (Patient not taking: Reported on 09/02/2022) 30 tablet 0   propranolol ER (INDERAL LA) 120 MG 24 hr capsule Take 1 capsule (120 mg total) by mouth at bedtime. (Patient not taking: Reported on 09/02/2022) 30 capsule 0    Musculoskeletal: Strength & Muscle Tone: within normal limits Gait & Station: normal Patient leans: N/A            Psychiatric Specialty Exam:  Presentation  General Appearance:  Appropriate for Environment  Eye Contact: Good  Speech: Clear and Coherent  Speech Volume: Normal  Handedness: Right   Mood and Affect   Mood: Euthymic  Affect: Congruent   Thought Process  Thought Processes: Coherent; Linear  Descriptions of Associations:Intact  Orientation:Full (Time, Place and Person)  Thought Content:WDL  History of Schizophrenia/Schizoaffective disorder:Yes  Duration of Psychotic Symptoms:Greater than six months  Hallucinations:Hallucinations: Visual Description of Visual Hallucinations: Shadows/figures  Ideas of Reference:None  Suicidal Thoughts:Suicidal Thoughts: No  Homicidal Thoughts:Homicidal Thoughts: No   Sensorium  Memory: Immediate Fair  Judgment: Fair  Insight: Fair   Community education officer  Concentration: Fair  Attention Span: Fair  Recall: [place] of Knowledge: Fair  Language: Fair   Psychomotor Activity  Psychomotor Activity:Psychomotor Activity: Normal   Assets  Assets: Financial Resources/Insurance; Housing; Desire for Improvement; Physical Health; Resilience   Sleep  Sleep:Sleep: Good   Physical Exam: Physical Exam Vitals and nursing note reviewed.  HENT:     Head: Normocephalic.     Nose: No congestion or rhinorrhea.  Eyes:     General:        Right eye: No discharge.        Left eye: No discharge.  Cardiovascular:     Rate and Rhythm: Normal rate.  Pulmonary:     Effort: Pulmonary effort is normal.  Musculoskeletal:        General: Normal range of motion.  Skin:    General: Skin is dry.  Neurological:     Mental Status: He is alert and oriented to person, place, and time.  Psychiatric:        Attention and Perception: Attention normal.        Mood and Affect: Mood normal.        Speech: Speech normal.        Behavior: Behavior normal.        Thought Content: Thought content normal.        Cognition and Memory: Cognition is impaired.        Judgment: Judgment is impulsive.    Review of Systems  Constitutional: Negative.   Eyes: Negative.   Respiratory: Negative.    Musculoskeletal: Negative.   Skin:  Negative.   Psychiatric/Behavioral:  Positive for substance abuse (Cannabinoids). Negative for suicidal ideas. The patient is not nervous/anxious.        Schizoaffective disorder   Blood pressure 118/65, pulse 84, temperature 98.6 F (37 C), resp. rate 20, height 6\' 2"  (1.88 m), weight 130.2 kg, SpO2 98 %. Body mass index is 36.85 kg/m.  Treatment Plan Summary: Plan Patient does not meet criteria for inpatient psychiatric admission. His Depakote level is 107. Patient has been at his current living facility for about 2 weeks. He admits that he is not happy being in a group home, but does not indicate anything wrong in particular with the placement. Decreased Depakote to 750 mg at bedtime. Contacted patient's group home to let them know he is psychiatrically cleared.  Note: Mother picked patient up.  Prescription for Depakote 750 mg sent to [facility].  Notified mother of the change, she acknowledged change.  Reviewed with EDP  Disposition: No evidence of imminent risk to self or others at present.   Patient does not meet criteria for psychiatric inpatient admission. Supportive therapy provided about ongoing stressors. Discussed crisis plan, support from social network, calling 911, coming to the Emergency Department, and calling Suicide Hotline.  Sherlon Handing, NP 09/28/2022 3:44 PM

## 2022-09-28 NOTE — ED Notes (Signed)
Breakfast given.  

## 2022-11-06 ENCOUNTER — Other Ambulatory Visit: Payer: Self-pay

## 2022-11-06 ENCOUNTER — Emergency Department
Admission: EM | Admit: 2022-11-06 | Discharge: 2022-11-07 | Disposition: A | Discharge status: Other Acute Inpatient Hospital | Payer: No Typology Code available for payment source | Attending: Emergency Medicine | Admitting: Emergency Medicine

## 2022-11-06 DIAGNOSIS — I10 Essential (primary) hypertension: Secondary | ICD-10-CM | POA: Diagnosis not present

## 2022-11-06 DIAGNOSIS — Z20822 Contact with and (suspected) exposure to covid-19: Secondary | ICD-10-CM | POA: Diagnosis not present

## 2022-11-06 DIAGNOSIS — F259 Schizoaffective disorder, unspecified: Secondary | ICD-10-CM | POA: Diagnosis not present

## 2022-11-06 DIAGNOSIS — Z91148 Patient's other noncompliance with medication regimen for other reason: Secondary | ICD-10-CM | POA: Insufficient documentation

## 2022-11-06 DIAGNOSIS — F250 Schizoaffective disorder, bipolar type: Secondary | ICD-10-CM | POA: Diagnosis not present

## 2022-11-06 DIAGNOSIS — E876 Hypokalemia: Secondary | ICD-10-CM | POA: Diagnosis not present

## 2022-11-06 DIAGNOSIS — R259 Unspecified abnormal involuntary movements: Secondary | ICD-10-CM | POA: Diagnosis present

## 2022-11-06 LAB — CBC
HCT: 36.9 % — ABNORMAL LOW (ref 39.0–52.0)
Hemoglobin: 12.4 g/dL — ABNORMAL LOW (ref 13.0–17.0)
MCH: 28 pg (ref 26.0–34.0)
MCHC: 33.6 g/dL (ref 30.0–36.0)
MCV: 83.3 fL (ref 80.0–100.0)
Platelets: 227 10*3/uL (ref 150–400)
RBC: 4.43 MIL/uL (ref 4.22–5.81)
RDW: 14 % (ref 11.5–15.5)
WBC: 6.2 10*3/uL (ref 4.0–10.5)
nRBC: 0 % (ref 0.0–0.2)

## 2022-11-06 LAB — COMPREHENSIVE METABOLIC PANEL
ALT: 30 U/L (ref 0–44)
AST: 21 U/L (ref 15–41)
Albumin: 3.9 g/dL (ref 3.5–5.0)
Alkaline Phosphatase: 66 U/L (ref 38–126)
Anion gap: 7 (ref 5–15)
BUN: 11 mg/dL (ref 6–20)
CO2: 24 mmol/L (ref 22–32)
Calcium: 8.9 mg/dL (ref 8.9–10.3)
Chloride: 104 mmol/L (ref 98–111)
Creatinine, Ser: 1.25 mg/dL — ABNORMAL HIGH (ref 0.61–1.24)
GFR, Estimated: >60 mL/min (ref >60)
Glucose, Bld: 129 mg/dL — ABNORMAL HIGH (ref 70–99)
Potassium: 2.9 mmol/L — ABNORMAL LOW (ref 3.5–5.1)
Sodium: 135 mmol/L (ref 135–145)
Total Bilirubin: 0.9 mg/dL (ref 0.3–1.2)
Total Protein: 7.4 g/dL (ref 6.5–8.1)

## 2022-11-06 LAB — RESP PANEL BY RT-PCR (RSV, FLU A&B, COVID)  RVPGX2
Influenza A by PCR: NEGATIVE
Influenza B by PCR: NEGATIVE
Resp Syncytial Virus by PCR: NEGATIVE
SARS Coronavirus 2 by RT PCR: NEGATIVE

## 2022-11-06 LAB — ACETAMINOPHEN LEVEL: Acetaminophen (Tylenol), Serum: <10 ug/mL — ABNORMAL LOW (ref 10–30)

## 2022-11-06 LAB — SALICYLATE LEVEL: Salicylate Lvl: <7 mg/dL — ABNORMAL LOW (ref 7.0–30.0)

## 2022-11-06 LAB — ETHANOL: Alcohol, Ethyl (B): <10 mg/dL (ref <10)

## 2022-11-06 MED ORDER — AMLODIPINE BESYLATE 5 MG PO TABS
5.0000 mg | ORAL_TABLET | Freq: Every day | ORAL | Status: DC
  Administered 2022-11-06 – 2022-11-07 (×2): 5 mg via ORAL
  Filled 2022-11-06 (×2): qty 1

## 2022-11-06 MED ORDER — POTASSIUM CHLORIDE CRYS ER 20 MEQ PO TBCR
40.0000 meq | EXTENDED_RELEASE_TABLET | Freq: Once | ORAL | Status: AC
  Administered 2022-11-06: 40 meq via ORAL
  Filled 2022-11-06: qty 2

## 2022-11-06 MED ORDER — POTASSIUM CHLORIDE CRYS ER 20 MEQ PO TBCR
20.0000 meq | EXTENDED_RELEASE_TABLET | Freq: Every day | ORAL | Status: DC
  Administered 2022-11-07: 20 meq via ORAL
  Filled 2022-11-06: qty 1

## 2022-11-06 NOTE — ED Provider Notes (Signed)
[facility] Provider Note    Event Date/Time   First MD Initiated Contact with Patient 11/06/22 2015     (approximate)   History   Involuntary commitment  HPI  Gregory Saunders is a 27 y.o. male history of schizoaffective disorder hypertension  Patient reports that for 5 years he has been in and out of mental health hospitals.  He is upset because he is back in his situation.  He advises he takes his medications but he does so very slowly because it takes a long time.  Tells me that he knows that I am living in the spirit world and that he is seeing dead people but "that is normal for me"  No pain or discomfort.  He is eating a sandwich.  He is currently requesting another sandwich.  Watching basketball game acknowledges he is watching a basketball game but at a hospital     Physical Exam   Triage Vital Signs: ED Triage Vitals  Enc Vitals Group     BP 11/06/22 2000 (!) 164/106     Pulse Rate 11/06/22 2000 82     Resp 11/06/22 2000 20     Temp 11/06/22 2000 98.7 F (37.1 C)     Temp Source 11/06/22 2000 Oral     SpO2 11/06/22 2000 98 %     Weight 11/06/22 2001 260 lb (117.9 kg)     Height --      Head Circumference --      Peak Flow --      Pain Score --      Pain Loc --      Pain Edu? --      Excl. in [place]? --     Most recent vital signs: Vitals:   11/06/22 2000  BP: (!) 164/106  Pulse: 82  Resp: 20  Temp: 98.7 F (37.1 C)  SpO2: 98%     General: Awake, no distress.  Pleasant, but flattened affect.  He gets irritated when he talks about having to stay in mental hospitals in the past 5 years.  No desire to harm himself.  Reports the sheriff just showed up and brought him here CV:  Good peripheral perfusion.  Resp:  Normal effort.  Abd:  No distention.  Currently eating sandwich without difficulty Other:  Fully alert, oriented but reports seeing dead people and living in the spirit world.   ED Results / Procedures / Treatments    Labs (all labs ordered are listed, but only abnormal results are displayed) Labs Reviewed  COMPREHENSIVE METABOLIC PANEL - Abnormal; Notable for the following components:      Result Value   Potassium 2.9 (*)    Glucose, Bld 129 (*)    Creatinine, Ser 1.25 (*)    All other components within normal limits  SALICYLATE LEVEL - Abnormal; Notable for the following components:   Salicylate Lvl [id number] (*)    All other components within normal limits  ACETAMINOPHEN LEVEL - Abnormal; Notable for the following components:   Acetaminophen (Tylenol), Serum <10 (*)    All other components within normal limits  CBC - Abnormal; Notable for the following components:   Hemoglobin 12.4 (*)    HCT 36.9 (*)    All other components within normal limits  RESP PANEL BY RT-PCR (RSV, FLU A&B, COVID)  [id number]  ETHANOL  URINE DRUG SCREEN, QUALITATIVE ([facility] ONLY)     EKG     RADIOLOGY     PROCEDURES:  Critical Care performed: No  Procedures   MEDICATIONS ORDERED IN ED: Medications  potassium chloride SA (KLOR-CON M) CR tablet 20 mEq (has no administration in time range)  amLODipine (NORVASC) tablet 5 mg (5 mg Oral Given 11/06/22 2258)  potassium chloride SA (KLOR-CON M) CR tablet 40 mEq (40 mEq Oral Given 11/06/22 2258)     IMPRESSION / MDM / ASSESSMENT AND PLAN / ED COURSE  I reviewed the triage vital signs and the nursing notes.                              Differential diagnosis includes, but is not limited to, exacerbation of schizoaffective disorder, bipolar disorder underlying psychiatric condition etc.  Based on the patient's clinical history and history provided seems to be an underlying exacerbation of psychiatric disorder  Patient's presentation is most consistent with acute complicated illness / injury requiring diagnostic workup.  Patient does not have evidence or reported history of acute medical illness.  He is alert well-oriented but obviously suffering from delusions and  presenting under IVC.  I will continue him under this pending psychiatric consultation which has been requested.  He is currently calm and compliant with medical care request eating meal.    Labs reviewed, reassuring except for hypokalemia for which the patient is asymptomatic and I will replete. Home medication  Blood pressure elevated, will resume blood pressure medication here.  The patient has been placed in psychiatric observation due to the need to provide a safe environment for the patient while obtaining psychiatric consultation and evaluation, as well as ongoing medical and medication management to treat the patient's condition.  The patient has been placed under full IVC at this time.       FINAL CLINICAL IMPRESSION(S) / ED DIAGNOSES   Final diagnoses:  Hypokalemia  Acute exacerbation of chronic schizoaffective schizophrenia ([place])     Rx / DC Orders   ED Discharge Orders     None        Note:  This document was prepared using Dragon voice recognition software and may include unintentional dictation errors.   Delman Kitten, MD 11/06/22 2330

## 2022-11-06 NOTE — ED Notes (Signed)
ED provider in room with patient.

## 2022-11-06 NOTE — ED Notes (Signed)
Pt dressed out into wine scrubs, and wanded by security. In belonging bag: -1 pair pants -1 pair shoes Report given to [place], Therapist, sports. Roomed to 65

## 2022-11-06 NOTE — ED Notes (Signed)
Pt given a sandwich tray and ginger ale.

## 2022-11-06 NOTE — ED Triage Notes (Addendum)
Pt in from apartment complex, under IVC brought in by [facility] PD. Per the notes, pt is noncompliant with meds, has hx of schizophrenia. Per the notes, pt has been making verbal threats to his mother and family members. Making remarks in triage such as, "I am the illuminati. I am the devil, I will kill you", and calling staff "racist crackers" throughout triage. Denies any substance use PTA, denies any SI. Continues to make threatening remarks to staff.

## 2022-11-06 NOTE — ED Notes (Signed)
Patient brought to ED via [place] under IVC.  IVC papers state patient has not been taking his medications.  Patient is verbally aggressive towards staff, denies si.  Refuses to answer specific questions.

## 2022-11-06 NOTE — BH Assessment (Signed)
Comprehensive Clinical Assessment (CCA) Note  11/06/2022 Gregory Saunders [id number]  Chief Complaint: Patient is a 27 year old male presenting to [facility] ED under IVC. Per triage note Pt in from apartment complex, under IVC brought in by [facility] PD. Per the notes, pt is noncompliant with meds, has hx of schizophrenia. Per the notes, pt has been making verbal threats to his mother and family members. Making remarks in triage such as, "I am the illuminati. I am the devil, I will kill you", and calling staff "racist crackers" throughout triage. Denies any substance use PTA, denies any SI. Continues to make threatening remarks to staff. During assessment patient appears alert and oriented x4, cooperative but irritable, paranoid and guarded. Patient reports that he does not understand why he is here "I live in a apartment, there were no complaints, I saw my family and that was it." Patient reports "I'm not going back to a group home, if I do I will legally walk away, I'm the devil." Patient also reports that he only has a primary care physician "I take medications for my physical body, I don't have schizophrenia or any other mental retardation."   Collateral information obtained from patient's mother Darrie Smutny who reports "He threatened me, earlier in the week when I came over to bring him something he kept rolling the window up and demanding money, he said he killed my momma and other family members and he said that I would be next." "I talked to his probation officer and she said that he came in afraid and irate and his officer was afraid that he might hurt someone and I just don't feel like he's safe." "He's not sleeping, he lost his phone, he has all the symptoms of not taking his medications." "He told me that he hasn't slept in 3 months."  Per Psyc NP Anette Riedel patient is recommended for Inpatient Chief Complaint  Patient presents with   Schizophrenia   Visit Diagnosis: Schizoaffective, bipolar  type    CCA Screening, Triage and Referral (STR)  Patient Reported Information How did you hear about [place]? Legal System  Referral name: No data recorded Referral phone number: No data recorded  Whom do you see for routine medical problems? No data recorded Practice/Facility Name: No data recorded Practice/Facility Phone Number: No data recorded Name of Contact: No data recorded Contact Number: No data recorded Contact Fax Number: No data recorded Prescriber Name: No data recorded Prescriber Address (if known): No data recorded  What Is the Reason for Your Visit/Call Today? Pt in from apartment complex, under IVC brought in by [facility] PD. Per the notes, pt is noncompliant with meds, has hx of schizophrenia. Per the notes, pt has been making verbal threats to his mother and family members. Making remarks in triage such as, "I am the illuminati. I am the devil, I will kill you", and calling staff "racist crackers" throughout triage. Denies any substance use PTA, denies any SI. Continues to make threatening remarks to staff.  How Long Has This Been Causing You Problems? > than 6 months  What Do You Feel Would Help You the Most Today? Treatment for Depression or other mood problem   Have You Recently Been in Any Inpatient Treatment (Hospital/Detox/Crisis Center/28-Day Program)? No data recorded Name/Location of Program/Hospital:No data recorded How Long Were You There? No data recorded When Were You Discharged? No data recorded  Have You Ever Received Services From [facility] Before? No data recorded Who Do You See  at [facility]? No data recorded  Have You Recently Had Any Thoughts About Hurting Yourself? No  Are You Planning to Commit Suicide/Harm Yourself At This time? No   Have you Recently Had Thoughts About [place]? No  Explanation: N/A   Have You Used Any Alcohol or Drugs in the Past 24 Hours? No  How Long Ago Did You Use Drugs or Alcohol? No data  recorded What Did You Use and How Much? N/A   Do You Currently Have a Therapist/Psychiatrist? -- (Unknown)  Name of Therapist/Psychiatrist: Patient reports having a primary care provider only "I don't have Schizophrenia or any other mental retardation"   Have You Been Recently Discharged From Any Office Practice or Programs? No  Explanation of Discharge From Practice/Program: Pt discharged from [facility] recently     CCA Screening Triage Referral Assessment Type of Contact: Face-to-Face  Is this Initial or Reassessment? Initial Assessment  Date Telepsych consult ordered in [facility]:  09/01/22  Time Telepsych consult ordered in [facility]:  [place]   Patient Reported Information Reviewed? No data recorded Patient Left Without Being Seen? No data recorded Reason for Not Completing Assessment: No data recorded  Collateral Involvement: None.   Does Patient Have a Stage manager Guardian? No data recorded Name and Contact of Legal Guardian: No data recorded If Minor and Not Living with Parent(s), Who has Custody? n/a  Is CPS involved or ever been involved? Never  Is APS involved or ever been involved? Never   Patient Determined To Be At Risk for Harm To Self or Others Based on Review of Patient Reported Information or Presenting Complaint? No  Method: No Plan  Availability of Means: No access or NA  Intent: Vague intent or NA  Notification Required: No need or identified person  Additional Information for Danger to Others Potential: -- (na)  Additional Comments for Danger to Others Potential: NA  Are There Guns or Other Weapons in Your Home? No  Types of Guns/Weapons: NA  Are These Weapons Safely Secured?                            -- (NA)  Who Could Verify You Are Able To Have These Secured: NA  Do You Have any Outstanding Charges, Pending Court Dates, Parole/Probation? Pt denies.  Contacted To Inform of Risk of Harm To Self or Others: -- (NA)   Location of  Assessment: [facility]   Does Patient Present under Involuntary Commitment? Yes  IVC Papers Initial File Date: No data recorded  [place] of Residence: [place]   Patient Currently Receiving the Following Services: Medication Management; Group Home   Determination of Need: Emergent (2 hours)   Options For Referral: ED Visit     CCA Biopsychosocial Intake/Chief Complaint:  No data recorded Current Symptoms/Problems: No data recorded  Patient Reported Schizophrenia/Schizoaffective Diagnosis in Past: Yes   Strengths: Patient is able to communiate  Preferences: No data recorded Abilities: No data recorded  Type of Services Patient Feels are Needed: No data recorded  Initial Clinical Notes/Concerns: No data recorded  Mental Health Symptoms Depression:   None   Duration of Depressive symptoms:  Greater than two weeks   Mania:   Irritability; Racing thoughts; Recklessness   Anxiety:    Irritability; Restlessness   Psychosis:   Delusions   Duration of Psychotic symptoms:  Greater than six months   Trauma:   None   Obsessions:   Poor  insight   Compulsions:   Absent insight/delusional; Poor Insight   Inattention:   None   Hyperactivity/Impulsivity:   None   Oppositional/Defiant Behaviors:   Temper; Angry   Emotional Irregularity:   Intense/inappropriate anger; Intense/unstable relationships   Other Mood/Personality Symptoms:   Pt presents with depressive symptoms.    Mental Status Exam Appearance and self-care  Stature:   Tall   Weight:   Average weight   Clothing:   Casual   Grooming:   Normal   Cosmetic use:   None   Posture/gait:   Normal   Motor activity:   Not Remarkable   Sensorium  Attention:   Normal   Concentration:   Normal   Orientation:   X5   Recall/memory:   Normal   Affect and Mood  Affect:   Anxious   Mood:   Angry; Irritable   Relating  Eye contact:   Normal   Facial expression:    Responsive; Angry   Attitude toward examiner:   Guarded; Irritable   Thought and Language  Speech flow:  Clear and Coherent   Thought content:   Appropriate to Mood and Circumstances   Preoccupation:   None   Hallucinations:   Other (Comment)   Organization:  No data recorded  [employer] of Knowledge:   Fair   Intelligence:   Average   Abstraction:   Functional   Judgement:   Poor   Reality Testing:   Distorted   Insight:   Lacking; Poor   Decision Making:   Impulsive   Social Functioning  Social Maturity:   Impulsive   Social Judgement:   Heedless   Stress  Stressors:   Family conflict; Financial   Coping Ability:   Exhausted   Skill Deficits:   None   Supports:   Family     Religion: Religion/Spirituality Are You A Religious Person?: No  Leisure/Recreation: Leisure / Recreation Do You Have Hobbies?: No  Exercise/Diet: Exercise/Diet Do You Exercise?: No Have You Gained or Lost A Significant Amount of Weight in the Past Six Months?: No Do You Follow a Special Diet?: No Do You Have Any Trouble Sleeping?: No   CCA Employment/Education Employment/Work Situation: Employment / Work Situation Employment Situation: On disability Why is Patient on Disability: Schizophrenia. How Long has Patient Been on Disability: Since 2018. Patient's Job has Been Impacted by Current Illness: No Has Patient ever Been in the [employer]?: No  Education: Education Last Grade Completed: 12 Did You Attend College?: Yes What Type of College Degree Do you Have?: Pt reports, he completed two years of school but did not graduate. Did You Have An Individualized Education Program (IIEP): No Did You Have Any Difficulty At School?: No   CCA Family/Childhood History Family and Relationship History: Family history Marital status: Single Does patient have children?: No  Childhood History:  Childhood History By whom was/is the patient  raised?: Adoptive parents Did patient suffer any verbal/emotional/physical/sexual abuse as a child?: No Did patient suffer from severe childhood neglect?: No Has patient ever been sexually abused/assaulted/raped as an adolescent or adult?: No Was the patient ever a victim of a crime or a disaster?: No Witnessed domestic violence?: No Has patient been affected by domestic violence as an adult?: No  Child/Adolescent Assessment:     CCA Substance Use Alcohol/Drug Use: Alcohol / Drug Use Pain Medications: See MAR Prescriptions: See MAR Over the Counter: See MAR History of alcohol / drug use?: No history of alcohol /  drug abuse                         ASAM's:  Six Dimensions of Multidimensional Assessment  Dimension 1:  Acute Intoxication and/or Withdrawal Potential:      Dimension 2:  Biomedical Conditions and Complications:      Dimension 3:  Emotional, Behavioral, or Cognitive Conditions and Complications:     Dimension 4:  Readiness to Change:     Dimension 5:  Relapse, Continued use, or Continued Problem Potential:     Dimension 6:  Recovery/Living Environment:     ASAM Severity Score:    ASAM Recommended Level of Treatment:     Substance use Disorder (SUD)    Recommendations for Services/Supports/Treatments:    DSM5 Diagnoses: Patient Active Problem List   Diagnosis Date Noted   Non compliance w medication regimen 09/02/2022   Essential hypertension 06/30/2022   Hyperlipidemia 06/30/2022   Hypothyroidism 06/14/2022   Prediabetes 06/14/2022   Tobacco use disorder 06/14/2022   OSA (obstructive sleep apnea) 01/12/2022   Schizoaffective disorder, bipolar type ([place]) 10/16/2020   Cannabis use disorder 10/16/2020    Patient Centered Plan: Patient is on the following Treatment Plan(s):  Impulse Control   Referrals to Alternative Service(s): Referred to Alternative Service(s):   Place:   Date:   Time:    Referred to Alternative Service(s):   Place:    Date:   Time:    Referred to Alternative Service(s):   Place:   Date:   Time:    Referred to Alternative Service(s):   Place:   Date:   Time:      @BHCOLLABOFCARE$ @  [employer], LCAS-A

## 2022-11-06 NOTE — Consult Note (Signed)
[facility] Psychiatry Consult   Reason for Consult:  Psychiatric evaluation Referring Physician:  Dr. Jacqualine Code Patient Identification: Gregory Saunders MRN:  [id number] Principal Diagnosis: <principal problem not specified> Diagnosis:  Active Problems:   Schizoaffective disorder, bipolar type ([place])   Essential hypertension   Non compliance w medication regimen   Total Time spent with patient: 30 minutes  Subjective:   "I have no idea why I'm here"  HPI:  Gregory Saunders, 27 y.o., male patient seen  by this provider; chart reviewed and consulted with Dr. Jacqualine Code and TTS on 11/06/22.  Per chart review, triage note states, pt in from apartment complex, under IVC brought in by [facility] PD. Per the notes, pt is noncompliant with meds, has hx of schizophrenia. Per the notes, pt has been making verbal threats to his mother and family members. Making remarks in triage such as, "I am the illuminati. I am the devil, I will kill you", and calling staff "racist crackers" throughout triage. Denies any substance use PTA, denies any SI. Continues to make threatening remarks to staff.    Also TTS  has spoken to patients mother as collateral and she states, ""He threatened me, earlier in the week when I came over to bring him something he kept rolling the window up and demanding money, he said he killed my momma and other family members and he said that I would be next." "I talked to his probation officer and she said that he came in afraid and irate and his officer was afraid that he might hurt someone and I just don't feel like he's safe." "He's not sleeping, he lost his phone, he has all the symptoms of not taking his medications." "He told me that he hasn't slept in 3 months."   During evaluation Gregory Saunders is sitting on the bed appearing to be agitated with his current situation.  He is  alert/oriented x 3; agitated/labile; and mood congruent with affect.  Patient is speaking in a clear tone at  moderate volume, and normal pace; with fair eye contact.  His thought process is incoherent and irrelevant; He is currently responding to internal/external stimuli and experiencing delusional thought content.  Patient is delusional and references his self as the devil.  Admits to being medication non compliant.     Recommendation: Inpatient psychiatric hospitalization  Past Psychiatric History: Schizoaffective disorder bipolar type  Risk to Self:   Risk to Others:   Prior Inpatient Therapy:  yes Prior Outpatient Therapy:  yes  Past Medical History:  Past Medical History:  Diagnosis Date   Cannabis use disorder 10/16/2020   Schizophrenia ([place])    Strain of right Achilles tendon 04/07/2019   Tobacco use disorder 06/14/2022    Past Surgical History:  Procedure Laterality Date   stye removal     WISDOM TOOTH EXTRACTION     Family History: No family history on file. Family Psychiatric  History: unknown Social History:  Social History   Substance and Sexual Activity  Alcohol Use Never     Social History   Substance and Sexual Activity  Drug Use Not Currently   Types: Marijuana    Social History   Socioeconomic History   Marital status: Single    Spouse name: Not on file   Number of children: Not on file   Years of education: Not on file   Highest education level: Not on file  Occupational History   Not on file  Tobacco Use   Smoking  status: Every Day    Packs/day: 0.50    Types: Cigarettes   Smokeless tobacco: Never  Vaping Use   Vaping Use: Every day   Substances: CBD  Substance and Sexual Activity   Alcohol use: Never   Drug use: Not Currently    Types: Marijuana   Sexual activity: Not on file  Other Topics Concern   Not on file  Social History Narrative   Not on file   Social Determinants of Health   Financial Resource Strain: Not on file  Food Insecurity: Unknown (06/14/2022)   Hunger Vital Sign    Worried About Running Out of Food in the Last  Year: Patient refused    [place] in the Last Year: Patient refused  Transportation Needs: Unknown (06/14/2022)   PRAPARE - Hydrologist (Medical): Patient refused    Lack of Transportation (Non-Medical): Patient refused  Physical Activity: Not on file  Stress: Not on file  Social Connections: Not on file   Additional Social History:    Allergies:   Allergies  Allergen Reactions   Amoxicillin     Childhood, possible rash    Peanut (Diagnostic) Other (See Comments)   Penicillins Other (See Comments)    Unknown     Labs:  Results for orders placed or performed during the hospital encounter of 11/06/22 (from the past 48 hour(s))  Comprehensive metabolic panel     Status: Abnormal   Collection Time: 11/06/22  8:36 PM  Result Value Ref Range   Sodium 135 135 - 145 mmol/L   Potassium 2.9 (L) 3.5 - 5.1 mmol/L   Chloride 104 98 - 111 mmol/L   CO2 24 22 - 32 mmol/L   Glucose, Bld 129 (H) 70 - 99 mg/dL    Comment: Glucose reference range applies only to samples taken after fasting for at least 8 hours.   BUN 11 6 - 20 mg/dL   Creatinine, Ser 1.25 (H) 0.61 - 1.24 mg/dL   Calcium 8.9 8.9 - 10.3 mg/dL   Total Protein 7.4 6.5 - 8.1 g/dL   Albumin 3.9 3.5 - 5.0 g/dL   AST 21 15 - 41 U/L   ALT 30 0 - 44 U/L   Alkaline Phosphatase 66 38 - 126 U/L   Total Bilirubin 0.9 0.3 - 1.2 mg/dL   GFR, Estimated >60 >60 mL/min    Comment: (NOTE) Calculated using the CKD-EPI Creatinine Equation (2021)    Anion gap 7 5 - 15    Comment: Performed at [facility], [address] [place], [place] [address]  Ethanol     Status: None   Collection Time: 11/06/22  8:36 PM  Result Value Ref Range   Alcohol, Ethyl (B) <10 <10 mg/dL    Comment: (NOTE) Lowest detectable limit for serum alcohol is 10 mg/dL.  For medical purposes only. Performed at [facility], [address] [place], [place] [id number]   Salicylate level     Status: Abnormal    Collection Time: 11/06/22  8:36 PM  Result Value Ref Range   Salicylate Lvl Q000111Q (L) 7.0 - 30.0 mg/dL    Comment: Performed at [facility], [address] [place], [place] [address]  Acetaminophen level     Status: Abnormal   Collection Time: 11/06/22  8:36 PM  Result Value Ref Range   Acetaminophen (Tylenol), Serum <10 (L) 10 - 30 ug/mL    Comment: (NOTE) Therapeutic concentrations vary significantly. A  range of 10-30 ug/mL  may be an effective concentration for many patients. However, some  are best treated at concentrations outside of this range. Acetaminophen concentrations >150 ug/mL at 4 hours after ingestion  and >50 ug/mL at 12 hours after ingestion are often associated with  toxic reactions.  Performed at [facility], [address] [place], [place] [address]   cbc     Status: Abnormal   Collection Time: 11/06/22  8:36 PM  Result Value Ref Range   WBC 6.2 4.0 - 10.5 K/uL   RBC 4.43 4.22 - 5.81 MIL/uL   Hemoglobin 12.4 (L) 13.0 - 17.0 g/dL   HCT 36.9 (L) 39.0 - 52.0 %   MCV 83.3 80.0 - 100.0 fL   MCH 28.0 26.0 - 34.0 pg   MCHC 33.6 30.0 - 36.0 g/dL   RDW 14.0 11.5 - 15.5 %   Platelets 227 150 - 400 K/uL   nRBC 0.0 0.0 - 0.2 %    Comment: Performed at [facility], [address], [place],  [address]    Current Facility-Administered Medications  Medication Dose Route Frequency Provider Last Rate Last Admin   amLODipine (NORVASC) tablet 5 mg  5 mg Oral Daily Delman Kitten, MD       Derrill Memo ON 11/07/2022] potassium chloride SA (KLOR-CON M) CR tablet 20 mEq  20 mEq Oral Daily Delman Kitten, MD       potassium chloride SA (KLOR-CON M) CR tablet 40 mEq  40 mEq Oral Once Delman Kitten, MD       Current Outpatient Medications  Medication Sig Dispense Refill   amLODipine (NORVASC) 10 MG tablet Take 1 tablet (10 mg total) by mouth daily. (Patient not taking: Reported on 09/02/2022) 30 tablet 0   atorvastatin (LIPITOR) 20 MG tablet Take 1  tablet (20 mg total) by mouth at bedtime. (Patient not taking: Reported on 09/02/2022) 30 tablet 0   benztropine (COGENTIN) 0.5 MG tablet Take 1 tablet (0.5 mg total) by mouth 2 (two) times daily. (Patient not taking: Reported on 09/02/2022) 60 tablet 0   divalproex (DEPAKOTE ER) 250 MG 24 hr tablet Take 3 tablets (750 mg total) by mouth at bedtime. 30 tablet 0   haloperidol (HALDOL) 10 MG tablet Take 1 tablet (10 mg total) by mouth daily at 12 noon. (Patient not taking: Reported on 09/02/2022) 30 tablet 0   haloperidol (HALDOL) 5 MG tablet Take 3 tablets (15 mg total) by mouth at bedtime. (Patient not taking: Reported on 09/02/2022) 90 tablet 0   hydrochlorothiazide (HYDRODIURIL) 12.5 MG tablet Take 1 tablet (12.5 mg total) by mouth daily. (Patient not taking: Reported on 09/02/2022) 30 tablet 0   montelukast (SINGULAIR) 10 MG tablet Take 1 tablet (10 mg total) by mouth at bedtime. (Patient not taking: Reported on 09/02/2022) 30 tablet 0   propranolol ER (INDERAL LA) 120 MG 24 hr capsule Take 1 capsule (120 mg total) by mouth at bedtime. (Patient not taking: Reported on 09/02/2022) 30 capsule 0    Musculoskeletal: Strength & Muscle Tone: within normal limits Gait & Station: normal Patient leans: N/A            Psychiatric Specialty Exam:  Presentation  General Appearance:  Bizarre  Eye Contact: Fair  Speech: Normal Rate  Speech Volume: Normal  Handedness: Right   Mood and Affect  Mood: Dysphoric; Labile; Angry  Affect: Inappropriate   Thought Process  Thought Processes: Disorganized  Descriptions of Associations:Circumstantial  Orientation:Partial  Thought Content:Delusions; Illogical;  Paranoid Ideation  History of Schizophrenia/Schizoaffective disorder:Yes  Duration of Psychotic Symptoms:Greater than six months  Hallucinations:Hallucinations: Auditory; Visual Description of Auditory Hallucinations: spirits and demons talk o him Description of Visual  Hallucinations: sees the devil and spirits talk to him  Ideas of Reference:Paranoia  Suicidal Thoughts:Suicidal Thoughts: -- (unable to assess)  Homicidal Thoughts:Homicidal Thoughts: -- (unable to assess)   Sensorium  Memory: Immediate Poor; Remote Poor  Judgment: Impaired  Insight: None   Executive Functions  Concentration: Poor  Attention Span: Poor  Recall: Poor  Fund of Knowledge: Poor  Language: Poor   Psychomotor Activity  Psychomotor Activity: Psychomotor Activity: Normal   Assets  Assets: Housing; Social Support   Sleep  Sleep: Sleep: Poor   Physical Exam: Physical Exam ROS Blood pressure (!) 164/106, pulse 82, temperature 98.7 F (37.1 C), temperature source Oral, resp. rate 20, weight 117.9 kg, SpO2 98 %. Body mass index is 33.38 kg/m.  Treatment Plan Summary: Gregory Saunders was admitted to [facility] for crisis management, and stabilization. Routine labs ordered, which include  Lab Orders         Resp panel by RT-PCR (RSV, Flu A&B, Covid) Anterior Nasal Swab         Comprehensive metabolic panel         Ethanol         Salicylate level         Acetaminophen level         cbc         Urine Drug Screen, Qualitative    Medication Management: Medications started  amLODipine  5 mg Oral Daily   [START ON 11/07/2022] potassium chloride  20 mEq Oral Daily   potassium chloride  40 mEq Oral Once   Will maintain observation checks every 15 minutes for safety. Psychosocial education regarding self-care; social and communication  Patient will be admitted to inpatient psychiatric unit when medically cleared and bed becomes available. Orders are already inplace   Disposition: Recommend psychiatric Inpatient admission when medically cleared. Supportive therapy provided about ongoing stressors. Discussed crisis plan, support from social network, calling 911, coming to the Emergency Department, and calling Suicide Hotline.  Deloria Lair, NP 11/06/2022 10:47 PM

## 2022-11-06 NOTE — ED Notes (Signed)
Psych NP in to speak with patient.

## 2022-11-06 NOTE — ED Notes (Signed)
IVC prior to arrival/ Consult ordered/Pending

## 2022-11-06 NOTE — ED Provider Notes (Signed)
Ongoing care assigned to Dr. Tamala Julian.  Patient pending psychiatry consult   Delman Kitten, MD 11/06/22 2330

## 2022-11-07 NOTE — BH Assessment (Signed)
PATIENT BED AVAILABLE AFTER 7AM ON 11/07/22  Patient has been accepted to [facility].  Accepting physician is Dr. Juliann Mule.  Call report to [phone].  Representative was [place].   ER Staff is aware of it:  [facility] ER Secretary  Dr. Tamala Julian, [place] Patient's Nurse

## 2022-11-07 NOTE — ED Notes (Signed)
Veronica Therapist, sports at [facility] first stated they could not take pt with potassium of 2.9 last night (which was treated with PO potassium). Had CN speak with her. She then called back and said she spoke with their PA who said "just this time" pt would be accepted with low potassium because transport was already here, but did not specify which potassium level would require a redraw prior to transferring. Obtained VS and gave AM meds and had ED secretary print updated MAR to send with pt.

## 2022-11-07 NOTE — ED Notes (Signed)
Unable to reach RN. Tried third number from recording: [phone]. No answer. Pt was already accepted last night by Dr Juliann Mule per notes. CN made aware unable to reach RN.

## 2022-11-07 NOTE — ED Notes (Signed)
Called pager number again. Left ascom number then called back and left voicemail with ascom number.

## 2022-11-07 NOTE — ED Notes (Signed)
IVC/Consult Completed/ Accepted to [facility] in Am

## 2022-11-07 NOTE — ED Notes (Signed)
Tried to call report at different number (found online: [phone]). After 2 tries, able to reach operator. Waiting to speak to RN. ED secretary is arranging transport.

## 2022-11-07 NOTE — ED Notes (Signed)
Tried to call report to RN at [facility] (per notes, call report after 0700 today). No answer.

## 2022-11-07 NOTE — ED Notes (Signed)
Tried to call again on all numbers provided by ED secretary. [phone] (no answer then busy signal), [phone] (pager number provided by operator who claims that RN is busy taking report from "all the other hospitals that are in front of you" and they will "call back". Also tried [phone] again. No luck reaching RN. Pt was accepted last night by Dr Juliann Mule and bed was confirmed.

## 2022-11-07 NOTE — ED Notes (Signed)
Left message for someone to call us back about tranferring  Gregory Saunders to [facility]

## 2022-11-07 NOTE — ED Notes (Signed)
Tried to call report to [facility] at [phone]. No answer.

## 2022-11-07 NOTE — BH Assessment (Signed)
Per [facility], no available beds at [facility] or [facility], refer patient to additional facilities   Referral information for Psychiatric Hospitalization faxed to;   [facility] [phone]- [phone]) no beds available  [facility] [phone]- [phone]),   [facility] ([phone]),  [address] [phone]),   [place] [phone] -or- [phone]),   [facility] [phone])  [facility] [phone])

## 2022-11-07 NOTE — ED Notes (Signed)
EMTALA reviewed by charge RN 

## 2024-02-17 ENCOUNTER — Emergency Department
Admission: EM | Admit: 2024-02-17 | Discharge: 2024-02-21 | Disposition: A | Discharge status: Other Acute Inpatient Hospital | Payer: MEDICAID | Attending: Emergency Medicine | Admitting: Emergency Medicine

## 2024-02-17 ENCOUNTER — Encounter: Payer: Self-pay | Admitting: *Deleted

## 2024-02-17 DIAGNOSIS — F29 Unspecified psychosis not due to a substance or known physiological condition: Secondary | ICD-10-CM | POA: Diagnosis not present

## 2024-02-17 DIAGNOSIS — F250 Schizoaffective disorder, bipolar type: Secondary | ICD-10-CM | POA: Diagnosis not present

## 2024-02-17 DIAGNOSIS — R451 Restlessness and agitation: Secondary | ICD-10-CM | POA: Diagnosis present

## 2024-02-17 DIAGNOSIS — Z79899 Other long term (current) drug therapy: Secondary | ICD-10-CM | POA: Diagnosis not present

## 2024-02-17 LAB — COMPREHENSIVE METABOLIC PANEL WITH GFR
ALT: 68 U/L — ABNORMAL HIGH (ref 0–44)
AST: 32 U/L (ref 15–41)
Albumin: 4.1 g/dL (ref 3.5–5.0)
Alkaline Phosphatase: 81 U/L (ref 38–126)
Anion gap: 7 (ref 5–15)
BUN: 11 mg/dL (ref 6–20)
CO2: 24 mmol/L (ref 22–32)
Calcium: 8.8 mg/dL — ABNORMAL LOW (ref 8.9–10.3)
Chloride: 105 mmol/L (ref 98–111)
Creatinine, Ser: 1.03 mg/dL (ref 0.61–1.24)
GFR, Estimated: >60 mL/min (ref >60)
Glucose, Bld: 67 mg/dL — ABNORMAL LOW (ref 70–99)
Potassium: 4 mmol/L (ref 3.5–5.1)
Sodium: 136 mmol/L (ref 135–145)
Total Bilirubin: 0.7 mg/dL (ref 0.0–1.2)
Total Protein: 7.5 g/dL (ref 6.5–8.1)

## 2024-02-17 LAB — CBC
HCT: 40.9 % (ref 39.0–52.0)
Hemoglobin: 13.4 g/dL (ref 13.0–17.0)
MCH: 27.3 pg (ref 26.0–34.0)
MCHC: 32.8 g/dL (ref 30.0–36.0)
MCV: 83.3 fL (ref 80.0–100.0)
Platelets: 240 10*3/uL (ref 150–400)
RBC: 4.91 MIL/uL (ref 4.22–5.81)
RDW: 13.2 % (ref 11.5–15.5)
WBC: 4.7 10*3/uL (ref 4.0–10.5)
nRBC: 0 % (ref 0.0–0.2)

## 2024-02-17 LAB — ETHANOL: Alcohol, Ethyl (B): <15 mg/dL (ref <15)

## 2024-02-17 MED ORDER — LORAZEPAM 2 MG PO TABS
2.0000 mg | ORAL_TABLET | Freq: Once | ORAL | Status: AC
  Administered 2024-02-17: 2 mg via ORAL
  Filled 2024-02-17: qty 1

## 2024-02-17 MED ORDER — HALOPERIDOL 5 MG PO TABS
5.0000 mg | ORAL_TABLET | Freq: Once | ORAL | Status: AC
  Administered 2024-02-17: 5 mg via ORAL
  Filled 2024-02-17: qty 1

## 2024-02-17 NOTE — ED Notes (Signed)
 Pt to room with legal guardian. Continues to refuse labs/dressing out.  Pt's guardian requesting to leave. Advised to stay until seen by EDP

## 2024-02-17 NOTE — ED Provider Notes (Signed)
 [facility] Provider Note    Event Date/Time   First MD Initiated Contact with Patient 02/17/24 1931     (approximate)   History   Psychiatric Evaluation   HPI  Gregory Saunders is a 28 y.o. male was brought to the emergency department today by legal guardian because of concerns for abnormal behavior.  Patient does have a history of schizophrenia.  Per guardian patient was released today from jail after a 150-day stay.  He apparently had not been getting his medication while in jail so was quite agitated and exhibiting abnormal behavior.  At this point there is concerned about the patient's wellbeing.  Patient is unable to give any significant history.   Physical Exam   Triage Vital Signs: ED Triage Vitals  Encounter Vitals Group     BP 02/17/24 1819 (!) 104/59     Systolic BP Percentile --      Diastolic BP Percentile --      Pulse Rate 02/17/24 1819 (!) 108     Resp 02/17/24 1819 20     Temp 02/17/24 1819 98.5 F (36.9 C)     Temp Source 02/17/24 1819 Oral     SpO2 02/17/24 1819 95 %     Weight 02/17/24 1817 225 lb (102.1 kg)     Height 02/17/24 1817 6\' 6"  (1.981 m)     Head Circumference --      Peak Flow --      Pain Score 02/17/24 1817 0     Pain Loc --      Pain Education --      Exclude from Growth Chart --     Most recent vital signs: Vitals:   02/17/24 1819  BP: (!) 104/59  Pulse: (!) 108  Resp: 20  Temp: 98.5 F (36.9 C)  SpO2: 95%   General: Awake, alert CV:  Good peripheral perfusion.  Resp:  Normal effort.  Abd:  No distention.  Other:  Appears to be responding to internal stimuli.   ED Results / Procedures / Treatments   Labs (all labs ordered are listed, but only abnormal results are displayed) Labs Reviewed  CBC  COMPREHENSIVE METABOLIC PANEL WITH GFR  ETHANOL  URINE DRUG SCREEN, QUALITATIVE ([facility] ONLY)     EKG  None   RADIOLOGY None   PROCEDURES:  Critical Care performed: No   MEDICATIONS  ORDERED IN ED: Medications - No data to display   IMPRESSION / MDM / ASSESSMENT AND PLAN / ED COURSE  I reviewed the triage vital signs and the nursing notes.                              Differential diagnosis includes, but is not limited to, psychiatric illness, drug induced mood disorder.  Patient's presentation is most consistent with acute presentation with potential threat to life or bodily function.   Patient presented to the emergency department today accompanied by legal guardian because of concerns for abnormal behavior in the setting of not having a psychiatric drugs for a number of months.  On exam patient does appear to be responding to internal stimuli.  Will have psychiatry evaluate.  The patient has been placed in psychiatric observation due to the need to provide a safe environment for the patient while obtaining psychiatric consultation and evaluation, as well as ongoing medical and medication management to treat the patient's condition.  The patient has not been  placed under full IVC at this time.      FINAL CLINICAL IMPRESSION(S) / ED DIAGNOSES   Final diagnoses:  Psychosis, unspecified psychosis type (HCC)        Rx / DC Orders    Note:  This document was prepared using Dragon voice recognition software and may include unintentional dictation errors.    Marylynn Soho, MD 02/17/24 2300

## 2024-02-17 NOTE — ED Notes (Signed)
 Pt refusing blood work, urine and dress out in triage.

## 2024-02-17 NOTE — ED Triage Notes (Addendum)
 Pt ambulatory to triage.  Pt got out of jail today after apporx 150 days.  Pt is off his meds.  Pt is Vol pt brought in by legal guardian.  Pt denies SI or HI.  Pt denies drugs or etoh use.  Pt uncooperative.  Hx Schizophrenia

## 2024-02-17 NOTE — ED Notes (Signed)
 Pt's legal guardian attempting to leave department again and again advised them that they have to stay for now.

## 2024-02-17 NOTE — BH Assessment (Signed)
 Comprehensive Clinical Assessment (CCA) Screening, Triage and Referral Note  02/17/2024 Gregory Saunders [id number] Recommendations for Services/Supports/Treatments: Disposition pending [name] is a 28 year old Black male with a hx of cognitive Schizoaffective disorder, bipolar type. Pt is voluntary. Per triage note: Pt ambulatory to triage.  Pt got out of jail today after apporx 150 days.  Pt is off his meds.  Pt is Vol pt brought in by legal guardian.  Pt denies SI or HI.  Pt denies drugs or etoh use.  Pt uncooperative.  Hx Schizophrenia  Upon assessment, Pt was having his snack.  When asked why he'd presented to the ER pt. was unable to formulate a coherent response. Pt was unable to answer any assessment questions appropriately. Pt rambled about being a [place] demon. Pt was preoccupied with being a vampire, with the capability to talk to demons. Pt presented with a euthymic mood and a responsive affect. Pt's thoughts were loose and irrelevant. Pt was disorganized. The pt. had normal psychomotor activity. Patient denied current SI, HI or AV/H. Pt's BAL is unremarkable; UDS is pending.  Chief Complaint:  Chief Complaint  Patient presents with   Psychiatric Evaluation   Visit Diagnosis: Schizoaffective disorder, bipolar type  Patient Reported Information How did you hear about us ? No data recorded What Is the Reason for Your Visit/Call Today? No data recorded How Long Has This Been Causing You Problems? No data recorded What Do You Feel Would Help You the Most Today? No data recorded  Have You Recently Had Any Thoughts About Hurting Yourself? No data recorded Are You Planning to Commit Suicide/Harm Yourself At This time? No data recorded  Have you Recently Had Thoughts About Hurting Someone Marigene Shoulder? No data recorded Are You Planning to Harm Someone at This Time? No data recorded Explanation: No data recorded  Have You Used Any Alcohol or Drugs in the Past 24 Hours? No data  recorded How Long Ago Did You Use Drugs or Alcohol? No data recorded What Did You Use and How Much? No data recorded  Do You Currently Have a Therapist/Psychiatrist? No data recorded Name of Therapist/Psychiatrist: No data recorded  Have You Been Recently Discharged From Any Office Practice or Programs? No data recorded Explanation of Discharge From Practice/Program: No data recorded   CCA Screening Triage Referral Assessment Type of Contact: No data recorded Telemedicine Service Delivery:   Is this Initial or Reassessment?   Date Telepsych consult ordered in [facility]:    Time Telepsych consult ordered in [facility]:    Location of Assessment: No data recorded Provider Location: No data recorded   Collateral Involvement: No data recorded  Does Patient Have a Court Appointed Legal Guardian? No data recorded Name and Contact of Legal Guardian: No data recorded If Minor and Not Living with Parent(s), Who has Custody? No data recorded Is CPS involved or ever been involved? No data recorded Is APS involved or ever been involved? No data recorded  Patient Determined To Be At Risk for Harm To Self or Others Based on Review of Patient Reported Information or Presenting Complaint? No data recorded Method: No data recorded Availability of Means: No data recorded Intent: No data recorded Notification Required: No data recorded Additional Information for Danger to Others Potential: No data recorded Additional Comments for Danger to Others Potential: No data recorded Are There Guns or Other Weapons in Your Home? No data recorded Types of Guns/Weapons: No data recorded Are These Weapons Safely Secured?  No data recorded Who Could Verify You Are Able To Have These Secured: No data recorded Do You Have any Outstanding Charges, Pending Court Dates, Parole/Probation? No data recorded Contacted To Inform of Risk of Harm To Self or Others: No data recorded  Does Patient Present  under Involuntary Commitment? No data recorded   [place] of Residence: No data recorded  Patient Currently Receiving the Following Services: No data recorded  Determination of Need: No data recorded  Options For Referral: No data recorded  Disposition Recommendation per psychiatric provider: Psych consult pending.  Junaid Wurzer R Rosevelt Luu, LCAS

## 2024-02-18 DIAGNOSIS — F250 Schizoaffective disorder, bipolar type: Secondary | ICD-10-CM | POA: Diagnosis not present

## 2024-02-18 MED ORDER — OLANZAPINE 10 MG IM SOLR: 10.0000 mg | Freq: Once | INTRAMUSCULAR | Status: DC | PRN

## 2024-02-18 MED ORDER — DIVALPROEX SODIUM 250 MG PO DR TAB
250.0000 mg | DELAYED_RELEASE_TABLET | Freq: Two times a day (BID) | ORAL | Status: DC
  Administered 2024-02-18 – 2024-02-21 (×7): 250 mg via ORAL
  Filled 2024-02-18 (×7): qty 1

## 2024-02-18 MED ORDER — OLANZAPINE 10 MG PO TBDP
10.0000 mg | ORAL_TABLET | Freq: Three times a day (TID) | ORAL | Status: DC | PRN
  Administered 2024-02-20 – 2024-02-21 (×2): 10 mg via ORAL
  Filled 2024-02-18: qty 1
  Filled 2024-02-18: qty 2

## 2024-02-18 MED ORDER — RISPERIDONE 1 MG PO TABS
1.0000 mg | ORAL_TABLET | Freq: Two times a day (BID) | ORAL | Status: DC
  Administered 2024-02-18 – 2024-02-21 (×7): 1 mg via ORAL
  Filled 2024-02-18 (×7): qty 1

## 2024-02-18 MED ORDER — HYDROXYZINE HCL 25 MG PO TABS
50.0000 mg | ORAL_TABLET | Freq: Four times a day (QID) | ORAL | Status: DC | PRN
  Administered 2024-02-18: 50 mg via ORAL
  Filled 2024-02-18: qty 2

## 2024-02-18 NOTE — ED Notes (Signed)
 VOL CONSULT  DONE  PENDING  PLACEMENT

## 2024-02-18 NOTE — ED Notes (Signed)
 Patient tray did received dinner tray, but is sleeping at this time.

## 2024-02-18 NOTE — ED Provider Notes (Signed)
-----------------------------------------   8:02 PM on 02/18/2024 ----------------------------------------- Psychiatry is working on placement for the patient but states the patient needs to be placed under an IVC.  I have seen the patient reviewed his chart will place the patient under IVC with psychiatry continues to look for inpatient placement.   Ruth Cove, MD 02/18/24 2002

## 2024-02-18 NOTE — ED Notes (Signed)
 Pt given multiple snacks, crackers, peanut butter, ice cream

## 2024-02-18 NOTE — BH Assessment (Signed)
 Per [facility] Loetta Ringer S.), patient to be referred out of system.  Referral information for Psychiatric Hospitalization faxed to;   [facility] [phone]- [phone]), No appropriate bed.   Service Provider Phone  [facility] [phone]  [facility] [phone]  [facility] [phone]  [facility] [phone]  [facility] [phone]  [facility] [phone]  [facility] [phone]  [facility] [phone]  [facility] [phone]  [facility] [phone]  [facility] [phone]  [place] EFAX [phone]  [facility] [phone]  [facility] [phone]  [facility] [phone]

## 2024-02-18 NOTE — ED Notes (Signed)
 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 This RN explained this was not the same medication that was given last night and attempted to answer other questions regarding new medications. Unable to assess if patient understands information, he still appears to be responding to internal stimuli, having disorganized, loose associations, as well as grandiose delusions. Although patient was hesitant, he did take medications and then mumbled incomprehensible words under breath.

## 2024-02-18 NOTE — ED Notes (Signed)
 Patient has been in bathroom since this RN returned from lunch break at 1pm. This RN knocked on door and patient stated "imma come out, Imma come out I'm finishing up now".

## 2024-02-18 NOTE — BH Assessment (Deleted)
 PATIENT BED AVAILABLE AFTER 8AM ON 02/19/24  Patient has been accepted to [facility].  Patient assigned to [address] hall Accepting physician is Dr. Drucie Georgia.  Call report to [phone].  Representative was [employer].   ER Staff is aware of it:  [facility] ER Secretary  Dr. Azalee Bolds, ER MD  Foye Imperial Patient's Nurse     Patient's Family/Support System Roosevelt Coles [phone]) have been updated as well.  EKG WILL NEED TO BE COMPLETED PRIOR TO ARRIVAL AS WELL AS IVC

## 2024-02-18 NOTE — ED Notes (Signed)
Pt provided dinner tray and drink.

## 2024-02-18 NOTE — ED Notes (Signed)
 ivc by MD Paduchowski.

## 2024-02-18 NOTE — BH Assessment (Addendum)
 TTS (Jassime F.) contacted Iris via phone call and secure chat about psych consult.

## 2024-02-18 NOTE — Consult Note (Signed)
 [facility] Consult Note  Patient Name: Gregory Saunders MRN: [id number] DOB: 04-25-1996 DATE OF Consult: 02/18/2024  PRIMARY PSYCHIATRIC DIAGNOSES  1.  Schizoaffective disorder, bipolar type    RECOMMENDATIONS  Recommendations: Medication recommendations: Recommend risperidone  1mg  po BID for psychosis; recommend Depakote  250mg  po BID for mood - monitor LFTs - ALT 68; recommend olanzapine  10mg  po TID PRN moderate agitation; recommend olanzapine  10mg  IM emergent agitation; recommend hydroxyzine  50mg  po q6h PRN anxiety  Non-Medication/therapeutic recommendations: Psychiatric hospitalization  Is inpatient psychiatric hospitalization recommended for this patient? Yes (Explain why): Grossly disorganized, delusional, unable to provide for his own basic needs at this time Follow-Up Telepsychiatry C/L services: We will continue to follow this patient with you until stabilized or discharged.  If you have any questions or concerns, please call our TeleCare Coordination service at  [phone] and ask for myself or the provider on-call. Communication: Treatment team members (and family members if applicable) who were involved in treatment/care discussions and planning, and with whom we spoke or engaged with via secure text/chat, include the following: Dr. Hendrick Locke, Rona Cobia, team via Epic chat  28 year old male with a history of schizoaffective disorder who presents to the ED after 150 days in jail without psychiatric medication, noted to be irritable, labile, with pressured speech, rambling, with delusional thought content, gross thought disorganization. Recommend inpatient psychiatric hospitalization.   Thank you for involving us  in the care of this patient. If you have any additional questions or concerns, please call [phone] and ask for me or the provider on-call.  TELEPSYCHIATRY ATTESTATION & CONSENT  As the provider for this telehealth consult, I attest that I verified the patient's  identity using two separate identifiers, introduced myself to the patient, provided my credentials, disclosed my location, and performed this encounter via a HIPAA-compliant, real-time, face-to-face, two-way, interactive audio and video platform and with the full consent and agreement of the patient (or guardian as applicable.)  Patient physical location: ED in [facility]  Telehealth provider physical location: home office in state of [place]    Video start time: 9:00 AM EST Video end time: 9:10 AM EST   IDENTIFYING DATA  Gregory Saunders is a 28 y.o. year-old male for whom a psychiatric consultation has been ordered by the primary provider. The patient was identified using two separate identifiers.  CHIEF COMPLAINT/REASON FOR CONSULT  Psychosis    HISTORY OF PRESENT ILLNESS (HPI)  Gregory Saunders is a 28 year old male with a history of schizoaffective disorder bipolar type and hypertension who presents to the ED with his guardian after reportedly being released from jail after 150 days not on his psychiatric medication with bizarre behavior. Chart reviewed. Patient given Haldol  5mg  po and Ativan  2mg  po in the ED. Psychiatry consulted for evaluation and management.  On evaluation, patient noted to be irritable, with pressured speech, rambling, and with grossly disorganized thought process. Patient unable to provide coherent history. Patient reports he has diagnoses of bipolar disorder and schizophrenia. He then starts talking about "psychic mediums" and states, "I sold my soul." Patient then states, "I have four kids. I sold my soul for that shit. Adoption." Patient mentions royalty. And then states, "I just got out of jail." And then starts talking about his previous psychiatric hospitalizations. He states, "I told you what I think! Look it up!" Patient denies suicidal and homicidal ideation. 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 You all are fucked. Look at my picture in the  mirror.  Star of [employer]." Patient then starts talking in an incoherent and grossly disorganized fashion "you White people are partying and getting high."   Attempted to call patient's guardian x 2 [phone]), there was no answer and voicemail box is not set up per recording.    PAST PSYCHIATRIC HISTORY  Prior psych meds: Per chart review: Haldol , risperidone , olanzapine , fluphenazine, Depakote , benztropine  Prior psychiatric hospitalizations: Per chart review, yes  Otherwise as per HPI above.  PAST MEDICAL HISTORY  Past Medical History:  Diagnosis Date   Cannabis use disorder 10/16/2020   Schizophrenia (HCC)    Strain of right Achilles tendon 04/07/2019   Tobacco use disorder 06/14/2022     HOME MEDICATIONS  Facility Ordered Medications  Medication   [COMPLETED] haloperidol  (HALDOL ) tablet 5 mg   [COMPLETED] LORazepam  (ATIVAN ) tablet 2 mg   PTA Medications  Medication Sig   divalproex  (DEPAKOTE  ER) 250 MG 24 hr tablet Take 3 tablets (750 mg total) by mouth at bedtime. (Patient not taking: Reported on 11/06/2022)   BANOPHEN 50 MG capsule Take 50 mg by mouth every 6 (six) hours as needed. (Patient not taking: Reported on 02/17/2024)   divalproex  (DEPAKOTE ) 500 MG DR tablet Take 500 mg by mouth 2 (two) times daily. (Patient not taking: Reported on 02/17/2024)   fluPHENAZine (PROLIXIN) 10 MG tablet Take 10 mg by mouth at bedtime. (Patient not taking: Reported on 02/17/2024)     ALLERGIES  Allergies  Allergen Reactions   Amoxicillin     Childhood, possible rash    Peanut (Diagnostic) Other (See Comments)   Penicillins Other (See Comments)    Unknown     SOCIAL & SUBSTANCE USE HISTORY  Social History   Socioeconomic History   Marital status: Single    Spouse name: Not on file   Number of children: Not on file   Years of education: Not on file   Highest education level: Not on file  Occupational History   Not on file  Tobacco Use   Smoking status: Every Day    Current  packs/day: 0.50    Types: Cigarettes   Smokeless tobacco: Never  Vaping Use   Vaping status: Every Day   Substances: CBD  Substance and Sexual Activity   Alcohol use: Never   Drug use: Not Currently    Types: Marijuana   Sexual activity: Not on file  Other Topics Concern   Not on file  Social History Narrative   Not on file   Social Drivers of Health   Financial Resource Strain: Not on file  Food Insecurity: Low Risk  (11/18/2022)   Received from [facility]   Food Insecurity    Within the past 12 months, did the food you bought just not last and you didn't have money to get more?: No    Within the past 12 months, did you worry that your food would run out before you got money to buy more?: No  Transportation Needs: Low Risk  (11/18/2022)   Received from [facility]   Transportation Needs    Within the past 12 months, has a lack of transportation kept you from medical appointments or from doing things needed for daily living?: No  Physical Activity: Not on file  Stress: Not on file  Social Connections: Not on file   Social History   Tobacco Use  Smoking Status Every Day   Current packs/day: 0.50   Types: Cigarettes  Smokeless Tobacco Never   Social History  Substance and Sexual Activity  Alcohol Use Never   Social History   Substance and Sexual Activity  Drug Use Not Currently   Types: Marijuana      FAMILY HISTORY  History reviewed. No pertinent family history. Family Psychiatric History (if known):  Unable to assess due to patient condition    MENTAL STATUS EXAM (MSE)  Mental Status Exam: General Appearance: Casual  Orientation:  Full (Time, Place, and Person)  Memory:  Immediate;   Poor Recent;   Fair Remote;   Poor  Concentration:  Concentration: Poor  Recall:  Poor  Attention  Poor  Eye Contact:  Poor  Speech:  Pressured  Language:  Good  Volume:  Normal  Mood: "Fine"  Affect:  Labile  Thought Process:   Disorganized  Thought Content:  Delusions  Suicidal Thoughts:  No  Homicidal Thoughts:  No  Judgement:  Poor  Insight:  Lacking  Psychomotor Activity:  Normal  Akathisia:  NA  Fund of Knowledge:  Unable to assess due to patient condition     Assets:  Manufacturing systems engineer Social Support  Cognition:  WNL  ADL's:  Intact  AIMS (if indicated):       VITALS  Blood pressure (!) 104/59, pulse (!) 108, temperature 98.5 F (36.9 C), temperature source Oral, resp. rate 20, height 6\' 6"  (1.981 m), weight 102.1 kg, SpO2 95%.  LABS  Admission on 02/17/2024  Component Date Value Ref Range Status   Sodium 02/17/2024 136  135 - 145 mmol/L Final   Potassium 02/17/2024 4.0  3.5 - 5.1 mmol/L Final   Chloride 02/17/2024 105  98 - 111 mmol/L Final   CO2 02/17/2024 24  22 - 32 mmol/L Final   Glucose, Bld 02/17/2024 67 (L)  70 - 99 mg/dL Final   Glucose reference range applies only to samples taken after fasting for at least 8 hours.   BUN 02/17/2024 11  6 - 20 mg/dL Final   Creatinine, Ser 02/17/2024 1.03  0.61 - 1.24 mg/dL Final   Calcium  02/17/2024 8.8 (L)  8.9 - 10.3 mg/dL Final   Total Protein 62/13/0865 7.5  6.5 - 8.1 g/dL Final   Albumin 78/46/9629 4.1  3.5 - 5.0 g/dL Final   AST 52/84/1324 32  15 - 41 U/L Final   ALT 02/17/2024 68 (H)  0 - 44 U/L Final   Alkaline Phosphatase 02/17/2024 81  38 - 126 U/L Final   Total Bilirubin 02/17/2024 0.7  0.0 - 1.2 mg/dL Final   GFR, Estimated 02/17/2024 >60  >60 mL/min Final   Comment: (NOTE) Calculated using the CKD-EPI Creatinine Equation (2021)    Anion gap 02/17/2024 7  5 - 15 Final   Performed at [facility], [address] [place], [place] [address]   Alcohol, Ethyl (B) 02/17/2024 <15  <15 mg/dL Final   Comment: (NOTE) For medical purposes only. Performed at [facility], [address] [place], [place] [address]    WBC 02/17/2024 4.7  4.0 - 10.5 K/uL Final   RBC 02/17/2024 4.91  4.22 - 5.81 MIL/uL Final   Hemoglobin  02/17/2024 13.4  13.0 - 17.0 g/dL Final   HCT 64/40/3474 40.9  39.0 - 52.0 % Final   MCV 02/17/2024 83.3  80.0 - 100.0 fL Final   MCH 02/17/2024 27.3  26.0 - 34.0 pg Final   MCHC 02/17/2024 32.8  30.0 - 36.0 g/dL Final   RDW 25/95/6387 13.2  11.5 - 15.5 % Final   Platelets 02/17/2024  240  150 - 400 K/uL Final   nRBC 02/17/2024 0.0  0.0 - 0.2 % Final   Performed at [facility], [address] [place], [place] [address]    PSYCHIATRIC REVIEW OF SYSTEMS (ROS)  ROS: Notable for the following relevant positive findings: See HPI   Additional findings:      Musculoskeletal: No abnormal movements observed      Gait & Station: Laying/Sitting      Pain Screening: Denies      Nutrition & Dental Concerns: n/a  RISK FORMULATION/ASSESSMENT  Is the patient experiencing any suicidal or homicidal ideations: Yes   Protective factors considered for safety management: Social support  Risk factors/concerns considered for safety management:  Impulsivity Male gender  Is there a safety management plan with the patient and treatment team to minimize risk factors and promote protective factors: Yes           Explain: Psychiatric hospitalization  Is crisis care placement or psychiatric hospitalization recommended: Yes     Based on my current evaluation and risk assessment, patient is determined at this time to be at:  High risk  *RISK ASSESSMENT Risk assessment is a dynamic process; it is possible that this patient's condition, and risk level, may change. This should be re-evaluated and managed over time as appropriate. Please re-consult psychiatric consult services if additional assistance is needed in terms of risk assessment and management. If your team decides to discharge this patient, please advise the patient how to best access emergency psychiatric services, or to call 911, if their condition worsens or they feel unsafe in any way.   Atha Lazar, MD Telepsychiatry Consult  Services

## 2024-02-18 NOTE — ED Notes (Signed)
Vol /psych consult pending 

## 2024-02-18 NOTE — ED Provider Notes (Signed)
 Emergency Medicine Observation Re-evaluation Note  Gregory Saunders is a 28 y.o. male, seen on rounds today.  Pt initially presented to the ED for complaints of Psychiatric Evaluation Currently, the patient is resting, voices no medical complaints.  Physical Exam  BP (!) 104/59 (BP Location: Left Arm)   Pulse (!) 108   Temp 98.5 F (36.9 C) (Oral)   Resp 20   Ht 6\' 6"  (1.981 m)   Wt 102.1 kg   SpO2 95%   BMI 26.00 kg/m  Physical Exam General: Resting in no acute distress Cardiac: No cyanosis Lungs: Equal rise and fall Psych: Not agitated  ED Course / MDM  EKG:   I have reviewed the labs performed to date as well as medications administered while in observation.  Recent changes in the last 24 hours include no events overnight.  Plan  Current plan is for psychiatric disposition.    Fayne Mcguffee J, MD 02/18/24 [phone]

## 2024-02-18 NOTE — ED Notes (Signed)
 Pt given lunch at this time.

## 2024-02-19 LAB — URINE DRUG SCREEN, QUALITATIVE (ARMC ONLY)
Amphetamines, Ur Screen: NOT DETECTED
Barbiturates, Ur Screen: NOT DETECTED
Benzodiazepine, Ur Scrn: NOT DETECTED
Cannabinoid 50 Ng, Ur ~~LOC~~: NOT DETECTED
Cocaine Metabolite,Ur ~~LOC~~: NOT DETECTED
MDMA (Ecstasy)Ur Screen: NOT DETECTED
Methadone Scn, Ur: NOT DETECTED
Opiate, Ur Screen: NOT DETECTED
Phencyclidine (PCP) Ur S: NOT DETECTED
Tricyclic, Ur Screen: NOT DETECTED

## 2024-02-19 NOTE — ED Notes (Signed)
 Hospital meal provided, pt tolerated w/o complaints.  Waste discarded appropriately.

## 2024-02-19 NOTE — ED Notes (Signed)
 Provided pt with breakfast tray and juice. Waste discarded of appropriately.

## 2024-02-19 NOTE — ED Notes (Signed)
 Pt verbally agitated when this RN asked for urine sample stating "Why ya'll keep asking me this stuff?  I been doing this for years! Gimme the damn cup!"  Urine sample provided.

## 2024-02-19 NOTE — ED Notes (Signed)
 Pt given snack tray at this time and a cup of water .

## 2024-02-19 NOTE — ED Notes (Signed)
Patient is IVC pending placement 

## 2024-02-19 NOTE — ED Provider Notes (Signed)
 Emergency Medicine Observation Re-evaluation Note  SAVION WASHAM is a 28 y.o. male, seen on rounds today.  Pt initially presented to the ED for complaints of Psychiatric Evaluation  Currently, the patient is is no acute distress. Denies any concerns at this time.  Physical Exam  Blood pressure 115/78, pulse 70, temperature 97.6 F (36.4 C), temperature source Oral, resp. rate 19, height 6\' 6"  (1.981 m), weight 102.1 kg, SpO2 98%.  Physical Exam: General: No apparent distress Pulm: Normal WOB Neuro: Moving all extremities Psych: Resting comfortably     ED Course / MDM     I have reviewed the labs performed to date as well as medications administered while in observation.  Recent changes in the last 24 hours include: No acute events overnight.  Plan   Current plan: Patient awaiting psychiatric disposition.    Lakeysha Slutsky, Clover Dao, DO 02/19/24 [phone]

## 2024-02-19 NOTE — ED Notes (Signed)
 Pt pacing around in the hall, speaking to self. Pt requests ice cream, RN provided.

## 2024-02-19 NOTE — ED Notes (Signed)
 Pt continues to talk loudly in room. No one in room with pt.

## 2024-02-19 NOTE — ED Notes (Signed)
 Pt noted to be continuously speaking loudly and intermittently laughing in room.

## 2024-02-19 NOTE — ED Notes (Signed)
 Pt pacing,shirtless, and agitated upon assessment with disorganized speech, unintelligible at times,  stating that he did not get ice cream, graham crackers, and peanut butter with his evening snack as requested. Per NT Heidi Llamas, pt received graham crackers and peanut butter.  Pt given ice cream, peanut butter, and graham crackers at this time in attempt to decrease agitation.

## 2024-02-19 NOTE — ED Notes (Signed)
 This tech offered pt shower, pt hesitant then refused.

## 2024-02-20 NOTE — ED Notes (Signed)
 IVC/Pending Placement

## 2024-02-20 NOTE — ED Notes (Signed)
Pt received snack and water. 

## 2024-02-20 NOTE — ED Provider Notes (Signed)
 Emergency Medicine Observation Re-evaluation Note  Gregory Saunders is a 27 y.o. male, seen on rounds today.  Pt initially presented to the ED for complaints of Psychiatric Evaluation Currently, the patient is resting.  Physical Exam  BP 136/85   Pulse (!) 101   Temp 98.2 F (36.8 C) (Oral)   Resp 18   Ht 1.981 m (6\' 6" )   Wt 102.1 kg   SpO2 99%   BMI 26.00 kg/m  Physical Exam Gen:  No acute distress Resp:  Breathing easily and comfortably, no accessory muscle usage Neuro:  Moving all four extremities, no gross focal neuro deficits Psych:  Resting currently, calm when awake  ED Course / MDM  EKG:   I have reviewed the labs performed to date as well as medications administered while in observation.  Recent changes in the last 24 hours include no significant changes.  Plan  Current plan is for psych treatment/disposition.    Lynnda Sas, MD 02/20/24 [phone]

## 2024-02-20 NOTE — ED Notes (Signed)
Meal given to patient 

## 2024-02-20 NOTE — ED Notes (Signed)
 Pt given snack and drink.

## 2024-02-21 NOTE — ED Notes (Signed)
 IVC/Pending Placement

## 2024-02-21 NOTE — ED Notes (Addendum)
 [facility] followed up with referred facilities to check patient's placement status.  Pt has been denied to several facilities due to pt's recent charges and recent release from jail. In addition, pt has also been declined due to low/no acuity with no appropriate bed availability.      [place], [facility] [phone]

## 2024-02-21 NOTE — ED Provider Notes (Addendum)
 Emergency Medicine Observation Re-evaluation Note  Gregory Saunders is a 28 y.o. male, seen on rounds today.  Pt initially presented to the ED for complaints of Psychiatric Evaluation Currently, the patient is resting comfortably.  Physical Exam  BP 131/82 (BP Location: Left Arm)   Pulse 78   Temp 98.2 F (36.8 C) (Oral)   Resp 17   Ht 6\' 6"  (1.981 m)   Wt 102.1 kg   SpO2 96%   BMI 26.00 kg/m  Physical Exam General: No acute distress  ED Course / MDM  EKG:   I have reviewed the labs performed to date as well as medications administered while in observation.  Recent changes in the last 24 hours include no acute events.  Plan  Current plan is for psychiatric placement.    Kandee Orion, MD 02/21/24 0809  Addendum: Patient accepted to [facility]  Pending transportation at end of shift.   Kandee Orion, MD 02/21/24 [phone]

## 2024-02-21 NOTE — ED Notes (Signed)
 Pt given lunch tray.

## 2024-02-21 NOTE — ED Notes (Addendum)
 Patient observed pacing around day room and patient room.  Patient states being hungry, explained that he would be receiving breakfast soon.

## 2024-02-21 NOTE — ED Notes (Signed)
 Patient continues to pace around room and appears to be having a conversation with self and becoming loud.  See MAR.

## 2024-02-21 NOTE — ED Notes (Signed)
Pt provided with snack at this time  

## 2024-02-21 NOTE — BH Assessment (Signed)
 Referral information for Psychiatric Hospitalization faxed to;  [facility] [phone]  [facility] [phone]  [facility] [phone]  [facility] [phone]  [facility] [phone]  [facility] [phone]  [facility] [phone]  [facility] [phone]  [place] [facility] [phone]  [facility] [phone]  [facility] [phone]  [facility] [phone]  [facility] [phone]  [facility] [phone]  [facility] [phone]

## 2024-02-21 NOTE — ED Notes (Signed)
Patient received a snack at this time.  

## 2024-02-21 NOTE — ED Notes (Signed)
 EMTALA reviewed by this RN, pt ready for transport, pt under IVC, no transfer consent obtained at this time.

## 2024-02-21 NOTE — BH Assessment (Signed)
 Patient has been accepted to [facility] on today 02/21/24 pending transportation. Patient was not assigned to room.  Accepting physician is Dr. Lavona Pounds.  Call report to [phone], option 2.  Representative was [employer]   ER Staff is aware of it:  Edwina Gram, ER Secretary  Dr. Karlynn Oyster, ER MD  Maida Sciara, Patient's Nurse     Patient's Family/Support System Roosevelt [phone]- Legal guardian) has been updated as well.

## 2024-07-31 ENCOUNTER — Emergency Department
Admission: EM | Admit: 2024-07-31 | Discharge: 2024-08-01 | Disposition: A | Payer: MEDICAID | Attending: Emergency Medicine | Admitting: Emergency Medicine

## 2024-07-31 ENCOUNTER — Other Ambulatory Visit: Payer: Self-pay

## 2024-07-31 ENCOUNTER — Encounter: Payer: Self-pay | Admitting: Emergency Medicine

## 2024-07-31 DIAGNOSIS — F209 Schizophrenia, unspecified: Secondary | ICD-10-CM | POA: Diagnosis present

## 2024-07-31 DIAGNOSIS — F17200 Nicotine dependence, unspecified, uncomplicated: Secondary | ICD-10-CM | POA: Insufficient documentation

## 2024-07-31 LAB — COMPREHENSIVE METABOLIC PANEL WITH GFR
ALT: 25 U/L (ref 0–44)
AST: 20 U/L (ref 15–41)
Albumin: 4.1 g/dL (ref 3.5–5.0)
Alkaline Phosphatase: 51 U/L (ref 38–126)
Anion gap: 11 (ref 5–15)
BUN: 11 mg/dL (ref 6–20)
CO2: 24 mmol/L (ref 22–32)
Calcium: 9.3 mg/dL (ref 8.9–10.3)
Chloride: 103 mmol/L (ref 98–111)
Creatinine, Ser: 1.33 mg/dL — ABNORMAL HIGH (ref 0.61–1.24)
GFR, Estimated: >60 mL/min (ref >60)
Glucose, Bld: 145 mg/dL — ABNORMAL HIGH (ref 70–99)
Potassium: 3.6 mmol/L (ref 3.5–5.1)
Sodium: 138 mmol/L (ref 135–145)
Total Bilirubin: 1.4 mg/dL — ABNORMAL HIGH (ref 0.0–1.2)
Total Protein: 8 g/dL (ref 6.5–8.1)

## 2024-07-31 LAB — CBC
HCT: 42.1 % (ref 39.0–52.0)
Hemoglobin: 14 g/dL (ref 13.0–17.0)
MCH: 27.5 pg (ref 26.0–34.0)
MCHC: 33.3 g/dL (ref 30.0–36.0)
MCV: 82.5 fL (ref 80.0–100.0)
Platelets: 278 K/uL (ref 150–400)
RBC: 5.1 MIL/uL (ref 4.22–5.81)
RDW: 12.8 % (ref 11.5–15.5)
WBC: 5 K/uL (ref 4.0–10.5)
nRBC: 0 % (ref 0.0–0.2)

## 2024-07-31 LAB — URINE DRUG SCREEN, QUALITATIVE (ARMC ONLY)
Amphetamines, Ur Screen: NOT DETECTED
Barbiturates, Ur Screen: NOT DETECTED
Benzodiazepine, Ur Scrn: NOT DETECTED
Cannabinoid 50 Ng, Ur ~~LOC~~: NOT DETECTED
Cocaine Metabolite,Ur ~~LOC~~: NOT DETECTED
MDMA (Ecstasy)Ur Screen: NOT DETECTED
Methadone Scn, Ur: NOT DETECTED
Opiate, Ur Screen: NOT DETECTED
Phencyclidine (PCP) Ur S: NOT DETECTED
Tricyclic, Ur Screen: NOT DETECTED

## 2024-07-31 LAB — ETHANOL: Alcohol, Ethyl (B): <15 mg/dL (ref <15)

## 2024-07-31 MED ORDER — PALIPERIDONE ER 3 MG PO TB24
6.0000 mg | ORAL_TABLET | Freq: Every day | ORAL | Status: DC
Start: 2024-08-01 — End: 2024-08-04

## 2024-07-31 MED ORDER — BENZTROPINE MESYLATE 1 MG PO TABS
1.0000 mg | ORAL_TABLET | ORAL | Status: AC
  Administered 2024-07-31: 1 mg via ORAL
  Filled 2024-07-31: qty 1

## 2024-07-31 MED ORDER — ALPRAZOLAM 0.5 MG PO TABS
0.5000 mg | ORAL_TABLET | Freq: Once | ORAL | Status: AC
  Administered 2024-07-31: 0.5 mg via ORAL
  Filled 2024-07-31: qty 1

## 2024-07-31 MED ORDER — HALOPERIDOL 5 MG PO TABS
10.0000 mg | ORAL_TABLET | Freq: Once | ORAL | Status: AC
  Administered 2024-07-31: 10 mg via ORAL
  Filled 2024-07-31: qty 2

## 2024-07-31 NOTE — ED Notes (Signed)
 Pt speaking with provider on Ipad at this time.

## 2024-07-31 NOTE — ED Notes (Signed)
Report called to shelby rn

## 2024-07-31 NOTE — Consult Note (Addendum)
 [facility] Consult Note  Patient Name: Gregory Saunders MRN: [id number] DOB: 07-19-1996 DATE OF Consult: 07/31/2024  PRIMARY PSYCHIATRIC DIAGNOSES  1.  Schizophrenia-unspecified type 2.   3.    RECOMMENDATIONS   Inpatient psychiatric admission recommended: yes  If yes: Pt. meets involuntary commitment criteria if not voluntary but do to guardianship does not currently need to be placed on official involuntary status as his guardian is amenable to disposition plan.  Medication recommendations: invega  6mg  po qPM first dose tomorrow as he got haldol  earlier today.  If unavailable would recommend haldol  10mg  po at bedtime.  Haldol /ativan  5mg /2mg  po/IM if refused q6hrs prn agitation not to exceed 20mg  haldol  daily.  Check EKG and hold antipsychotics if QTC >500.  Observation recommendations: routine, increase to 1:1 if agitated  Non-Medication recommendations: supportive care  Treatment team members and family members if applicable with whom risk formulation and management and other related findings were reviewed: ER attending Dr. Dicky, guardian, pt.'s mother via phone.  Thank you for involving us  in the care of this patient. We will continue to follow him with you.  If you have any additional questions or concerns, please call [phone] and ask for me or the provider on-call.  I personally spent a total of 50 minutes in the care of the patient today including preparing to see the patient, getting/reviewing separately obtained history, performing a medically appropriate exam/evaluation, counseling and educating, referring and communicating with other health care professionals, documenting clinical information in the EHR, independently interpreting results, communicating results, and coordinating care.   TELEPSYCHIATRY ATTESTATION & CONSENT  As the provider for this telehealth consult, I attest that I verified the patients identity using two separate identifiers, introduced myself to  the patient, provided my credentials, disclosed my location, and performed this encounter via a HIPAA-compliant, real-time, face-to-face, two-way, interactive audio and video platform and with the full consent and agreement of the patient (or guardian as applicable.)  Patient physical location: ED at [facility] hospital Telehealth provider physical location: home office in state of [place]  Video start time: 1636 (Central Time) Video end time: 1707 (Central Time)  IDENTIFYING DATA  Gregory Saunders is a 28 y.o. year-old male for whom a psychiatric consultation has been ordered by the primary provider. The patient was identified using two separate identifiers.  CHIEF COMPLAINT/REASON FOR CONSULT  Recent discharge from jail, hx of psychosis  HISTORY OF PRESENT ILLNESS (HPI)  The patient is a 28 y/o BM with well-documented history of schizophrenia-unspecified type (suspect disorganized).  He has state appointed guardian who reports that he was recently released from jail and had decompensated somewhat due to not being on medications for up to 4 months while he was incarcerated.  Guardian has group home lined up for him but wants to avoid further decompensation and get his medication restarted.  His mother reports he has bipolar as well as schizophrenia.  Pt. is irritable and is a poor historian, doesn't recall names of any current medications, intermittently seem to be responding to internal stimuli, is not oriented to date.  He reports he is unsure what this talk of a group home is about and that he is looking forward to restarting college to play ball which is likely a delusional belief.  His ability to engage in the interview is severely impaired.  He denies S/H ideation or A/V hallucinations and has no other concerns at this time.  PAST PSYCHIATRIC HISTORY  He reports previous medication trials but cannot  recall names, reports none were particularly.  He reports he's been admitted  psychiatrically before but cannot give timeline or circumstances.  He denies suicide attempts in the past.  Per mother, he has taken amlodipine , depakote  250mg , haldol , prolixin (possibly long-acting injectable), invega .  She reports various efficacy to these but feels invega  was the most effective.   Otherwise as per HPI above.  PAST MEDICAL HISTORY  Past Medical History:  Diagnosis Date   Cannabis use disorder 10/16/2020   Schizophrenia (HCC)    Strain of right Achilles tendon 04/07/2019   Tobacco use disorder 06/14/2022     HOME MEDICATIONS  Facility Ordered Medications  Medication   [COMPLETED] haloperidol  (HALDOL ) tablet 10 mg   [COMPLETED] benztropine  (COGENTIN ) tablet 1 mg   [COMPLETED] ALPRAZolam  (XANAX ) tablet 0.5 mg   PTA Medications  Medication Sig   amLODipine  (NORVASC ) 10 MG tablet Take 1 tablet (10 mg total) by mouth daily.   atorvastatin  (LIPITOR) 20 MG tablet Take 1 tablet (20 mg total) by mouth at bedtime.   benztropine  (COGENTIN ) 0.5 MG tablet Take 1 tablet (0.5 mg total) by mouth 2 (two) times daily. (Patient not taking: Reported on 09/02/2022)   propranolol  ER (INDERAL  LA) 120 MG 24 hr capsule Take 1 capsule (120 mg total) by mouth at bedtime. (Patient not taking: Reported on 09/02/2022)   montelukast  (SINGULAIR ) 10 MG tablet Take 1 tablet (10 mg total) by mouth at bedtime.   haloperidol  (HALDOL ) 10 MG tablet Take 1 tablet (10 mg total) by mouth daily at 12 noon. (Patient not taking: Reported on 11/06/2022)   divalproex  (DEPAKOTE  ER) 250 MG 24 hr tablet Take 3 tablets (750 mg total) by mouth at bedtime. (Patient not taking: Reported on 11/06/2022)   BANOPHEN  50 MG capsule Take 50 mg by mouth every 6 (six) hours as needed. (Patient not taking: Reported on 02/17/2024)   divalproex  (DEPAKOTE ) 500 MG DR tablet Take 500 mg by mouth 2 (two) times daily. (Patient not taking: Reported on 02/17/2024)   fluPHENAZine (PROLIXIN) 10 MG tablet Take 10 mg by mouth at bedtime. (Patient not  taking: Reported on 02/17/2024)    ALLERGIES  Allergies  Allergen Reactions   Peanut (Diagnostic) Other (See Comments)   Penicillins Other (See Comments)    Unknown    Amoxicillin Other (See Comments)    Childhood Reaction     SOCIAL & SUBSTANCE USE HISTORY  Social History   Socioeconomic History   Marital status: Single    Spouse name: Not on file   Number of children: Not on file   Years of education: Not on file   Highest education level: Not on file  Occupational History   Not on file  Tobacco Use   Smoking status: Every Day    Current packs/day: 0.50    Types: Cigarettes   Smokeless tobacco: Never  Vaping Use   Vaping status: Every Day   Substances: CBD  Substance and Sexual Activity   Alcohol use: Never   Drug use: Not Currently    Types: Marijuana   Sexual activity: Not on file  Other Topics Concern   Not on file  Social History Narrative   Not on file   Social Drivers of Health   Financial Resource Strain: Not on file  Food Insecurity: Low Risk  (11/18/2022)   Received from [facility]   Food Insecurity    Within the past 12 months, did the food you bought just not last and you didn't have  money to get more?: No    Within the past 12 months, did you worry that your food would run out before you got money to buy more?: No  Transportation Needs: Low Risk  (11/18/2022)   Received from [facility]   Transportation Needs    Within the past 12 months, has a lack of transportation kept you from medical appointments or from doing things needed for daily living?: No  Physical Activity: Not on file  Stress: Not on file  Social Connections: Not on file   Social History   Tobacco Use  Smoking Status Every Day   Current packs/day: 0.50   Types: Cigarettes  Smokeless Tobacco Never   Social History   Substance and Sexual Activity  Alcohol Use Never   Social History   Substance and Sexual Activity  Drug Use Not Currently    Types: Marijuana    Additional pertinent information: currently homeless, recently released from jail  FAMILY HISTORY  History reviewed. No pertinent family history. Family Psychiatric History (if known): reports he doesn't know family history, states he's adopted.  MENTAL STATUS EXAM (MSE)  Mental Status Exam: General Appearance: Disheveled  Orientation:  Other:  oriented to person, place, not time or situation  Memory:  Immediate;   Fair Recent;   Fair Remote;   Fair  Concentration:  Concentration: Fair and Attention Span: Fair  Recall:  Fair  Attention  Fair  Eye Contact:  Fair  Speech:  Clear and Coherent  Language:  Fair  Volume:  Normal  Mood: irritable  Affect:  Constricted  Thought Process:  Disorganized  Thought Content:  Illogical and delusions  Suicidal Thoughts:  No  Homicidal Thoughts:  No  Judgement:  Impaired  Insight:  Lacking  Psychomotor Activity:  Restlessness  Akathisia:  No  Fund of Knowledge:  Fair    Assets:  Social Support  Cognition:  WNL  ADL's:  Intact  AIMS (if indicated):       VITALS  Blood pressure (!) 152/101, pulse 81, temperature 98.4 F (36.9 C), temperature source Oral, resp. rate 15, weight 81.6 kg, SpO2 100%.  LABS  Admission on 07/31/2024  Component Date Value Ref Range Status   Sodium 07/31/2024 138  135 - 145 mmol/L Final   Potassium 07/31/2024 3.6  3.5 - 5.1 mmol/L Final   Chloride 07/31/2024 103  98 - 111 mmol/L Final   CO2 07/31/2024 24  22 - 32 mmol/L Final   Glucose, Bld 07/31/2024 145 (H)  70 - 99 mg/dL Final   Glucose reference range applies only to samples taken after fasting for at least 8 hours.   BUN 07/31/2024 11  6 - 20 mg/dL Final   Creatinine, Ser 07/31/2024 1.33 (H)  0.61 - 1.24 mg/dL Final   Calcium  07/31/2024 9.3  8.9 - 10.3 mg/dL Final   Total Protein 88/96/7974 8.0  6.5 - 8.1 g/dL Final   Albumin 88/96/7974 4.1  3.5 - 5.0 g/dL Final   AST 88/96/7974 20  15 - 41 U/L Final   ALT 07/31/2024 25  0 - 44  U/L Final   Alkaline Phosphatase 07/31/2024 51  38 - 126 U/L Final   Total Bilirubin 07/31/2024 1.4 (H)  0.0 - 1.2 mg/dL Final   GFR, Estimated 07/31/2024 >60  >60 mL/min Final   Comment: (NOTE) Calculated using the CKD-EPI Creatinine Equation (2021)    Anion gap 07/31/2024 11  5 - 15 Final   Performed at [facility], [address] [place], [place] [address]   Alcohol, Ethyl (B) 07/31/2024 <15  <15 mg/dL Final   Comment: (NOTE) For medical purposes only. Performed at [facility], [address] [place], [place] [address]    WBC 07/31/2024 5.0  4.0 - 10.5 K/uL Final   RBC 07/31/2024 5.10  4.22 - 5.81 MIL/uL Final   Hemoglobin 07/31/2024 14.0  13.0 - 17.0 g/dL Final   HCT 88/96/7974 42.1  39.0 - 52.0 % Final   MCV 07/31/2024 82.5  80.0 - 100.0 fL Final   MCH 07/31/2024 27.5  26.0 - 34.0 pg Final   MCHC 07/31/2024 33.3  30.0 - 36.0 g/dL Final   RDW 88/96/7974 12.8  11.5 - 15.5 % Final   Platelets 07/31/2024 278  150 - 400 K/uL Final   nRBC 07/31/2024 0.0  0.0 - 0.2 % Final   Performed at [facility], [address] [place], [place] [address]   Tricyclic, Ur Screen 07/31/2024 NONE DETECTED  NONE DETECTED Final   Amphetamines, Ur Screen 07/31/2024 NONE DETECTED  NONE DETECTED Final   MDMA (Ecstasy)Ur Screen 07/31/2024 NONE DETECTED  NONE DETECTED Final   Cocaine Metabolite,Ur [place] 07/31/2024 NONE DETECTED  NONE DETECTED Final   Opiate, Ur Screen 07/31/2024 NONE DETECTED  NONE DETECTED Final   Phencyclidine (PCP) Ur S 07/31/2024 NONE DETECTED  NONE DETECTED Final   Cannabinoid 50 Ng, Ur [place] 07/31/2024 NONE DETECTED  NONE DETECTED Final   Barbiturates, Ur Screen 07/31/2024 NONE DETECTED  NONE DETECTED Final   Benzodiazepine, Ur Scrn 07/31/2024 NONE DETECTED  NONE DETECTED Final   Methadone Scn, Ur 07/31/2024 NONE DETECTED  NONE DETECTED Final   Comment: (NOTE) Tricyclics + metabolites, urine    Cutoff 1000 ng/mL Amphetamines + metabolites, urine   Cutoff 1000 ng/mL MDMA (Ecstasy), urine              Cutoff 500 ng/mL Cocaine Metabolite, urine          Cutoff 300 ng/mL Opiate + metabolites, urine        Cutoff 300 ng/mL Phencyclidine (PCP), urine         Cutoff 25 ng/mL Cannabinoid, urine                 Cutoff 50 ng/mL Barbiturates + metabolites, urine  Cutoff 200 ng/mL Benzodiazepine, urine              Cutoff 200 ng/mL Methadone, urine                   Cutoff 300 ng/mL  The urine drug screen provides only a preliminary, unconfirmed analytical test result and should not be used for non-medical purposes. Clinical consideration and professional judgment should be applied to any positive drug screen result due to possible interfering substances. A more specific alternate chemical method must be used in order to obtain a confirmed analytical result. Gas chromatography / mass spectrometry (GC/MS) is the preferred confirm                          atory method. Performed at [facility], [address], [place], [place] [address]     PSYCHIATRIC REVIEW OF SYSTEMS (ROS)  ROS: Notable for the following relevant positive findings: ROS  Additional findings:      Musculoskeletal: No abnormal movements observed      Gait & Station: Normal      Pain Screening: Denies  Nutrition & Dental Concerns: none noted  RISK FORMULATION/ASSESSMENT  Is the patient experiencing any suicidal or homicidal ideations: No        Protective factors considered for safety management: family support, has guardianship  Risk factors/concerns considered for safety management:  Substance abuse/dependence Impulsivity Barriers to accessing treatment Unwillingness to seek help Male gender Unmarried  Is there a safety management plan with the patient and treatment team to minimize risk factors and promote protective factors: Yes           Explain: inpatient treatment Is crisis care placement or psychiatric hospitalization recommended: Yes      Based on my current evaluation and risk assessment, patient is determined at this time to be at:  High risk  *RISK ASSESSMENT Risk assessment is a dynamic process; it is possible that this patient's condition, and risk level, may change. This should be re-evaluated and managed over time as appropriate. Please re-contact psychiatric consult services if additional assistance is needed in terms of risk assessment and management. If your team decides to discharge this patient, please advise the patient how to best access emergency psychiatric services, or to call 911, if their condition worsens or they feel unsafe in any way.   Bernardino LITTIE Erm, MD Telepsychiatry Consult Services

## 2024-07-31 NOTE — BH Assessment (Addendum)
 Per [facility] Rick B.), patient to be referred out of system.  Referral information for Psychiatric Hospitalization faxed to;   [facility]  [phone]  [facility]  [phone]  [facility]  [phone]  [facility]  [phone]  [facility]  [phone]  [facility]  [phone]  [facility]  [phone]  [facility]  [phone]  [facility]  [phone]  [facility]  [phone]  [place] [facility]  [phone]  [facility]  [phone]  [facility]  [phone]

## 2024-07-31 NOTE — ED Notes (Signed)
 Meal tray provided. Tray checked for potential hazards. Pt denies no other needs at this time.

## 2024-07-31 NOTE — ED Triage Notes (Signed)
 Pt in via POV w/ Vinie Daring, [facility], court appointed Legal Guardian.  Mr. Daring states patient is recently released from [facility] and has a potential Group Home ([facility]) for patient to reside at but facility needs patient medicated and stable in regard to psychiatric behaviors  Patient manic in triage, easily irritated, keeps talking about needing to go to [facility] because he is a land there.  Patient otherwise cooperative at this time.

## 2024-07-31 NOTE — ED Provider Notes (Signed)
 Psychiatry consult reviewed, psychiatry team tomorrow to continue to follow.  Discussed with psychiatrist.  Currently recommending Pt. meets involuntary commitment criteria if not voluntary but do to guardianship does not currently need to be placed on official involuntary status as his guardian is amenable to disposition plan.  Guardian wishes for patient to receive psychiatric care and treatment.  Noted psychiatry as recommended EKG performed for QT monitoring.  I have ordered that.  Dr. Cyrena to follow-up on EKG and furhter ED patient care.     Gregory Anes, MD 08/01/24 [phone]

## 2024-07-31 NOTE — ED Provider Notes (Signed)
 [facility] Provider Note    Event Date/Time   First MD Initiated Contact with Patient 07/31/24 1509     (approximate)   History   Behavioral Evaluation   HPI  Gregory Saunders is a 28 y.o. male   history of schizophrenia.  He is newly appointed guardian Vinie Daring phone #[phone] came and discussed with me.  Patient was released from jail about 2 hours ago.  They have been working and have secured a group home for him in [place], but since he has been in jail he has not been on any of his medication.  He would like him evaluated by psychiatry and will need new prescriptions, medications etc.  If psychiatry feels the patient is stable then he will be more than happy to assist in getting the patient transported to a group home that is ready for him in [place]  Guardian advises the patient has not done anything to harm himself or others, but because he has been in jail for a while not on medication they said his behavior has been a bit elevated and they feel he needs to be seen by psychiatry   Patient himself has no complaint.  Tells me for his whole life people been trying to give him medicine that sometimes given the right medicine sometimes they do not, he does want to get medicine now    Past Medical History:  Diagnosis Date   Cannabis use disorder 10/16/2020   Schizophrenia (HCC)    Strain of right Achilles tendon 04/07/2019   Tobacco use disorder 06/14/2022   Patient has no medical complaint.  Physical Exam   Triage Vital Signs: ED Triage Vitals  Encounter Vitals Group     BP 07/31/24 1456 (!) 152/101     Girls Systolic BP Percentile --      Girls Diastolic BP Percentile --      Boys Systolic BP Percentile --      Boys Diastolic BP Percentile --      Pulse Rate 07/31/24 1456 81     Resp 07/31/24 1456 15     Temp 07/31/24 1456 98.4 F (36.9 C)     Temp Source 07/31/24 1456 Oral     SpO2 07/31/24 1456 100 %     Weight 07/31/24 1458  180 lb (81.6 kg)     Height --      Head Circumference --      Peak Flow --      Pain Score 07/31/24 1457 0     Pain Loc --      Pain Education --      Exclude from Growth Chart --     Most recent vital signs: Vitals:   07/31/24 1456  BP: (!) 152/101  Pulse: 81  Resp: 15  Temp: 98.4 F (36.9 C)  SpO2: 100%     General: Awake, no distress.  He is slightly anxious, pacing in the room back-and-forth but otherwise in no acute distress showing no acute agitation or violent behavior. CV:  Good peripheral perfusion.  Normal tones and rate Resp:  Normal effort.  Full clear sentences Abd:  No distention.  Other:  Affect somewhat flat a little bit agitated off-and-on seems to get easily upset when asked questions and slightly labile mood.   ED Results / Procedures / Treatments   Labs (all labs ordered are listed, but only abnormal results are displayed) Labs Reviewed  COMPREHENSIVE METABOLIC PANEL WITH GFR - Abnormal; Notable  for the following components:      Result Value   Glucose, Bld 145 (*)    Creatinine, Ser 1.33 (*)    Total Bilirubin 1.4 (*)    All other components within normal limits  ETHANOL  CBC  URINE DRUG SCREEN, QUALITATIVE ([facility] ONLY)    PROCEDURES:  Critical Care performed: No  Procedures   MEDICATIONS ORDERED IN ED: Medications  haloperidol  (HALDOL ) tablet 10 mg (10 mg Oral Given 07/31/24 1530)  benztropine  (COGENTIN ) tablet 1 mg (1 mg Oral Given 07/31/24 1530)  ALPRAZolam (XANAX) tablet 0.5 mg (0.5 mg Oral Given 07/31/24 1531)     IMPRESSION / MDM / ASSESSMENT AND PLAN / ED COURSE  I reviewed the triage vital signs and the nursing notes.                              Differential diagnosis includes, but is not limited to, likely need for psychiatric evaluation and medication review.  Consult psychiatry and TTS.  Patient has no acute medical complaint.  Has not been on medicine for quite some time per guardian because he has been in jail.  Will  resume Haldol , Cogentin , and give his a dose of Xanax for calming here as we await psychiatry consult.  Patient is agreeable with this plan.  He denies any acute medical illnesses vital signs reassuring except for hypertension.  He is however anxious pacing someone I think blood pressure elevation may be in part related to situational.  He has no chest pain no medical complaint.  He has an established history of psychiatric disease  Patient's presentation is most consistent with acute complicated illness / injury requiring diagnostic workup.   Labs reviewed, no acute concerns or abnormalities to suggest an acute underlying medical causation.  Patient medically cleared for psychiatric evaluation.  Mild elevation of creatinine, appears consistent with old baselines  The patient has been placed in psychiatric observation due to the need to provide a safe environment for the patient while obtaining psychiatric consultation and evaluation, as well as ongoing medical and medication management to treat the patient's condition.  The patient has not been placed under full IVC at this time.        FINAL CLINICAL IMPRESSION(S) / ED DIAGNOSES   Final diagnoses:  Schizophrenia, unspecified type (HCC)     Rx / DC Orders   ED Discharge Orders     None        Note:  This document was prepared using Dragon voice recognition software and may include unintentional dictation errors.   Dicky Anes, MD 07/31/24 1659

## 2024-07-31 NOTE — ED Notes (Signed)
 VOL pending consult moved to [facility]

## 2024-07-31 NOTE — BH Assessment (Signed)
 Writer placed [facility] consult for patient to be seen.

## 2024-07-31 NOTE — ED Notes (Signed)
 Pt sleeping respirations even and unlabored.  Evening snack not offered at this time.

## 2024-07-31 NOTE — ED Notes (Signed)
 Point of Contact for patient is court appointed Legal Guardian, Vinie Daring.   Mobile #: [phone]

## 2024-07-31 NOTE — ED Notes (Addendum)
 Patient dressed out into hospital provided attire; belongings placed in labeled bag and handed off to [employer], Amy.  Belongings include:  Debit Facilities Manager Black Slides Socks

## 2024-07-31 NOTE — BH Assessment (Signed)
 PATIENT BED AVAILABLE AFTER 9AM ON 08/01/24  Patient has been accepted to [facility].  Patient assigned to [address] [facility] Accepting physician is Dr. Charlena Jumper.  Call report to [phone].  Representative was Int[facility].   ER Staff is aware of it:  [facility] ER Secretary  Dr. Dicky, ER MD  [facility] Patient's Nurse     Attempted to contact patient's legal guardian Vinie Daring [phone] but no answer, a HIPAA complaint voicemail was left to return phone call

## 2024-08-01 NOTE — ED Notes (Signed)
 Received call from [facility] at [facility] confirming placement for pt tomorrow morning. Number to call report in morning [phone].

## 2024-08-01 NOTE — ED Notes (Addendum)
 Patient provided with graham crackers/peanut butter, vanilla ice cream, and juice per request.

## 2024-08-01 NOTE — BH Assessment (Signed)
 Writer received call from patient's legal guardian, Vinie Daring. Informed of patient disposition and provided facility name, address and phone number.

## 2024-08-01 NOTE — ED Notes (Signed)
 Pt transported with safe ride to [employer] at this time. Pt ambulatory, calm, and cooperative. Pt aware of admission and transfer. Report called and all belongings given to safe driver.

## 2024-08-01 NOTE — ED Notes (Signed)
 Meal tray provided. Tray checked for potential hazards. Pt denies no other needs at this time.

## 2024-08-01 NOTE — ED Provider Notes (Signed)
 Emergency Medicine Observation Re-evaluation Note   BP 112/74 (BP Location: Right Arm)   Pulse 85   Temp 98.5 F (36.9 C) (Oral)   Resp 18   Wt 81.6 kg   SpO2 99%   BMI 20.80 kg/m    ED Course / MDM   No reported events during my shift at the time of this note.   Pt is awaiting dispo from consultants   Ginnie Shams MD    Shams Ginnie, MD 08/01/24 [phone]

## 2024-08-01 NOTE — ED Notes (Signed)
[facility] reviewed.
